# Patient Record
Sex: Male | Born: 1985 | Race: Black or African American | Hispanic: No | Marital: Single | Smoking: Never smoker
Health system: Southern US, Community
[De-identification: ages and names within clinical notes are randomized; demographics above are authoritative.]

## PROBLEM LIST (undated history)

## (undated) DIAGNOSIS — F419 Anxiety disorder, unspecified: Secondary | ICD-10-CM

## (undated) DIAGNOSIS — F329 Major depressive disorder, single episode, unspecified: Secondary | ICD-10-CM

## (undated) DIAGNOSIS — I10 Essential (primary) hypertension: Secondary | ICD-10-CM

## (undated) DIAGNOSIS — F32A Depression, unspecified: Secondary | ICD-10-CM

## (undated) DIAGNOSIS — J309 Allergic rhinitis, unspecified: Secondary | ICD-10-CM

## (undated) HISTORY — PX: OTHER SURGICAL HISTORY: SHX169

## (undated) HISTORY — DX: Anxiety disorder, unspecified: F41.9

## (undated) HISTORY — DX: Allergic rhinitis, unspecified: J30.9

## (undated) HISTORY — DX: Depression, unspecified: F32.A

## (undated) HISTORY — DX: Major depressive disorder, single episode, unspecified: F32.9

---
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
## 2004-01-06 ENCOUNTER — Encounter: Admission: RE | Admit: 2004-01-06 | Discharge: 2004-01-06 | Payer: Self-pay | Admitting: Family Medicine

## 2004-04-01 ENCOUNTER — Encounter: Admission: RE | Admit: 2004-04-01 | Discharge: 2004-04-01 | Payer: Self-pay | Admitting: Family Medicine

## 2004-04-22 ENCOUNTER — Encounter: Admission: RE | Admit: 2004-04-22 | Discharge: 2004-04-22 | Payer: Self-pay | Admitting: Family Medicine

## 2004-05-09 ENCOUNTER — Emergency Department (HOSPITAL_COMMUNITY): Admission: EM | Admit: 2004-05-09 | Discharge: 2004-05-09 | Payer: Self-pay | Admitting: Emergency Medicine

## 2004-10-21 ENCOUNTER — Ambulatory Visit: Payer: Self-pay | Admitting: Family Medicine

## 2004-10-22 ENCOUNTER — Ambulatory Visit: Payer: Self-pay | Admitting: Family Medicine

## 2004-11-09 ENCOUNTER — Ambulatory Visit: Payer: Self-pay | Admitting: *Deleted

## 2005-01-20 ENCOUNTER — Emergency Department (HOSPITAL_COMMUNITY): Admission: EM | Admit: 2005-01-20 | Discharge: 2005-01-20 | Payer: Self-pay | Admitting: Emergency Medicine

## 2006-04-19 ENCOUNTER — Ambulatory Visit: Payer: Self-pay | Admitting: Family Medicine

## 2007-02-09 DIAGNOSIS — J309 Allergic rhinitis, unspecified: Secondary | ICD-10-CM | POA: Insufficient documentation

## 2007-02-09 DIAGNOSIS — E669 Obesity, unspecified: Secondary | ICD-10-CM | POA: Insufficient documentation

## 2007-02-09 DIAGNOSIS — J4520 Mild intermittent asthma, uncomplicated: Secondary | ICD-10-CM | POA: Insufficient documentation

## 2007-09-20 ENCOUNTER — Emergency Department (HOSPITAL_COMMUNITY): Admission: EM | Admit: 2007-09-20 | Discharge: 2007-09-20 | Payer: Self-pay | Admitting: Emergency Medicine

## 2008-05-07 ENCOUNTER — Emergency Department (HOSPITAL_COMMUNITY): Admission: EM | Admit: 2008-05-07 | Discharge: 2008-05-07 | Payer: Self-pay | Admitting: Family Medicine

## 2008-06-04 ENCOUNTER — Ambulatory Visit: Payer: Self-pay | Admitting: Family Medicine

## 2008-07-08 ENCOUNTER — Encounter (INDEPENDENT_AMBULATORY_CARE_PROVIDER_SITE_OTHER): Payer: Self-pay | Admitting: *Deleted

## 2009-03-03 ENCOUNTER — Emergency Department (HOSPITAL_COMMUNITY): Admission: EM | Admit: 2009-03-03 | Discharge: 2009-03-04 | Payer: Self-pay | Admitting: Emergency Medicine

## 2010-03-05 ENCOUNTER — Emergency Department (HOSPITAL_COMMUNITY): Admission: EM | Admit: 2010-03-05 | Discharge: 2010-03-05 | Payer: Self-pay | Admitting: Emergency Medicine

## 2010-08-12 ENCOUNTER — Encounter: Payer: Self-pay | Admitting: Family Medicine

## 2010-09-27 ENCOUNTER — Emergency Department (HOSPITAL_COMMUNITY): Admission: EM | Admit: 2010-09-27 | Discharge: 2010-09-27 | Payer: Self-pay | Admitting: Emergency Medicine

## 2010-10-17 ENCOUNTER — Emergency Department (HOSPITAL_COMMUNITY): Admission: EM | Admit: 2010-10-17 | Discharge: 2010-10-17 | Payer: Self-pay | Admitting: Family Medicine

## 2010-10-19 ENCOUNTER — Emergency Department (HOSPITAL_COMMUNITY): Admission: EM | Admit: 2010-10-19 | Discharge: 2010-10-19 | Payer: Self-pay | Admitting: Family Medicine

## 2010-12-11 ENCOUNTER — Encounter: Payer: Self-pay | Admitting: Family Medicine

## 2010-12-11 ENCOUNTER — Ambulatory Visit: Admission: RE | Admit: 2010-12-11 | Discharge: 2010-12-11 | Payer: Self-pay | Source: Home / Self Care

## 2010-12-11 DIAGNOSIS — I10 Essential (primary) hypertension: Secondary | ICD-10-CM | POA: Insufficient documentation

## 2010-12-11 LAB — CONVERTED CEMR LAB
Calcium: 10.4 mg/dL (ref 8.4–10.5)
Creatinine, Ser: 1.29 mg/dL (ref 0.40–1.50)
Glucose, Bld: 107 mg/dL — ABNORMAL HIGH (ref 70–99)
Sodium: 138 meq/L (ref 135–145)

## 2011-01-12 NOTE — Miscellaneous (Signed)
   Clinical Lists Changes  Problems: Changed problem from ASTHMA, UNSPECIFIED (ICD-493.90) to ASTHMA, INTERMITTENT (ICD-493.90) 

## 2011-01-14 NOTE — Assessment & Plan Note (Signed)
Summary: meet new doc/need refills/bmc   Vital Signs:  Patient profile:   25 year old male Height:      72 inches Weight:      293 pounds BMI:     39.88 Temp:     98.2 degrees F oral Pulse rate:   88 / minute BP sitting:   138 / 92  (left arm) Cuff size:   large  Vitals Entered By: Garen Grams LPN (December 11, 2010 3:04 PM) CC: f/u bp, allergies Is Patient Diabetic? No Pain Assessment Patient in pain? yes     Location: headache   CC:  f/u bp and allergies.  History of Present Illness: 1. HTN:  Pt was diagnosed with hypertension a couple of months ago at [facility]  He was started on Bystolic and had been taking it but ran out a couple of weeks ago.  He doesn't check his blood pressure at home regularly.    ROS: denies chest pain, shortness of breath  2. Allergies:  He has had chronic problem with allergies.  He has taken multiple over the counter medicines like Zyrtec, Allegra, and Claritin.  All of them seem to help but he can't afford to take them on a regular basis.  He also used to be on Flonase and that helped.  His main complaints now include itchy eyes, nasal congestion.  ROS: denies sore throat, runny nose, chronic cough  Habits & Providers  Alcohol-Tobacco-Diet     Tobacco Status: never  Current Medications (verified): 1)  Flonase 50 Mcg/act  Susp (Fluticasone Propionate) .... 2 Sprays Each Nostril Daily 2)  Bystolic 5 Mg Tabs (Nebivolol Hcl) .Marland Kitchen.. 1 Tab By Mouth Daily 3)  Cetirizine Hcl 10 Mg Tabs (Cetirizine Hcl) .Marland Kitchen.. 1 Tab By Mouth Daily For Allergies  Allergies: No Known Drug Allergies  Past History:  Past Medical History: HTN Allergies  Social History: Reviewed history from 06/04/2008 and no changes required. lives with mom in [place]; graduated from [employer] May 2004; -tobacco/Etoh/ellicit drugs; 7 total sexual partners; condoms 80% of time;  currently no exercise 50 pounds after high school graduation  Review of Systems       The patient  complains of weight gain.  The patient denies fever, weight loss, and syncope.    Physical Exam  General:  Vitals reviewed.  Obese, no acute distress Eyes:  vision grossly intact.  fundoscopic exam benign. normal conjunctiva Nose:  no sinus pain or pressure.  no nasal discharge Mouth:  OP pink and moist Neck:  supple and no masses.   Lungs:  Normal respiratory effort, chest expands symmetrically. Lungs are clear to auscultation, no crackles or wheezes. Heart:  Normal rate and regular rhythm. S1 and S2 normal without gallop, murmur, click, rub or other extra sounds. Extremities:  No C/C/E Psych:  not anxious appearing and not depressed appearing.     Impression & Recommendations:  Problem # 1:  ESSENTIAL HYPERTENSION, BENIGN (ICD-401.1) Assessment New Advised patient that I would recommend an ACEI for first line treatment for hypertension.  He would rather continue with the Bystolic so will restart that.  F/U in 6 weeks to recheck.  Check BMET today His updated medication list for this problem includes:    Bystolic 5 Mg Tabs (Nebivolol hcl) .Marland Kitchen... 1 tab by mouth daily  Orders: Basic Met-[facility] ([address]08-65784) [facility]- Est  Level 4 (69629)  Problem # 2:  RHINITIS, ALLERGIC (ICD-477.9) Assessment: Deteriorated  Regular use of Zyrtec.  Will also start flonase  to help with the nasal congestion His updated medication list for this problem includes:    Flonase 50 Mcg/act Susp (Fluticasone propionate) .Marland Kitchen... 2 sprays each nostril daily    Cetirizine Hcl 10 Mg Tabs (Cetirizine hcl) .Marland Kitchen... 1 tab by mouth daily for allergies  Orders: [facility]- Est  Level 4 ([address]214)  Complete Medication List: 1)  Flonase 50 Mcg/act Susp (Fluticasone propionate) .... 2 sprays each nostril daily 2)  Bystolic 5 Mg Tabs (Nebivolol hcl) .Marland Kitchen.. 1 tab by mouth daily 3)  Cetirizine Hcl 10 Mg Tabs (Cetirizine hcl) .Marland Kitchen.. 1 tab by mouth daily for allergies  Patient Instructions: 1)  It was nice to meet you today 2)  I will restart  the Bystolic.  If it is expensive let me know because the other medicine that I was telling you about is on the $4 plan 3)  I have also sent in a prescription for Zyrtec and Flonase 4)  Please schedule a follow up appointment in 6 weeks to recheck your blood pressure Prescriptions: FLONASE 50 MCG/ACT  SUSP (FLUTICASONE PROPIONATE) 2 sprays each nostril daily  #1 x 3   Entered and Authorized by:   Angelena Sole MD   Signed by:   Angelena Sole MD on 12/11/2010   Method used:   Electronically to        [facility] [phone]* (retail)       [address]       [place], [place]  [address]       Ph: [phone]       Fax: [phone]   RxID:   [id number] CETIRIZINE HCL 10 MG TABS (CETIRIZINE HCL) 1 tab by mouth daily for allergies  #30 x 3   Entered and Authorized by:   Angelena Sole MD   Signed by:   Angelena Sole MD on 12/11/2010   Method used:   Electronically to        [facility] [phone]* (retail)       [address]       [place], [place]  [address]       Ph: [phone]       Fax: [phone]   RxID:   [id number] BYSTOLIC 5 MG TABS (NEBIVOLOL HCL) 1 tab by mouth daily  #30 x 3   Entered and Authorized by:   Angelena Sole MD   Signed by:   Angelena Sole MD on 12/11/2010   Method used:   Electronically to        [facility] [phone]* (retail)       [address]       [place], [place]  [address]       Ph: [phone]       Fax: [phone]   RxID:   [id number]    Orders Added: 1)  Basic Met-[facility] [[id number]] 2)  [facility]- Est  Level 4 [02585]

## 2011-03-25 LAB — DIFFERENTIAL
Basophils Absolute: 0 10*3/uL (ref 0.0–0.1)
Eosinophils Relative: 0 % (ref 0–5)
Lymphocytes Relative: 13 % (ref 12–46)
Lymphs Abs: 1.1 10*3/uL (ref 0.7–4.0)
Neutro Abs: 6.9 10*3/uL (ref 1.7–7.7)
Neutrophils Relative %: 84 % — ABNORMAL HIGH (ref 43–77)

## 2011-03-25 LAB — POCT CARDIAC MARKERS
CKMB, poc: 1.1 ng/mL (ref 1.0–8.0)
Myoglobin, poc: 128 ng/mL (ref 12–200)
Troponin i, poc: <0.05 ng/mL (ref 0.00–0.09)

## 2011-03-25 LAB — D-DIMER, QUANTITATIVE: D-Dimer, Quant: 0.23 ug/mL-FEU (ref 0.00–0.48)

## 2011-03-25 LAB — CBC
HCT: 44 % (ref 39.0–52.0)
Platelets: 174 10*3/uL (ref 150–400)
WBC: 8.3 10*3/uL (ref 4.0–10.5)

## 2011-04-02 ENCOUNTER — Encounter: Payer: Self-pay | Admitting: Family Medicine

## 2011-04-02 ENCOUNTER — Ambulatory Visit (INDEPENDENT_AMBULATORY_CARE_PROVIDER_SITE_OTHER): Payer: BC Managed Care – PPO | Admitting: Family Medicine

## 2011-04-02 DIAGNOSIS — L02419 Cutaneous abscess of limb, unspecified: Secondary | ICD-10-CM | POA: Insufficient documentation

## 2011-04-02 DIAGNOSIS — L03119 Cellulitis of unspecified part of limb: Secondary | ICD-10-CM

## 2011-04-02 DIAGNOSIS — A6000 Herpesviral infection of urogenital system, unspecified: Secondary | ICD-10-CM | POA: Insufficient documentation

## 2011-04-02 DIAGNOSIS — J45909 Unspecified asthma, uncomplicated: Secondary | ICD-10-CM

## 2011-04-02 DIAGNOSIS — J4540 Moderate persistent asthma, uncomplicated: Secondary | ICD-10-CM

## 2011-04-02 DIAGNOSIS — I10 Essential (primary) hypertension: Secondary | ICD-10-CM

## 2011-04-02 MED ORDER — CARVEDILOL 12.5 MG PO TABS: 12.5000 mg | ORAL_TABLET | Freq: Two times a day (BID) | ORAL | Status: DC

## 2011-04-02 MED ORDER — SULFAMETHOXAZOLE-TRIMETHOPRIM 800-160 MG PO TABS: 1.0000 | ORAL_TABLET | Freq: Two times a day (BID) | ORAL | Status: AC

## 2011-04-02 MED ORDER — FLUTICASONE-SALMETEROL 100-50 MCG/DOSE IN AEPB: 1.0000 | INHALATION_SPRAY | Freq: Two times a day (BID) | RESPIRATORY_TRACT | Status: DC

## 2011-04-02 MED ORDER — ACYCLOVIR 200 MG PO CAPS: 200.0000 mg | ORAL_CAPSULE | Freq: Two times a day (BID) | ORAL | Status: AC

## 2011-04-02 MED ORDER — METHYLPREDNISOLONE SODIUM SUCC 125 MG IJ SOLR
125.0000 mg | Freq: Once | INTRAMUSCULAR | Status: AC
  Administered 2011-04-02: 125 mg via INTRAMUSCULAR

## 2011-04-02 MED ORDER — FLUTICASONE PROPIONATE HFA 110 MCG/ACT IN AERO: 2.0000 | INHALATION_SPRAY | Freq: Two times a day (BID) | RESPIRATORY_TRACT | Status: DC

## 2011-04-02 NOTE — Assessment & Plan Note (Signed)
BP elevated.  He has not been taking the Bystolic because it is too expensive.  He was tolerating a beta-blocker so will switch to Carvedilol.  F/U in 6 weeks
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
## 2011-04-02 NOTE — Patient Instructions (Addendum)
We covered a lot of things today For your asthma, you got a shot of steroids to help open you up.  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

## 2011-04-02 NOTE — Progress Notes (Signed)
  Subjective:    Patient ID: Scott Patterson, male    DOB: 09-16-1986, 25 y.o.   MRN: [id number]  HPI 1. Asthma:  Pt has had increased shortness of breath for the past 3 days.  He finds it difficult to catch his breath.  He has been using his Albuterol inhaler about 3 times a week.  He has not had to use it at night.   It started after he spent some time in close proximity with a couple of dogs.  He has also been around lots of people that smoke recently.  2. Bug bite:  He was bit behind his left knee sometime last week.  It was red, swollen and tender.  That area has since gotten better, but now he has bumps on his right thigh and has had some healing bumps on other parts of his skin  3. Genital bumps:  He has been sexually active with a male who was diagnosed with genital herpes.  He has been taking some of her medicine but it hasn't helped clear the bumps.  The bumps are vesicles on his shaft.  4. HTN:  He hasn't been taking the Bystolic because it is too expensive.  Would like to switch to something cheaper.  He doesn't check his blood pressure at home regularly.   Review of Systems Denies chest pain, pleuritic chest pain.  Denies fevers, chills.  Denies penile discharge, abdominal pain, dysuria.  Denies numbness / weakness.    Objective:   Physical Exam  Constitutional: He is oriented to person, place, and time. He appears well-nourished. No distress.  HENT:  Nose: Nose normal.  Mouth/Throat: Oropharynx is clear and moist. No oropharyngeal exudate.  Eyes: Conjunctivae are normal.  Neck: Normal range of motion. Neck supple. No JVD present. No thyromegaly present.  Cardiovascular: Normal rate, regular rhythm and normal heart sounds.   Pulmonary/Chest: Effort normal. He has no wheezes.       Decreased breath sounds throughout.  No crackles or consolidation.  Abdominal: Soft. He exhibits no distension.  Genitourinary:       Genital vesicles most prominent on shaft of penis    Musculoskeletal: He exhibits no edema.  Lymphadenopathy:    He has no cervical adenopathy.  Neurological: He is alert and oriented to person, place, and time.  Skin: He is not diaphoretic.       Small areas that are red, warm, and tender.  Worse on the right thigh.  Nothing that could be drained.  Psychiatric:       Not anxious          Assessment & Plan:

## 2011-04-02 NOTE — Assessment & Plan Note (Signed)
Exam and history c/w genital herpes.  There is no large vesicle that can be unroofed and cultured.  Will treat outbreak with Acyclovir.

## 2011-05-01 ENCOUNTER — Inpatient Hospital Stay (INDEPENDENT_AMBULATORY_CARE_PROVIDER_SITE_OTHER)
Admission: RE | Admit: 2011-05-01 | Discharge: 2011-05-01 | Disposition: A | Discharge status: Home / Self Care | Payer: BC Managed Care – PPO | Source: Ambulatory Visit | Attending: Family Medicine | Admitting: Family Medicine

## 2011-05-01 DIAGNOSIS — L738 Other specified follicular disorders: Secondary | ICD-10-CM

## 2011-05-01 DIAGNOSIS — L678 Other hair color and hair shaft abnormalities: Secondary | ICD-10-CM

## 2011-10-04 ENCOUNTER — Ambulatory Visit (INDEPENDENT_AMBULATORY_CARE_PROVIDER_SITE_OTHER): Payer: Managed Care, Other (non HMO) | Admitting: Family Medicine

## 2011-10-04 VITALS — BP 126/85 | HR 84 | Temp 98.6°F | Ht 73.0 in | Wt 300.0 lb

## 2011-10-04 DIAGNOSIS — T6391XA Toxic effect of contact with unspecified venomous animal, accidental (unintentional), initial encounter: Secondary | ICD-10-CM

## 2011-10-04 DIAGNOSIS — IMO0002 Reserved for concepts with insufficient information to code with codable children: Secondary | ICD-10-CM | POA: Insufficient documentation

## 2011-10-04 DIAGNOSIS — T148XXA Other injury of unspecified body region, initial encounter: Secondary | ICD-10-CM

## 2011-10-04 DIAGNOSIS — T63441A Toxic effect of venom of bees, accidental (unintentional), initial encounter: Secondary | ICD-10-CM

## 2011-10-04 DIAGNOSIS — T63481A Toxic effect of venom of other arthropod, accidental (unintentional), initial encounter: Secondary | ICD-10-CM

## 2011-10-04 MED ORDER — DIPHENHYDRAMINE HCL 12.5 MG/5ML PO LIQD
25.0000 mg | Freq: Once | ORAL | Status: AC
  Administered 2011-10-04: 25 mg via ORAL

## 2011-10-04 NOTE — Assessment & Plan Note (Signed)
Patient with recent insect bite or sting.   No signs of respiratory distress at this time. No signs of systemic reaction to bite or sting.  Reassured and Given 25 mg of diphenhydramine, warned of drowsiness. Advised of red flags.

## 2011-10-04 NOTE — Patient Instructions (Signed)
It was nice meeting you today.  You may notice some drowsiness from the benadryl.  You can continue benadryl tonight if you having swelling or itching around the area of the sting.  If you notice that you are becoming short of breath or developing swelling or hives along your entire body, please let us know or go to the ER

## 2011-10-04 NOTE — Progress Notes (Signed)
  Subjective:    Patient ID: Nyra Capes, male    DOB: 10/12/1986, 25 y.o.   MRN: [id number]  HPI  1.  Insect sting: Patient comes in today as a walk-in. Was stung by a bee approximately 20-30 minutes prior walking in the clinic. Is concerned because he had a severe reaction to bee sting when he was a child, with shortness of breath And had to get to the hospital. He states he did not see bee. He says he did remove the stiner. He has noticed localized swelling. He felt that he initially noticed some shortness of breath But that has since went away. He has not noticed any swelling elsewhere or any hives.   Review of Systems     Objective:   Physical Exam  Constitutional: He appears well-developed and well-nourished. No distress.  Cardiovascular: Normal rate, regular rhythm and normal heart sounds.   Pulmonary/Chest: Effort normal and breath sounds normal. No respiratory distress. He has no wheezes.  Skin: Skin is warm and dry. No rash noted.       Small erythematous swollen area on L lateral neck.  Area is non-tender and consistent with insect bite/sting          Assessment & Plan:

## 2011-10-13 ENCOUNTER — Encounter: Payer: Self-pay | Admitting: Family Medicine

## 2011-10-13 ENCOUNTER — Ambulatory Visit (INDEPENDENT_AMBULATORY_CARE_PROVIDER_SITE_OTHER): Payer: Managed Care, Other (non HMO) | Admitting: Family Medicine

## 2011-10-13 DIAGNOSIS — H109 Unspecified conjunctivitis: Secondary | ICD-10-CM | POA: Insufficient documentation

## 2011-10-13 DIAGNOSIS — H5710 Ocular pain, unspecified eye: Secondary | ICD-10-CM

## 2011-10-13 MED ORDER — AZELASTINE HCL 0.05 % OP SOLN: 1.0000 [drp] | Freq: Two times a day (BID) | OPHTHALMIC | Status: DC

## 2011-10-13 NOTE — Progress Notes (Signed)
  Subjective:    Patient ID: Scott Patterson, male    DOB: February 20, 1986, 25 y.o.   MRN: [id number]  HPI EYE COMPLAINT  Location: right  Onset: 2 days ago subacute onset of itching and irritation.  No trauma or foreign body.  Minimal pain.  Increased tearing but no yellow discharge  Better with: slightly with otc visine type eye drop   Symptoms Discharge: no Pain: no Photophobia: no Decreased Vision: no URI symptoms: no Itching/Allergy sxs: yes, has lots of allergies and take zyrtec regularly Glaucoma: no Recent eye surgery: no Contact lens use: no  Red Flags Trauma: no Foreign Body: no Vomiting/HA: no Halos around lights: no Chickenpox or zoster: no  PMH - had severe conjunctivitis a few years ago and saw and eye doctor.  No problems since    Review of Symptoms - see HPI  PMH - Smoking status noted.      Review of Systems     Objective:   Physical Exam  R Eye - no discharge, very slight peripheral scleral injection.   PERRL EOMI, iris round and smooth. No pain with light.  No foreign body seen.  Fundi well visualized and normal  Procedure Tetracaine ophthalmic instilled which decreased his irritation.  Flourescein strip used.  No foreign body seen, no corneal uptake. Did see an area on sclera approximately 3 oclock measuring 3 mm of increased uptake with any visible tissue disruption        Assessment & Plan:

## 2011-10-13 NOTE — Patient Instructions (Signed)
Use the eye drops twice daily  Continue to use your zyrtec daily  You should be better in 2-3 days  If you start to lose vision or the eye becomes very painful or you are not better in 3 days or if you have a lot of discharge then come back

## 2011-10-13 NOTE — Assessment & Plan Note (Signed)
His symptoms and exam are consistent with allergic conjunctivitis (primarily itching not pain, minimal discharge, irritation better with tetracaine, no corneal lesions)  The uptake of dye consistent with mild abrasion from rubbing.  Will treat for allergy and follow his symptoms.

## 2011-10-14 ENCOUNTER — Telehealth: Payer: Self-pay | Admitting: Family Medicine

## 2011-10-14 NOTE — Telephone Encounter (Signed)
Called him.  His eye is better no visual symptoms

## 2012-02-05 ENCOUNTER — Emergency Department (HOSPITAL_COMMUNITY)
Admission: EM | Admit: 2012-02-05 | Discharge: 2012-02-05 | Disposition: A | Discharge status: Home / Self Care | Payer: No Typology Code available for payment source | Attending: Emergency Medicine | Admitting: Emergency Medicine

## 2012-02-05 ENCOUNTER — Emergency Department (HOSPITAL_COMMUNITY): Payer: No Typology Code available for payment source

## 2012-02-05 ENCOUNTER — Encounter (HOSPITAL_COMMUNITY): Payer: Self-pay

## 2012-02-05 DIAGNOSIS — S161XXA Strain of muscle, fascia and tendon at neck level, initial encounter: Secondary | ICD-10-CM

## 2012-02-05 DIAGNOSIS — J45909 Unspecified asthma, uncomplicated: Secondary | ICD-10-CM | POA: Insufficient documentation

## 2012-02-05 DIAGNOSIS — S139XXA Sprain of joints and ligaments of unspecified parts of neck, initial encounter: Secondary | ICD-10-CM | POA: Insufficient documentation

## 2012-02-05 DIAGNOSIS — I10 Essential (primary) hypertension: Secondary | ICD-10-CM | POA: Insufficient documentation

## 2012-02-05 DIAGNOSIS — Y92410 Unspecified street and highway as the place of occurrence of the external cause: Secondary | ICD-10-CM | POA: Insufficient documentation

## 2012-02-05 HISTORY — DX: Essential (primary) hypertension: I10

## 2012-02-05 MED ORDER — HYDROCODONE-ACETAMINOPHEN 5-325 MG PO TABS: 1.0000 | ORAL_TABLET | ORAL | Status: AC | PRN

## 2012-02-05 MED ORDER — OXYCODONE-ACETAMINOPHEN 5-325 MG PO TABS
1.0000 | ORAL_TABLET | Freq: Once | ORAL | Status: AC
  Administered 2012-02-05: 1 via ORAL
  Filled 2012-02-05: qty 1

## 2012-02-05 MED ORDER — IBUPROFEN 600 MG PO TABS: 600.0000 mg | ORAL_TABLET | Freq: Three times a day (TID) | ORAL | Status: AC | PRN
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
## 2012-09-07 ENCOUNTER — Ambulatory Visit (INDEPENDENT_AMBULATORY_CARE_PROVIDER_SITE_OTHER): Payer: Managed Care, Other (non HMO) | Admitting: Family Medicine

## 2012-09-07 ENCOUNTER — Other Ambulatory Visit (HOSPITAL_COMMUNITY)
Admission: RE | Admit: 2012-09-07 | Discharge: 2012-09-07 | Disposition: A | Discharge status: Home / Self Care | Payer: Managed Care, Other (non HMO) | Source: Ambulatory Visit | Attending: Family Medicine | Admitting: Family Medicine

## 2012-09-07 ENCOUNTER — Encounter: Payer: Self-pay | Admitting: Family Medicine

## 2012-09-07 VITALS — BP 138/84 | HR 88 | Temp 98.0°F | Ht 74.0 in | Wt 299.2 lb

## 2012-09-07 DIAGNOSIS — Z113 Encounter for screening for infections with a predominantly sexual mode of transmission: Secondary | ICD-10-CM | POA: Insufficient documentation

## 2012-09-07 DIAGNOSIS — R7309 Other abnormal glucose: Secondary | ICD-10-CM

## 2012-09-07 DIAGNOSIS — R358 Other polyuria: Secondary | ICD-10-CM

## 2012-09-07 DIAGNOSIS — R3589 Other polyuria: Secondary | ICD-10-CM

## 2012-09-07 DIAGNOSIS — R300 Dysuria: Secondary | ICD-10-CM

## 2012-09-07 DIAGNOSIS — R739 Hyperglycemia, unspecified: Secondary | ICD-10-CM

## 2012-09-07 LAB — POCT URINALYSIS DIPSTICK
Bilirubin, UA: NEGATIVE
Blood, UA: NEGATIVE
Glucose, UA: NEGATIVE
Spec Grav, UA: 1.015

## 2012-09-07 LAB — POCT GLYCOSYLATED HEMOGLOBIN (HGB A1C): Hemoglobin A1C: 5.9

## 2012-09-07 MED ORDER — CIPROFLOXACIN HCL 500 MG PO TABS: 500.0000 mg | ORAL_TABLET | Freq: Two times a day (BID) | ORAL | Status: DC

## 2012-09-07 NOTE — Patient Instructions (Signed)
Take the Cipro twice a day for the next 7 days.    As for supplements, don't take anything with [place] in it.

## 2012-09-08 DIAGNOSIS — R300 Dysuria: Secondary | ICD-10-CM | POA: Insufficient documentation

## 2012-09-08 NOTE — Assessment & Plan Note (Signed)
GC Chlamydia probe sent. Patient had initially a UA but did NOT wipe with alcohol swab and states this was his initial void. NOT a midstream catch. Therefore we were able to send for STD probe. We'll treat for UA currently.  If no improvement he is to call.  Will call with STD probe results.
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
## 2012-10-17 ENCOUNTER — Ambulatory Visit: Payer: Managed Care, Other (non HMO) | Admitting: Family Medicine

## 2013-01-06 ENCOUNTER — Emergency Department (HOSPITAL_BASED_OUTPATIENT_CLINIC_OR_DEPARTMENT_OTHER)
Admission: EM | Admit: 2013-01-06 | Discharge: 2013-01-06 | Disposition: A | Discharge status: Home / Self Care | Payer: Managed Care, Other (non HMO) | Attending: Emergency Medicine | Admitting: Emergency Medicine

## 2013-01-06 ENCOUNTER — Encounter (HOSPITAL_BASED_OUTPATIENT_CLINIC_OR_DEPARTMENT_OTHER): Payer: Self-pay | Admitting: *Deleted

## 2013-01-06 DIAGNOSIS — Z79899 Other long term (current) drug therapy: Secondary | ICD-10-CM | POA: Insufficient documentation

## 2013-01-06 DIAGNOSIS — J029 Acute pharyngitis, unspecified: Secondary | ICD-10-CM

## 2013-01-06 DIAGNOSIS — J45909 Unspecified asthma, uncomplicated: Secondary | ICD-10-CM | POA: Insufficient documentation

## 2013-01-06 DIAGNOSIS — I10 Essential (primary) hypertension: Secondary | ICD-10-CM | POA: Insufficient documentation

## 2013-01-06 LAB — RAPID STREP SCREEN (MED CTR MEBANE ONLY): Streptococcus, Group A Screen (Direct): NEGATIVE

## 2013-01-06 NOTE — ED Provider Notes (Signed)
History     CSN: [id number]  Arrival date & time 01/06/13  1352   First MD Initiated Contact with Patient 01/06/13 1408      Chief Complaint  Patient presents with  . Sore Throat    (Consider location/radiation/quality/duration/timing/severity/associated sxs/prior treatment) HPI  Patient complaining of sore throat for two days.  He states he had uri symptoms earlier in the week which have resolved.  Patient denies fever, chills, nausea, or vomiting.    Past Medical History  Diagnosis Date  . Asthma   . Hypertension     History reviewed. No pertinent past surgical history.  History reviewed. No pertinent family history.  History  Substance Use Topics  . Smoking status: Never Smoker   . Smokeless tobacco: Not on file  . Alcohol Use: No      Review of Systems  All other systems reviewed and are negative.    Allergies  Review of patient's allergies indicates no known allergies.  Home Medications   Current Outpatient Rx  Name  Route  Sig  Dispense  Refill  . CETIRIZINE HCL 10 MG PO TABS   Oral   Take 10 mg by mouth daily.           Marland Kitchen HYDROCHLOROTHIAZIDE 12.5 MG PO CAPS   Oral   Take 12.5 mg by mouth daily.         Marland Kitchen CARVEDILOL 12.5 MG PO TABS   Oral   Take 12.5 mg by mouth 2 (two) times daily.         Marland Kitchen CIPROFLOXACIN HCL 500 MG PO TABS   Oral   Take 1 tablet (500 mg total) by mouth 2 (two) times daily.   14 tablet   0   . FLUTICASONE PROPIONATE  HFA 110 MCG/ACT IN AERO   Inhalation   Inhale 2 puffs into the lungs 2 (two) times daily as needed. For shortness of breath           BP 123/86  Pulse 86  Temp 98 F (36.7 C) (Oral)  Resp 20  Ht 6\' 2"  (1.88 m)  Wt 279 lb 4 oz (126.667 kg)  BMI 35.85 kg/m2  SpO2 99%  Physical Exam  Nursing note and vitals reviewed. Constitutional: He is oriented to person, place, and time. He appears well-developed and well-nourished.  HENT:  Head: Normocephalic and atraumatic.  Right Ear: External ear  normal.  Left Ear: External ear normal.       Pharyngeal erythema, no exudate or swelling.   Eyes: Conjunctivae normal and EOM are normal. Pupils are equal, round, and reactive to light.  Cardiovascular: Normal rate and regular rhythm.   Pulmonary/Chest: Effort normal and breath sounds normal.  Abdominal: Soft. Bowel sounds are normal.  Musculoskeletal: Normal range of motion.  Neurological: He is alert and oriented to person, place, and time. He has normal reflexes.  Skin: Skin is warm and dry.  Psychiatric: He has a normal mood and affect. His behavior is normal. Judgment and thought content normal.    ED Course  Procedures (including critical care time)   Labs Reviewed  RAPID STREP SCREEN   No results found.   No diagnosis found.   Results for orders placed during the hospital encounter of 01/06/13  RAPID STREP SCREEN      Component Value Range   Streptococcus, Group A Screen (Direct) NEGATIVE  NEGATIVE    MDM  Pharyngitis with uri prodrome and negative strep screen.  Plan conservative therapy with salt  gargles, tylenol, and other symptomatic management.        Hilario Quarry, MD 01/06/13 1440

## 2013-01-06 NOTE — ED Notes (Signed)
Sore throat since yesterday.

## 2013-07-02 ENCOUNTER — Emergency Department (HOSPITAL_BASED_OUTPATIENT_CLINIC_OR_DEPARTMENT_OTHER): Payer: Managed Care, Other (non HMO)

## 2013-07-02 ENCOUNTER — Emergency Department (HOSPITAL_COMMUNITY): Payer: Managed Care, Other (non HMO)

## 2013-07-02 ENCOUNTER — Emergency Department (HOSPITAL_BASED_OUTPATIENT_CLINIC_OR_DEPARTMENT_OTHER)
Admission: EM | Admit: 2013-07-02 | Discharge: 2013-07-02 | Disposition: A | Discharge status: Other Acute Inpatient Hospital | Payer: Managed Care, Other (non HMO) | Attending: Emergency Medicine | Admitting: Emergency Medicine

## 2013-07-02 ENCOUNTER — Encounter (HOSPITAL_BASED_OUTPATIENT_CLINIC_OR_DEPARTMENT_OTHER): Payer: Self-pay | Admitting: *Deleted

## 2013-07-02 ENCOUNTER — Encounter (HOSPITAL_COMMUNITY): Payer: Self-pay | Admitting: Emergency Medicine

## 2013-07-02 ENCOUNTER — Emergency Department (HOSPITAL_COMMUNITY)
Admission: EM | Admit: 2013-07-02 | Discharge: 2013-07-02 | Disposition: A | Discharge status: Home / Self Care | Payer: Managed Care, Other (non HMO) | Source: Home / Self Care | Attending: Emergency Medicine | Admitting: Emergency Medicine

## 2013-07-02 DIAGNOSIS — M6281 Muscle weakness (generalized): Secondary | ICD-10-CM | POA: Insufficient documentation

## 2013-07-02 DIAGNOSIS — IMO0002 Reserved for concepts with insufficient information to code with codable children: Secondary | ICD-10-CM | POA: Insufficient documentation

## 2013-07-02 DIAGNOSIS — S139XXA Sprain of joints and ligaments of unspecified parts of neck, initial encounter: Secondary | ICD-10-CM | POA: Insufficient documentation

## 2013-07-02 DIAGNOSIS — I10 Essential (primary) hypertension: Secondary | ICD-10-CM | POA: Insufficient documentation

## 2013-07-02 DIAGNOSIS — Y92410 Unspecified street and highway as the place of occurrence of the external cause: Secondary | ICD-10-CM | POA: Insufficient documentation

## 2013-07-02 DIAGNOSIS — S0990XA Unspecified injury of head, initial encounter: Secondary | ICD-10-CM | POA: Insufficient documentation

## 2013-07-02 DIAGNOSIS — S0993XA Unspecified injury of face, initial encounter: Secondary | ICD-10-CM | POA: Insufficient documentation

## 2013-07-02 DIAGNOSIS — Y9389 Activity, other specified: Secondary | ICD-10-CM | POA: Insufficient documentation

## 2013-07-02 DIAGNOSIS — S199XXA Unspecified injury of neck, initial encounter: Secondary | ICD-10-CM | POA: Insufficient documentation

## 2013-07-02 DIAGNOSIS — J45909 Unspecified asthma, uncomplicated: Secondary | ICD-10-CM | POA: Insufficient documentation

## 2013-07-02 DIAGNOSIS — Z79899 Other long term (current) drug therapy: Secondary | ICD-10-CM | POA: Insufficient documentation

## 2013-07-02 DIAGNOSIS — S161XXA Strain of muscle, fascia and tendon at neck level, initial encounter: Secondary | ICD-10-CM

## 2013-07-02 DIAGNOSIS — R29898 Other symptoms and signs involving the musculoskeletal system: Secondary | ICD-10-CM

## 2013-07-02 MED ORDER — HYDROCODONE-ACETAMINOPHEN 5-325 MG PO TABS
2.0000 | ORAL_TABLET | Freq: Once | ORAL | Status: AC
  Administered 2013-07-02: 2 via ORAL
  Filled 2013-07-02: qty 2

## 2013-07-02 MED ORDER — HYDROCODONE-ACETAMINOPHEN 5-325 MG PO TABS: 1.0000 | ORAL_TABLET | Freq: Four times a day (QID) | ORAL | Status: DC | PRN
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
## 2013-07-02 NOTE — ED Notes (Signed)
[facility] transporting to [facility] ED for MRI and Dr. Georgiann Mccoy is accepting.

## 2013-07-03 ENCOUNTER — Emergency Department (HOSPITAL_COMMUNITY)
Admission: EM | Admit: 2013-07-03 | Discharge: 2013-07-03 | Disposition: A | Discharge status: Home / Self Care | Payer: Managed Care, Other (non HMO) | Attending: Emergency Medicine | Admitting: Emergency Medicine

## 2013-07-03 ENCOUNTER — Encounter (HOSPITAL_COMMUNITY): Payer: Self-pay | Admitting: Emergency Medicine

## 2013-07-03 DIAGNOSIS — S4980XA Other specified injuries of shoulder and upper arm, unspecified arm, initial encounter: Secondary | ICD-10-CM | POA: Insufficient documentation

## 2013-07-03 DIAGNOSIS — S161XXD Strain of muscle, fascia and tendon at neck level, subsequent encounter: Secondary | ICD-10-CM

## 2013-07-03 DIAGNOSIS — S139XXA Sprain of joints and ligaments of unspecified parts of neck, initial encounter: Secondary | ICD-10-CM | POA: Insufficient documentation

## 2013-07-03 DIAGNOSIS — Y92410 Unspecified street and highway as the place of occurrence of the external cause: Secondary | ICD-10-CM | POA: Insufficient documentation

## 2013-07-03 DIAGNOSIS — I10 Essential (primary) hypertension: Secondary | ICD-10-CM | POA: Insufficient documentation

## 2013-07-03 DIAGNOSIS — IMO0002 Reserved for concepts with insufficient information to code with codable children: Secondary | ICD-10-CM | POA: Insufficient documentation

## 2013-07-03 DIAGNOSIS — J45909 Unspecified asthma, uncomplicated: Secondary | ICD-10-CM | POA: Insufficient documentation

## 2013-07-03 DIAGNOSIS — M549 Dorsalgia, unspecified: Secondary | ICD-10-CM

## 2013-07-03 DIAGNOSIS — Y939 Activity, unspecified: Secondary | ICD-10-CM | POA: Insufficient documentation

## 2013-07-03 DIAGNOSIS — Z79899 Other long term (current) drug therapy: Secondary | ICD-10-CM | POA: Insufficient documentation

## 2013-07-03 DIAGNOSIS — S46909A Unspecified injury of unspecified muscle, fascia and tendon at shoulder and upper arm level, unspecified arm, initial encounter: Secondary | ICD-10-CM | POA: Insufficient documentation

## 2013-07-03 MED ORDER — CYCLOBENZAPRINE HCL 10 MG PO TABS: 10.0000 mg | ORAL_TABLET | Freq: Two times a day (BID) | ORAL | Status: DC | PRN

## 2013-07-03 MED ORDER — IBUPROFEN 800 MG PO TABS: 800.0000 mg | ORAL_TABLET | Freq: Three times a day (TID) | ORAL | Status: DC | PRN
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
## 2013-07-05 NOTE — ED Provider Notes (Signed)
Medical screening examination/treatment/procedure(s) were performed by non-physician practitioner and as supervising physician I was immediately available for consultation/collaboration.   Bernette Seeman, MD 07/05/13 0731 

## 2013-07-06 ENCOUNTER — Encounter (HOSPITAL_BASED_OUTPATIENT_CLINIC_OR_DEPARTMENT_OTHER): Payer: Self-pay | Admitting: *Deleted

## 2013-07-06 ENCOUNTER — Emergency Department (HOSPITAL_BASED_OUTPATIENT_CLINIC_OR_DEPARTMENT_OTHER)
Admission: EM | Admit: 2013-07-06 | Discharge: 2013-07-06 | Disposition: A | Discharge status: Home / Self Care | Payer: Managed Care, Other (non HMO) | Attending: Emergency Medicine | Admitting: Emergency Medicine

## 2013-07-06 DIAGNOSIS — Z79899 Other long term (current) drug therapy: Secondary | ICD-10-CM | POA: Insufficient documentation

## 2013-07-06 DIAGNOSIS — F0781 Postconcussional syndrome: Secondary | ICD-10-CM | POA: Insufficient documentation

## 2013-07-06 DIAGNOSIS — G8911 Acute pain due to trauma: Secondary | ICD-10-CM | POA: Insufficient documentation

## 2013-07-06 DIAGNOSIS — I10 Essential (primary) hypertension: Secondary | ICD-10-CM | POA: Insufficient documentation

## 2013-07-06 DIAGNOSIS — J45909 Unspecified asthma, uncomplicated: Secondary | ICD-10-CM | POA: Insufficient documentation

## 2013-07-06 MED ORDER — HYDROCODONE-ACETAMINOPHEN 5-325 MG PO TABS: 1.0000 | ORAL_TABLET | Freq: Four times a day (QID) | ORAL | Status: DC | PRN

## 2013-07-06 MED ORDER — CYCLOBENZAPRINE HCL 10 MG PO TABS: 10.0000 mg | ORAL_TABLET | Freq: Two times a day (BID) | ORAL | Status: DC | PRN

## 2013-07-06 NOTE — ED Provider Notes (Signed)
History/physical exam/procedure(s) were performed by non-physician practitioner and as supervising physician I was immediately available for consultation/collaboration. I have reviewed all notes and am in agreement with care and plan.   Hilario Quarry, MD 07/06/13 [phone]
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
## 2014-01-10 ENCOUNTER — Encounter (HOSPITAL_COMMUNITY): Payer: Self-pay

## 2014-01-10 ENCOUNTER — Encounter (HOSPITAL_COMMUNITY): Payer: Managed Care, Other (non HMO) | Attending: Psychiatry | Admitting: Psychiatry

## 2014-01-10 DIAGNOSIS — F411 Generalized anxiety disorder: Secondary | ICD-10-CM

## 2014-01-10 DIAGNOSIS — F331 Major depressive disorder, recurrent, moderate: Secondary | ICD-10-CM

## 2014-01-10 MED ORDER — PAROXETINE HCL ER 25 MG PO TB24: 25.0000 mg | ORAL_TABLET | Freq: Every day | ORAL | Status: DC

## 2014-01-10 NOTE — Progress Notes (Unsigned)
    Daily Group Progress Note  Program: {CHL AMB BH IOP/CDIOP Program Type:21022744}  Group Time:   Participation Level: {CHL AMB BH Group Participation:21022742}  Behavioral Response: {CHL AMB BH Group Behavior:21022743}  Type of Therapy:  {CHL AMB BH Type of Therapy:21022741}  Summary of Progress: ***     Group Time:   Participation Level:  {CHL AMB BH Group Participation:21022742}  Behavioral Response: {CHL AMB BH Group Behavior:21022743}  Type of Therapy: {CHL AMB BH Type of Therapy:21022741}  Summary of Progress: ***  [facility] 

## 2014-01-10 NOTE — Progress Notes (Signed)
Patient ID: Scott CapesRemus L Preble, male   DOB: 10-Jun-1986, 28 y.o.   MRN: [id number] D:  This is a 28 yr old, single, African American, male who was referred per Dr. Evelene CroonKaur, treatment for worsening depressive and anxiety symptoms.  Denies SI, HI, or A/V hallucinations.  Admits to paranoid thoughts.  Reports that he feels that people are talking about him.  Denies any previous suicide attempts or gestures.  No prior inpatient psychiatric hospitalizations.  Has seen Dr. Evelene CroonKaur and therapist Garry Heater([facility]) for four visits.  Adds he has seen a therapist previously years ago.  Family Hx:  Father has a long hx of drug and ETOH use, Paternal Uncle struggles with ETOH, and mother suffers with depression.  Triggers/Stressors:  1)  Job ([employer]) of three years.  Pt works within [employer].  He states it's difficult working with the public.  "We get a portfolio and is required to keep in contact with our customers.  I have had a lady to tell me that she will shoot herself so that I can hear it, if I called her again."  Patient hasn't worked since the end of October 2014.  2)  Financial Strain  3)  Conflictual Relationship with father.  "My dad has never been involved in my life."  4)  Mother:  Pt states he worries about her because she has multiple medical issues.  5)  Medical Issues of his own:  HTN and Asthma Childhood:  [place]  Denies any abuse.  "I had to become a man at a young age."  Parents never married.  States that mother worked all the time in order to provide for them.  Pt states he had a lot of responsibilities.  "I attended school, but didn't put much effort in it.  I can remember going to school worrying about bills when I was in the seventh and eighth grade."  Pt did graduate from high school and attended college for one year. Siblings:  Younger brother.  Recently moved out of mother's home. Kids:  Two daughters (ages 2 and 756). Reports that his mother is his support  system. Denies any drugs or ETOH issues currently or in the past.  No cigarettes. Denies any legal issues. Pt completed all forms.  Scored 29 on the burns.  Pt will attend for ten days.  A:  Oriented pt.  Provided pt with an orientation folder.  Informed Dr. Evelene CroonKaur and [facility], [facility] of [facility].  Inquired if pt or if he's been around anyone who had been to [place] within the past 21 days.  Informed pt to not attend if experiencing any flu-like symptoms.  Encouraged support groups.  R:  Pt receptive.

## 2014-01-11 ENCOUNTER — Encounter (HOSPITAL_COMMUNITY): Payer: Managed Care, Other (non HMO) | Admitting: Psychiatry

## 2014-01-11 DIAGNOSIS — F329 Major depressive disorder, single episode, unspecified: Secondary | ICD-10-CM

## 2014-01-11 DIAGNOSIS — F32A Depression, unspecified: Secondary | ICD-10-CM

## 2014-01-11 NOTE — Progress Notes (Signed)
    Daily Group Progress Note  Program: IOP  Group Time: 9:00-10:30 am   Participation Level: Active  Behavioral Response: Appropriate  Type of Therapy:  Process Group  Summary of Progress: Today was Pts first full day in the group. He was oriented to the group process and introduced to group members. Pt shared primary stressor of work and processed feelings associated with working for a job that does not match his values on how to treat people. Pt said he does not plan to stay in this position long term due to it causing him stress and depression and hopes to learn tools within the group that will help him better cope and be able to return to his job in the meantime. Pt said he felt grateful to be a member of the group after sharing and said he was anxious at the beginning and no longer is.      Group Time: 10:30 am - 12:00 pm   Participation Level:  Active  Behavioral Response: Appropriate  Type of Therapy: Psycho-education Group  Summary of Progress: Pt was educated on the importance of relaxation to manage stress and anxiety symptoms. Pt practiced using Progressive Muscle Relaxation as a relaxation tool and reported a reduction in stress symptoms following.   Carman ChingENGLEHORN,Sueanne Maniaci E, LCSW

## 2014-01-14 ENCOUNTER — Encounter (HOSPITAL_COMMUNITY): Payer: Self-pay | Admitting: Psychiatry

## 2014-01-14 ENCOUNTER — Encounter (HOSPITAL_COMMUNITY): Payer: Managed Care, Other (non HMO) | Attending: Psychiatry | Admitting: Psychiatry

## 2014-01-14 DIAGNOSIS — F411 Generalized anxiety disorder: Secondary | ICD-10-CM | POA: Insufficient documentation

## 2014-01-14 DIAGNOSIS — F329 Major depressive disorder, single episode, unspecified: Secondary | ICD-10-CM

## 2014-01-14 DIAGNOSIS — F331 Major depressive disorder, recurrent, moderate: Secondary | ICD-10-CM | POA: Insufficient documentation

## 2014-01-14 DIAGNOSIS — F32A Depression, unspecified: Secondary | ICD-10-CM

## 2014-01-14 NOTE — Progress Notes (Unsigned)
   THERAPIST PROGRESS NOTE Daily Group Progress Note  Program: IOP  Group Time: 9:00-12:00   Participation Level: Active   Behavioral Response: Appropriate   Type of Therapy: Individual Therapy   Summary of Progress: Patient met with physician and case manager   Eloise Levels, Ph.D., [facility]

## 2014-01-14 NOTE — Progress Notes (Signed)
   THERAPIST PROGRESS NOTE  Group Time: 9:00-10:30 am   Participation Level: Active   Behavioral Response: Appropriate   Type of Therapy: Process Group   Summary of Progress: Pt. 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 Pt. Also discussed challenge of managing work stress and balancing responsibilities for his children.  Group Time: 10:30 am - 12:00 pm   Participation Level: Active   Behavioral Response: Appropriate   Type of Therapy: Psycho-education Group   Summary of Progress: Pt participated in Grief/Loss group facilitated by Theda BelfastBob Hamilton.      Wynonia MustyBrown, Cayde Held B, COUNS 01/14/2014

## 2014-01-15 ENCOUNTER — Encounter (HOSPITAL_COMMUNITY): Payer: Managed Care, Other (non HMO) | Admitting: Psychiatry

## 2014-01-15 ENCOUNTER — Encounter (HOSPITAL_COMMUNITY): Payer: Self-pay | Admitting: Psychiatry

## 2014-01-15 DIAGNOSIS — F329 Major depressive disorder, single episode, unspecified: Secondary | ICD-10-CM

## 2014-01-15 DIAGNOSIS — F32A Depression, unspecified: Secondary | ICD-10-CM

## 2014-01-15 NOTE — Progress Notes (Signed)
Patient ID: [name], male   DOB: 07/23/1986, 28 y.o.   MRN: [id number]  Daily Group Progress Note  Program: IOP  Group Time: 9:00-10:30 am   Participation Level: Active   Behavioral Response: Appropriate   Type of Therapy: Process Group   Summary of Progress: Pt euthymic mood, smiled and laughed appropriately. Pt. Actively participates int he group process, open to feedback and provides positive feedback for group members. Pt. Indicates desire to develop positive coping skills and learning of healthy boundaries in relationship with his mother and relationship with his supervisor.  Group Time: 10:30 am - 12:00 pm   Participation Level: Active   Behavioral Response: Appropriate   Type of Therapy: Psycho-education Group   Summary of Progress: Pt participated in skills group focused on identifying and developing healthy relationship boundaries.

## 2014-01-16 ENCOUNTER — Encounter (HOSPITAL_COMMUNITY): Payer: Self-pay | Admitting: Psychiatry

## 2014-01-16 ENCOUNTER — Encounter (HOSPITAL_COMMUNITY): Payer: Managed Care, Other (non HMO) | Admitting: Psychiatry

## 2014-01-16 DIAGNOSIS — F329 Major depressive disorder, single episode, unspecified: Secondary | ICD-10-CM

## 2014-01-16 DIAGNOSIS — F32A Depression, unspecified: Secondary | ICD-10-CM

## 2014-01-16 NOTE — Progress Notes (Signed)
    Daily Group Progress Note  Program: IOP  Group Time:  9:00-10:30  Participation Level: Active  Behavioral Response: Appropriate, Sharing and Motivated  Type of Therapy:  Group Therapy  Summary of Progress: Pt. Actively participated in therapy portion of group sharing personal experiences with making positive choices by not allowing small frustrations to make him angry. Pt. Processed emotions related to boundary setting in relationships with his mother and managers at work.     Group Time: 10:45-12:00  Participation Level:  Active  Behavioral Response: Appropriate, Sharing and Motivated  Type of Therapy: Psycho-education Group  Summary of Progress: Patient participated in part 2 of boundaries group. Pt. Discussed skills associated with identifying and enforcing healthy boundaries.  Shaune PollackBrown, Jennifer B, COUNS

## 2014-01-17 ENCOUNTER — Encounter (HOSPITAL_COMMUNITY): Payer: Managed Care, Other (non HMO) | Admitting: Psychiatry

## 2014-01-17 DIAGNOSIS — F32A Depression, unspecified: Secondary | ICD-10-CM

## 2014-01-17 DIAGNOSIS — F329 Major depressive disorder, single episode, unspecified: Secondary | ICD-10-CM

## 2014-01-17 NOTE — Progress Notes (Signed)
Patient ID: Scott Patterson, male   DOB: [birth year], 28 y.o.   MRN: [id number]  Program: IOP  Group Time: 9:00-10:30   Participation Level: Active  Behavioral Response: Alert  Type of Therapy: Group Therapy   Summary of Progress: Pt. Reports euthymic mood. Participated in AutoNationheartmath meditation. Pt. participated in discussion about grief/loss associated with loss of relationship with his significant other.   Group Time: 10:30-12:00   Participation Level: Active   Behavioral Response: Alert,  Appropriate and Sharing   Type of Therapy: Psycho-education Group   Summary of Progress: Pt. Participated in discussion about creating healthy boundaries. Pt. Shared history of having loose boundaries in previous relationship and current fears of vulnerability. Boneta LucksJennifer Gentile, Ph.D., [facility]

## 2014-01-18 ENCOUNTER — Telehealth (HOSPITAL_COMMUNITY): Payer: Self-pay | Admitting: Psychiatry

## 2014-01-18 ENCOUNTER — Encounter (HOSPITAL_COMMUNITY): Payer: Managed Care, Other (non HMO)

## 2014-01-21 ENCOUNTER — Encounter (HOSPITAL_COMMUNITY): Payer: Self-pay | Admitting: Psychiatry

## 2014-01-21 ENCOUNTER — Encounter (HOSPITAL_COMMUNITY): Payer: Managed Care, Other (non HMO) | Admitting: Psychiatry

## 2014-01-21 DIAGNOSIS — F329 Major depressive disorder, single episode, unspecified: Secondary | ICD-10-CM

## 2014-01-21 DIAGNOSIS — F32A Depression, unspecified: Secondary | ICD-10-CM

## 2014-01-21 NOTE — Progress Notes (Signed)
    Daily Group Progress Note  Program: IOP  Group Time: 9:00-10:30  Participation Level: Minimal  Behavioral Response: Appropriate  Type of Therapy:  Group Therapy  Summary of Progress: Pt. Arrived for group 20 minutes late. Pt. Appeared lethargic and reported that he was sleepy/low energy. Pt. Did not participated in group discussion about triggers, self-care behaviors, and managing symptoms of depression.     Group Time: 10:30-12:00  Participation Level:  Active  Behavioral Response: Appropriate  Type of Therapy: Psycho-education Group  Summary of Progress: Pt. Participated in grief/loss group facilitated by Precious GildingBob Hamilton  Flannagan, Wyatt Thorstenson B, COUNS

## 2014-01-22 ENCOUNTER — Encounter (HOSPITAL_COMMUNITY): Payer: Managed Care, Other (non HMO) | Admitting: Psychiatry

## 2014-01-22 ENCOUNTER — Encounter (HOSPITAL_COMMUNITY): Payer: Self-pay | Admitting: Psychiatry

## 2014-01-22 DIAGNOSIS — F32A Depression, unspecified: Secondary | ICD-10-CM

## 2014-01-22 DIAGNOSIS — F329 Major depressive disorder, single episode, unspecified: Secondary | ICD-10-CM

## 2014-01-22 NOTE — Progress Notes (Signed)
    Daily Group Progress Note  Program: IOP  Group Time: 9:00-10:30  Participation Level: Active  Behavioral Response: Appropriate and Sharing  Type of Therapy:  Group Therapy  Summary of Progress:  Pt. Presented as sleepy, lethargic. Pt. Reports that he sleeps approximately 3 1/2 hours a night. Discussed the need for a consistent sleep schedule and the need for a minimum of 5 hours of sleep for physical and mental health. Pt. Discussed loss of partner relationship and process of grieving the loss, healing, and beginning to consider the possibility of a new relationship.     Group Time:  10:30-12:00  Participation Level:  Absent  Behavioral Response:  n/a  Type of Therapy: Psycho-education Group  Summary of Progress:  Pt. Reported that he would not be able to attend the skills portion of group because he received a phone call from his daughter's school that required his attention.  Shaune PollackBrown, Jennifer B, COUNS

## 2014-01-23 ENCOUNTER — Encounter (HOSPITAL_COMMUNITY): Payer: Managed Care, Other (non HMO)

## 2014-01-24 ENCOUNTER — Encounter (HOSPITAL_COMMUNITY): Payer: Managed Care, Other (non HMO) | Admitting: Psychiatry

## 2014-01-24 ENCOUNTER — Encounter (HOSPITAL_COMMUNITY): Payer: Self-pay | Admitting: Psychiatry

## 2014-01-24 DIAGNOSIS — F329 Major depressive disorder, single episode, unspecified: Secondary | ICD-10-CM

## 2014-01-24 DIAGNOSIS — F32A Depression, unspecified: Secondary | ICD-10-CM

## 2014-01-24 NOTE — Progress Notes (Signed)
Patient ID: Scott CapesRemus L Hannon, male   DOB: May 19, 1986, 28 y.o.   MRN: [id number]  Group Time: 9:00-10:30   Participation Level: Active   Behavioral Response: Appropriate and Sharing   Type of Therapy: Group Therapy   Summary of Progress: Pt. Continued to share job related stress. Processed themes associated with stress of meeting unattainable expectations, lack of control over outcomes and feeling undervalued at work.  Group Time: 10:30-12:00   Participation Level: Active  Behavioral Response: Appropriate and Sharing  Type of Therapy: Psycho-education Group   Summary of Progress: Pt. Participated in group facilitated by the mental health association.  Shaune PollackBrown, Zaineb Nowaczyk B, COUNS

## 2014-01-25 ENCOUNTER — Encounter (HOSPITAL_COMMUNITY): Payer: Managed Care, Other (non HMO) | Admitting: Psychiatry

## 2014-01-25 ENCOUNTER — Encounter (HOSPITAL_COMMUNITY): Payer: Self-pay | Admitting: Psychiatry

## 2014-01-25 DIAGNOSIS — F32A Depression, unspecified: Secondary | ICD-10-CM

## 2014-01-25 DIAGNOSIS — F329 Major depressive disorder, single episode, unspecified: Secondary | ICD-10-CM

## 2014-01-25 NOTE — Progress Notes (Signed)
Patient ID: Scott Patterson, male   DOB: [birth year], 28 y.o.   MRN: [id number]  Group Time: 9:00-10:30   Participation Level: Active   Behavioral Response: Appropriate and Sharing   Type of Therapy: Group Therapy   Summary of Progress: Pt. Was alert and attentive during group focused on identifying sources of anger, normalizing anger and lessons/self-learning can occur during periods of anger.  Group Time: 10:30-12:00   Participation Level: Active  Behavioral Response: Appropriate and Sharing   Type of Therapy: Psycho-education Group   Summary of Progress: Pt. Participated in group focused on identifying core beliefs. Shaune PollackBrown, Jennifer B, COUNS

## 2014-01-25 NOTE — Progress Notes (Signed)
Patient ID: Scott Patterson, male   DOB: 1986-09-21, 28 y.o.   MRN: [id number] D:  Met with pt briefly.  Reports feeling better.  States he is applying his coping skills.  "I'm learning how to manage my depression."  States it has been helpful being around other people with similar issues.  Sleeping 4-5 hours per night, appetite is improving, concentration improving and less irritable.  Denies SI/HI or A/V hallucinations.  A:  Provided pt with support.  R:  Pt receptive.

## 2014-01-28 ENCOUNTER — Encounter (HOSPITAL_COMMUNITY): Payer: Self-pay | Admitting: Psychiatry

## 2014-01-28 ENCOUNTER — Encounter (HOSPITAL_COMMUNITY): Payer: Managed Care, Other (non HMO) | Admitting: Psychiatry

## 2014-01-28 DIAGNOSIS — F329 Major depressive disorder, single episode, unspecified: Secondary | ICD-10-CM

## 2014-01-28 DIAGNOSIS — F32A Depression, unspecified: Secondary | ICD-10-CM

## 2014-01-28 NOTE — Progress Notes (Signed)
Patient ID: Scott Patterson, male   DOB: 04-28-1986, 28 y.o.   MRN: [id number]  Group Time: 9:00-10:30   Participation Level: Active   Behavioral Response: Appropriate and Sharing   Type of Therapy: Group Therapy   Summary of Progress: Pt. Came to group 15 minutes late. Pt. Participated in heartmath meditation and guided visualization. Pt. Appeared sleepy and lethargic. Pt. Reported that he felt "pretty good". Pt. Participated in group discussion about learning how to use assertiveness to have emotional needs met.  Group Time: 10:30-12:00   Participation Level: Absent  Behavioral Response: n/a  Type of Therapy: Psycho-education Group   Summary of Progress: Pt. Did not attend psychoeducational portion of group.  Nancie Neas, COUNS

## 2014-01-29 ENCOUNTER — Encounter (HOSPITAL_COMMUNITY): Payer: Managed Care, Other (non HMO)

## 2014-01-29 NOTE — Progress Notes (Signed)
Psychiatric Assessment Adult  Patient Identification:  Scott Patterson Date of Evaluation:  01/10/2014 Chief Complaint: Depression History of Chief Complaint:   28 yr old, single, African American, male who was referred per Dr. Evelene Croon, treatment for worsening depressive and anxiety symptoms. Denies SI, HI, or A/V hallucinations. Admits to paranoid thoughts. Reports that he feels that people are talking about him. Denies any previous suicide attempts or gestures. No prior inpatient psychiatric hospitalizations. Has seen Dr. Evelene Croon and therapist Garry Heater, [facility]) for four visits. Adds he has seen a therapist previously years ago. Family Hx: Father has a long hx of drug and ETOH use, Paternal Uncle struggles with ETOH, and mother suffers with depression. Triggers/Stressors: 1) Job ([employer]) of three years. Pt works within [employer]. He states it's difficult working with the public. "We get a portfolio and is required to keep in contact with our customers. I have had a lady to tell me that she will shoot herself so that I can hear it, if I called her again." Patient hasn't worked since the end of October 2014. 2) Financial Strain 3) Conflictual Relationship with father. "My dad has never been involved in my life." 4) Mother: Pt states he worries about her because she has multiple medical issues. 5) Medical Issues of his own: HTN and Asthma  Childhood: Born and raised in [place], [place]. Denies any abuse. "I had to become a man at a young age." Parents never married. States that mother worked all the time in order to provide for them. Pt states he had a lot of responsibilities. "I attended school, but didn't put much effort in it. I can remember going to school worrying about bills when I was in the seventh and eighth grade." Pt did graduate from high school and attended college for one year.  Siblings: Younger brother. Recently moved out of mother's home.  Kids: Two daughters (ages 2 and 76).   Reports that his mother is his support system.  Denies any drugs or ETOH issues currently or in the past. No cigarettes.  Denies any legal issues.           HPI Review of Systems Physical Exam  Depressive Symptoms: depressed mood, anhedonia, insomnia, psychomotor retardation, fatigue, feelings of worthlessness/guilt, difficulty concentrating, hopelessness, anxiety, weight gain, increased appetite,  (Hypo) Manic Symptoms:  None  Anxiety Symptoms: Excessive Worry:  Yes Panic Symptoms:  No Agoraphobia:  No Obsessive Compulsive: No  Symptoms: None, Specific Phobias:  No Social Anxiety:  No  Psychotic Symptoms: None  PTSD Symptoms: None Traumatic Brain Injury: No   Past Psychiatric History: Diagnosis: depression  Hospitalizations:   Outpatient Care: Dr Evelene Croon for medications and Garry Heater for therapy. Patient was given imipramine 50 mg and Seroquel 200 mg few months ago.   Substance Abuse Care:   Self-Mutilation:   Suicidal Attempts:   Violent Behaviors:    Past Medical History:   Past Medical History  Diagnosis Date  . Asthma   . Hypertension   . Depression   . Anxiety    History of Loss of Consciousness:  No Seizure History:  No Cardiac History:  No Allergies:  No Known Allergies Current Medications:  Current Outpatient Prescriptions  Medication Sig Dispense Refill  . cetirizine (ZYRTEC) 10 MG tablet Take 10 mg by mouth daily.       . diazepam (VALIUM) 10 MG tablet Take 10 mg by mouth once.      . Fluticasone-Salmeterol (ADVAIR) 100-50 MCG/DOSE AEPB  Inhale 1 puff into the lungs every 12 (twelve) hours as needed (For asthma).      . hydrochlorothiazide (MICROZIDE) 12.5 MG capsule Take 12.5 mg by mouth daily.      Marland Kitchen imipramine (TOFRANIL) 50 MG tablet Take 50 mg by mouth at bedtime.      Marland Kitchen PARoxetine (PAXIL CR) 25 MG 24 hr tablet Take 1 tablet (25 mg total) by mouth daily.  30 tablet  0   No current facility-administered medications for this visit.     Previous Psychotropic Medications:  Medication Dose   Imipramine and Seroquel                        Substance Abuse History in the last 12 months: None Substance Age of 1st Use Last Use Amount Specific Type  Nicotine      Alcohol      Cannabis      Opiates      Cocaine      Methamphetamines      LSD      Ecstasy      Benzodiazepines      Caffeine      Inhalants      Others:                          Medical Consequences of Substance Abuse:   Legal Consequences of Substance Abuse:   Family Consequences of Substance Abuse:   Blackouts:  No DT's:  No Withdrawal Symptoms:  No None  Social History: Current Place of Residence: Lives in [place] of Birth:  Family Members:  Marital Status:  Single Children: 2  Sons:   Daughters:  Relationships:  Education:  HS Print production planner Problems/Performance:  Religious Beliefs/Practices:  History of Abuse: none Teacher, music History:  None. Legal History: None Hobbies/Interests:   Family History:   Family History  Problem Relation Age of Onset  . Depression Mother   . Alcohol abuse Father   . Drug abuse Father   . Alcohol abuse Paternal Uncle     Mental Status Examination/Evaluation: Objective:  Appearance: Casual  Eye Contact::  Minimal  Speech:  Clear and Coherent and Normal Rate  Volume:  Decreased  Mood:  Depressed and anxious   Affect:  Constricted and Depressed  Thought Process:  Goal Directed, Linear and Logical  Orientation:  Full (Time, Place, and Person)  Thought Content:  Rumination  Suicidal Thoughts:  No  Homicidal Thoughts:  No  Judgement:  Fair  Insight:  Fair  Psychomotor Activity:  Normal  Akathisia:  No  Handed:  Right  AIMS (if indicated):  0  Assets:  Communication Skills Desire for Improvement Physical Health Resilience Social Support Transportation Vocational/Educational    Laboratory/X-Ray Psychological Evaluation(s)         Assessment:    AXIS I Generalized Anxiety Disorder and Major Depression, Recurrent severe  AXIS II Cluster C Traits  AXIS III Past Medical History  Diagnosis Date  . Asthma   . Hypertension   . Depression   . Anxiety      AXIS IV economic problems, occupational problems, problems related to social environment and problems with primary support group  AXIS V 51-60 moderate symptoms   Treatment Plan/Recommendations:  Plan of Care: Start IOP.   Laboratory:  none  Psychotherapy: Group and individual therapy   Medications: Discussed rationale risks benefits options off Paxil CR 25 mg and patient gave informed  consent. Patient will continue his Valium and imipramine at the present time but will discontinue his Seroquel. Patient will continue his other medications.   Routine PRN Medications:  Yes  Consultations: None   Safety Concerns:  None   Other:  Estimated length of stay 2 weeks     Margit Bandaadepalli, Shivansh Hardaway, MD 2/17/20155:11 PM

## 2014-01-30 ENCOUNTER — Encounter (HOSPITAL_COMMUNITY): Payer: Managed Care, Other (non HMO) | Admitting: Psychiatry

## 2014-01-30 DIAGNOSIS — F329 Major depressive disorder, single episode, unspecified: Secondary | ICD-10-CM

## 2014-01-30 DIAGNOSIS — F32A Depression, unspecified: Secondary | ICD-10-CM

## 2014-01-30 NOTE — Progress Notes (Signed)
Patient ID: Scott Patterson, male   DOB: September 04, 1986, 28 y.o.   MRN: [id number] The patient was seen face-to-face today, states that she's tolerating his medications well and is currently on imipramine 25 mg. Reports that his sleep tends to fluctuate and sleep hygiene was discussed with him. Mood also is mildly dysphoric but is gradually improving continues to be anxious although states that his energy is improving. Denies suicidal ideation has no homicidal ideation and no hallucinations or delusions. Discussed discontinuing imipramine. patient stated understanding. Continue Paxil CR 25 and Valium and his other medications.

## 2014-01-31 ENCOUNTER — Encounter (HOSPITAL_COMMUNITY): Payer: Self-pay | Admitting: Psychiatry

## 2014-01-31 ENCOUNTER — Encounter (HOSPITAL_COMMUNITY): Payer: Managed Care, Other (non HMO) | Admitting: Psychiatry

## 2014-01-31 DIAGNOSIS — F329 Major depressive disorder, single episode, unspecified: Secondary | ICD-10-CM

## 2014-01-31 DIAGNOSIS — F32A Depression, unspecified: Secondary | ICD-10-CM

## 2014-01-31 NOTE — Progress Notes (Signed)
Patient ID: Scott Patterson, male   DOB: October 14, 1986, 28 y.o.   MRN: [id number] Group Time: 9:00-10:30   Participation Level: Active   Behavioral Response: Appropriate and Sharing   Type of Therapy: Group Therapy   Summary of Progress:  Pt. Participated in heartmath meditation and guided visualization. Pt. Continues to feel sleepy and lethargic. Pt. Did not join in discussion about how need for validation and acknowledgement can be a roadblock to recovery.  Group Time: 10:30-12:00   Participation Level: Absent   Behavioral Response: n/a   Type of Therapy: Psycho-education Group   Summary of Progress: Pt. Did not attend psychoeducational portion of group. Pt. Reported that childcare issue that prevented him from attending group. Shaune PollackBrown, Jennifer B, COUNS

## 2014-01-31 NOTE — Progress Notes (Signed)
Patient ID: [name], male   DOB: 1986/10/20, 28 y.o.   MRN: [id number] Group Time: 9:00-10:30   Participation Level: Active   Behavioral Response: Appropriate and Sharing   Type of Therapy: Group Therapy   Summary of Progress: Pt. Participated in heartmath meditation. Pt. Participated in discussion on family and relationship issues.  Group Time: 10:30-12:00   Participation Level: Active  Behavioral Response: Appropriate and Sharing  Type of Therapy: Psycho-education Group   Summary of Progress: Pt. Participated in group focused on the wheel of life, discussion of finding life balance. Pt. Identified one area that he can focus on making his life better. Shaune PollackBrown, Jennifer B, COUNS

## 2014-02-01 ENCOUNTER — Encounter (HOSPITAL_COMMUNITY): Payer: Self-pay | Admitting: Psychiatry

## 2014-02-01 ENCOUNTER — Encounter (HOSPITAL_COMMUNITY): Payer: Managed Care, Other (non HMO) | Admitting: Psychiatry

## 2014-02-01 DIAGNOSIS — F329 Major depressive disorder, single episode, unspecified: Secondary | ICD-10-CM

## 2014-02-01 DIAGNOSIS — F32A Depression, unspecified: Secondary | ICD-10-CM

## 2014-02-01 MED ORDER — PAROXETINE HCL ER 25 MG PO TB24: 25.0000 mg | ORAL_TABLET | Freq: Every day | ORAL | Status: DC

## 2014-02-01 NOTE — Progress Notes (Signed)
Patient ID: Scott CapesRemus L Patterson, male   DOB: January 14, 1986, 28 y.o.   MRN: [id number] Pt reviewed and seen states that his sleeps poor as he tends to stay up late, watching TV. Discussed sleep hygiene and he stated understanding. Patient is tolerating his medications well states his mood is improving gradually. Appetite is good denies suicidal or homicidal ideation and has no hallucinations or delusions. Tolerating his medications well and coping well

## 2014-02-01 NOTE — Progress Notes (Signed)
    Daily Group Progress Note  Program: IOP  Group Time: 9:00-10:30   Participation Level: Active   Behavioral Response: Appropriate and Sharing   Type of Therapy: Group Therapy   Summary of Progress: Pt. Participated in heartmath meditation and guided visualization. Pt. Continues to present as lethargic, sleepy. Pt. Continues to report multiple stressors including childcare responsibility, work stress, school stress, and poor boundaries in family relationships. Pt. Discussed instances where he was able to use assertiveness and taking alternative perspectives in order to better manage stressful situations.  Group Time: 10:30-12:00   Participation Level: Active  Behavioral Response: Appropriate and Sharing  Type of Therapy: Psycho-education Group   Summary of Progress: Pt. Participated in group focused on developing a meditative practice.    Shaune PollackBrown, Jennifer B, COUNS

## 2014-02-04 ENCOUNTER — Encounter (HOSPITAL_COMMUNITY): Payer: Managed Care, Other (non HMO) | Admitting: Psychiatry

## 2014-02-04 ENCOUNTER — Encounter (HOSPITAL_COMMUNITY): Payer: Self-pay | Admitting: Psychiatry

## 2014-02-04 DIAGNOSIS — F32A Depression, unspecified: Secondary | ICD-10-CM

## 2014-02-04 DIAGNOSIS — F329 Major depressive disorder, single episode, unspecified: Secondary | ICD-10-CM

## 2014-02-04 NOTE — Progress Notes (Signed)
Patient ID: [name], male   DOB: 05-09-1986, 28 y.o.   MRN: [id number] D: This is a 28 yr old, single, African American, male who was referred per Dr. Evelene CroonKaur, treatment for worsening depressive and anxiety symptoms. Denies SI, HI, or A/V hallucinations. Denies paranoid thoughts. Denies any previous suicide attempts or gestures. No prior inpatient psychiatric hospitalizations. Has seen Dr. Evelene CroonKaur and therapist Garry Heater([facility], [facility] for four visits. Adds he has seen a therapist previously years ago. Family Hx: Father has a long hx of drug and ETOH use, Paternal Uncle struggles with ETOH, and mother suffers with depression. Triggers/Stressors: 1) Job ([employer]) of three years. Pt works within [employer] department. He states it's difficult working with the public. "We get a portfolio and is required to keep in contact with our customers. I have had a lady to tell me that she will shoot herself so that I can hear it, if I called her again." Patient hasn't worked since the end of October 2014. 2) Financial Strain 3) Conflictual Relationship with father. "My dad has never been involved in my life." 4) Mother: Pt states he worries about her because she has multiple medical issues. 5) Medical Issues of his own: HTN and Asthma  Pt completed MH-IOP today.  Reports feeling a little better.  "I'm still struggling with the depression and anxiety.  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  Wants to continue working on ways to deal with stress and his depression, with his therapist.  A:  D/C today.  F/U with Dr. Evelene CroonKaur on 03-04-14 @ 3:15pm and pt will contact Gar[facility]Judy Robbins, [facility] for an appointment.  Encouraged support groups.  R:  Pt receptive.

## 2014-02-04 NOTE — Patient Instructions (Signed)
Patient completed MH-IOP today.  Will follow up with Dr. Evelene CroonKaur on 03-04-14 @ 3:15 pm.  Patient will contact Garry HeaterJudy Robbins, [facility] for an appointment.  Encouraged support groups.

## 2014-02-04 NOTE — Progress Notes (Signed)
    Daily Group Progress Note  Program: IOP  Group Time:  9:00-10:30  Participation Level: Minimal  Behavioral Response: Appropriate  Type of Therapy:  Group Therapy  Summary of Progress: Pt. Was 25 minutes late for group. Pt. Continues to present as lethargic, sleepy. Pt. Participated in discussion about labels and how they prevent us from developing our true selves and honest relationships with others.     Group Time:  10:30-12:00  Participation Level:  Minimal  Behavioral Response: Appropriate  Type of Therapy: Psycho-education Group  Summary of Progress: Pt. Was alert and sharing during grief/loss group facilitated by Theda BelfastBob Hamilton.  Shaune PollackBrown, Antawn Sison B, COUNS

## 2014-02-05 ENCOUNTER — Encounter (HOSPITAL_COMMUNITY): Payer: Managed Care, Other (non HMO)

## 2014-02-06 ENCOUNTER — Encounter (HOSPITAL_COMMUNITY): Payer: Managed Care, Other (non HMO) | Admitting: Psychiatry

## 2014-02-07 ENCOUNTER — Encounter (HOSPITAL_COMMUNITY): Payer: Managed Care, Other (non HMO)

## 2014-02-08 NOTE — Progress Notes (Signed)
Discharge Note  Patient:  Scott CapesRemus L Riedl is an 28 y.o., male DOB:  November 17, 1986  Date of Admission:  01/10/14  Date of Discharge:  02/04/14  Reason for Admission: [age] year old African American male referred by Dr. Denyse Amassorey for increasing depression anxiety and paranoia. Patient had multiple financial stressors and was stressed out with his job.   Hospital Course: Patient started IOP and he was continued on his Valium and imipramine. His Seroquel was discontinued as he felt it was not helping him. He was started on Paxil CR 25 mg every day for his depression. Gradually his imipramine was decreased to 25 mg and then was discontinued. He tolerated the medication changes well. He attended groups and was able to participate in group therapy he learnt coping skills relaxation therapy. His mood gradually improved with decreased anxiety his sleep was better he stated that he had more energy . Patient will watch TV and stay up late and sleep hygiene was discussed with him and he stated understanding. Patient was coping well and was tolerating his medications well and was doing well in group.  Mental Status at Discharge: Alert, oriented x3, affect was full mood was euthymic speech and language was normal. Musculoskeletal  system was normal. No suicidal or homicidal ideation and he had no hallucinations or delusions. Recent and remote memory was good, judgment and insight was good, concentration and recall was good. He was coping well and tolerating his medications well.  Lab Results: No results found for this or any previous visit (from the past 48 hour(s)).  Current outpatient prescriptions:cetirizine (ZYRTEC) 10 MG tablet, Take 10 mg by mouth daily. 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 Asthma   . Hypertension   . Depression   . Anxiety    Axis IV: economic problems, occupational problems, other psychosocial or environmental problems, problems related to social environment and problems with primary support group Axis V: 61-70 mild symptoms   Level of Care:  OP  Discharge destination:  Home  Is patient on multiple antipsychotic therapies at discharge:  No    Has Patient had three or more failed trials of antipsychotic monotherapy by history:  No  Patient phone:  [phone] (home)  Patient address[place]   [address] [place] [id number],   Follow-up recommendations:  Activity:  As tolerated Diet:  Regular Other:  Followup that Dr. Evelene CroonKaur for medications and Garry HeaterJudy Robbins for therapy    The patient received suicide prevention pamphlet:  Yes   Margit Bandaadepalli, Kadience Macchi 02/08/2014, 12:39 PM

## 2014-02-08 NOTE — Addendum Note (Signed)
Addended by: Margit BandaADEPALLI, Sajjad Honea D on: 02/08/2014 12:45 PM   Modules accepted: Orders

## 2014-02-28 ENCOUNTER — Encounter (HOSPITAL_COMMUNITY): Payer: Self-pay | Admitting: Psychiatry

## 2015-01-07 ENCOUNTER — Encounter (HOSPITAL_COMMUNITY): Payer: Self-pay | Admitting: Psychiatry

## 2015-01-07 ENCOUNTER — Other Ambulatory Visit (HOSPITAL_COMMUNITY): Payer: BLUE CROSS/BLUE SHIELD | Attending: Psychiatry | Admitting: Psychiatry

## 2015-01-07 DIAGNOSIS — F332 Major depressive disorder, recurrent severe without psychotic features: Secondary | ICD-10-CM | POA: Insufficient documentation

## 2015-01-07 DIAGNOSIS — I10 Essential (primary) hypertension: Secondary | ICD-10-CM | POA: Insufficient documentation

## 2015-01-07 DIAGNOSIS — J45909 Unspecified asthma, uncomplicated: Secondary | ICD-10-CM | POA: Diagnosis not present

## 2015-01-07 DIAGNOSIS — F419 Anxiety disorder, unspecified: Secondary | ICD-10-CM | POA: Insufficient documentation

## 2015-01-07 DIAGNOSIS — F329 Major depressive disorder, single episode, unspecified: Secondary | ICD-10-CM

## 2015-01-07 DIAGNOSIS — G4700 Insomnia, unspecified: Secondary | ICD-10-CM | POA: Diagnosis not present

## 2015-01-07 DIAGNOSIS — F32A Depression, unspecified: Secondary | ICD-10-CM

## 2015-01-07 DIAGNOSIS — F331 Major depressive disorder, recurrent, moderate: Secondary | ICD-10-CM

## 2015-01-07 NOTE — Progress Notes (Signed)
Psychiatric Assessment Adult  Patient Identification:  Scott Patterson Date of Evaluation:  01/07/2015 Chief Complaint: depression and job stress History of Chief Complaint:  Mr Scott Patterson says he has been depressed because of several stressors.  His job is the biggest stress.  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  The work is far more stressful, he is ill prepared to do some of it, does not feel supported at all by his management, has tried to get transfers but nothing seems to change.  He feels overwhelmed but cannot quit because he needs the money and the benefits.  He separated from his fiancee in April of 2015 after having a second child, buying a house and a car.  He has since become the single parent of his 30 year old daughter and is supporting his 934 year old living with his ex- fiancee.  He had some job prospects that did not work out at the last minute.  Consequently, he feels demoralized and that things will not get better.  His childhood was not a warm supportive one and he is very alone in taking care of things with no support emotionally.  He is sad, feeling tired, unmotivated, having decreased energy, thoughts of giving up, trouble sleeping, eating too much and has gained 40 pounds he does not need.  Blood pressure has increased as well.  He feels guilty about not being a good enough person and father.  He was also about to finish his on line college degree but with all the stress had to stop.  He sees the degree as a step towards a way out of his stressful job.  HPI Review of Systems Physical Exam  Depressive Symptoms: depressed mood, anhedonia, insomnia, fatigue, feelings of worthlessness/guilt, difficulty concentrating, hopelessness, impaired memory, anxiety, weight gain, increased appetite,  (Hypo) Manic Symptoms:   Elevated Mood:  Negative Irritable Mood:  Yes Grandiosity:   Negative Distractibility:  Negative Labiality of Mood:  Yes Delusions:  Negative Hallucinations:  Negative Impulsivity:  Negative Sexually Inappropriate Behavior:  Negative Financial Extravagance:  Negative Flight of Ideas:  Negative  Anxiety Symptoms: Excessive Worry:  Yes Panic Symptoms:  Negative Agoraphobia:  Negative Obsessive Compulsive: Negative  Symptoms: None, Specific Phobias:  Negative Social Anxiety:  Negative  Psychotic Symptoms:  Hallucinations: Negative None Delusions:  Negative Paranoia:  Negative   Ideas of Reference:  Negative  PTSD Symptoms: Ever had a traumatic exposure:  Negative Had a traumatic exposure in the last month:  Negative Re-experiencing: Negative None Hypervigilance:  Negative Hyperarousal: Negative None Avoidance: Negative None  Traumatic Brain Injury: Negative na  Past Psychiatric History: Diagnosis: major depression, recurrent, severe, non psychotic  Hospitalizations: none  Outpatient Care: sees psychiatrist but his therapist closed her practice  Substance Abuse Care: none  Self-Mutilation: none  Suicidal Attempts: none  Violent Behaviors: none   Past Medical History:   Past Medical History  Diagnosis Date  . Asthma   . Hypertension   . Depression   . Anxiety    History of Loss of Consciousness:  Negative Seizure History:  Negative Cardiac History:  Negative Allergies:  No Known Allergies Current Medications:  Current Outpatient Prescriptions  Medication Sig Dispense Refill  . cetirizine (ZYRTEC) 10 MG tablet Take 10 mg by mouth daily.     . diazepam (VALIUM) 10 MG tablet Take 10 mg by mouth once.    . Fluticasone-Salmeterol (  ADVAIR) 100-50 MCG/DOSE AEPB Inhale 1 puff into the lungs every 12 (twelve) hours as needed (For asthma).    . hydrochlorothiazide (MICROZIDE) 12.5 MG capsule Take 12.5 mg by mouth daily.    Marland Kitchen PARoxetine (PAXIL CR) 25 MG 24 hr tablet Take 1 tablet (25 mg total) by mouth daily. 30 tablet 0   No  current facility-administered medications for this visit.    Previous Psychotropic Medications:  Medication Dose   listed above  na                     Substance Abuse History in the last 12 months:none Substance Age of 1st Use Last Use Amount Specific Type                                                                    Others:                          Medical Consequences of Substance Abuse: none  Legal Consequences of Substance Abuse: none  Family Consequences of Substance Abuse: none  Blackouts:  Negative DT's:  Negative Withdrawal Symptoms:  Negative None  Social History: Current Place of Residence: [place] Place of Birth: [place] Family Members: self and 65 year old daughter, 2 yo daughter lives with his ex-fiancee Marital Status:  Single Children: 2  Sons: 0  Daughters: 2 Relationships: no close relationships Education:  One semester short of college degree Educational Problems/Performance: good Religious Beliefs/Practices: Christian History of Abuse: none Teacher, music History:  None. Legal History: none Hobbies/Interests: none reported  Family History:   Family History  Problem Relation Age of Onset  . Depression Mother   . Alcohol abuse Father   . Drug abuse Father   . Alcohol abuse Paternal Uncle     Mental Status Examination/Evaluation: Objective:  Appearance: Well Groomed  Eye Contact::  Good  Speech:  Clear and Coherent  Volume:  Normal  Mood:  depressed  Affect:  Congruent  Thought Process:  Coherent and Logical  Orientation:  Full (Time, Place, and Person)  Thought Content:  Negative  Suicidal Thoughts:  No  Homicidal Thoughts:  No  Judgement:  Good  Insight:  Good  Psychomotor Activity:  Normal  Akathisia:  Negative  Handed:  Right  AIMS (if indicated):  0  Assets:  Communication Skills Desire for Improvement Financial  Resources/Insurance Housing Talents/Skills Transportation Vocational/Educational    Laboratory/X-Ray Psychological Evaluation(s)   none  none   Assessment:  Major depression, recurrent, severe, non psychotic                  Treatment Plan/Recommendations:  Plan of Care: group therapy daily  Laboratory:  none  Psychotherapy: group therapy  Medications: continue current medications  Routine PRN Medications:  Yes  Consultations: none  Safety Concerns:  none  Other:      Benjaman Pott, MD 1/26/201612:55 PM

## 2015-01-07 NOTE — Progress Notes (Signed)
  D: This is a 29 yr old, single, African American, male who was referred per [facility], treatment for worsening depressive and anxiety symptoms. Denies SI, HI, or A/V hallucinations. Denies any previous suicide attempts or gestures. No prior inpatient psychiatric hospitalizations. Has previously seen Dr. Evelene CroonKaur and therapist Garry Heater([facility], [facility]). Adds he has seen a therapist previously years ago. Family Hx: Father has a long hx of drug and ETOH use, Paternal Uncle struggles with ETOH, and mother suffers with depression. Triggers/Stressors: 1) Job ([employer]) of four years. Pt use to work within [employer] before being his particular department closed. He states he was demoted to another department last year.   "My performance has gone down and I've been written up.  Patient hasn't worked since November 2015. 2) Financial Strain 3) Loss of relationship with daughter's mother.  According to pt, strained relationship ended April 2015.  Pt states he is now a single parent to their 287 yr old daughter.  The 2 yr old is living with the mother.  4) Mother: Pt states he worries about her because she has multiple medical issues. 5) Medical Issues of his own: HTN and Asthma  6)  Online school [employer]  Pt states he has a year and a half to go.  Due to the stress he put it on hold. Childhood[place]. Denies any abuse. "I had to become a man at a young age." Parents never married. States that mother worked all the time in order to provide for them. Pt states he had a lot of responsibilities. "I attended school, but didn't put much effort in it. I can remember going to school worrying about bills when I was in the seventh and eighth grade." Pt did graduate from high school and attended college for one year. Siblings: Younger brother.  Kids: Two daughters (ages 2 and 7). Reports that his mother is his support system. Denies any  drugs or ETOH issues currently or in the past. No cigarettes. Denies any legal issues. Pt completed all forms. Scored 29 on the burns. Pt will attend for ten days. A: Oriented pt. Provided pt with an orientation folder. Informed Dr. Evelene CroonKaur of admit. Informed pt to not attend if experiencing any flu-like symptoms. Encouraged support groups. Will refer pt to a therapist and Vocational Rehab.  R: Pt receptive.

## 2015-01-08 ENCOUNTER — Other Ambulatory Visit (HOSPITAL_COMMUNITY): Payer: BLUE CROSS/BLUE SHIELD

## 2015-01-09 ENCOUNTER — Other Ambulatory Visit (HOSPITAL_COMMUNITY): Payer: BLUE CROSS/BLUE SHIELD | Admitting: Psychiatry

## 2015-01-09 DIAGNOSIS — F331 Major depressive disorder, recurrent, moderate: Secondary | ICD-10-CM

## 2015-01-09 DIAGNOSIS — F332 Major depressive disorder, recurrent severe without psychotic features: Secondary | ICD-10-CM | POA: Diagnosis not present

## 2015-01-09 NOTE — Progress Notes (Signed)
    Daily Group Progress Note  Program: IOP  Group Time: 9:00-10:30  Participation Level: Active  Behavioral Response: Appropriate  Type of Therapy:  Group Therapy  Summary of Progress: Pt. Met with psychiatrist and case manager.      Group Time: 10:30-12:00  Participation Level:  Active  Behavioral Response: Appropriate  Type of Therapy: Psycho-education Group  Summary of Progress: Pt. Met with psychiatrist and case manager.   Amstutz, Jennifer B, LPC 

## 2015-01-09 NOTE — Progress Notes (Signed)
    Daily Group Progress Note  Program: IOP  Group Time: 9:00-10:30  Participation Level: Active  Behavioral Response: Appropriate  Type of Therapy:  Group Therapy  Summary of Progress: Pt. Reported significant stressors related to work environment, and struggles with parenting, loss of relationship with his fiance and her family.      Group Time: 10:30-12:00  Participation Level:  Active  Behavioral Response: Appropriate  Type of Therapy: Psycho-education Group  Summary of Progress: Pt. was active in group today. Summary of Progress: Pt. shared his experiences of his work issues and not allowing himself to forget about the negative comments his coworkers are saying about him. Pt. suggested he needs family therapy. Pt. participated in a guided meditation on affirmation.    Shaune PollackBrown, Marty Uy B, LPC

## 2015-01-10 ENCOUNTER — Other Ambulatory Visit (HOSPITAL_COMMUNITY): Payer: BLUE CROSS/BLUE SHIELD | Admitting: Psychiatry

## 2015-01-10 DIAGNOSIS — F331 Major depressive disorder, recurrent, moderate: Secondary | ICD-10-CM

## 2015-01-10 DIAGNOSIS — F332 Major depressive disorder, recurrent severe without psychotic features: Secondary | ICD-10-CM | POA: Diagnosis not present

## 2015-01-10 NOTE — Progress Notes (Signed)
    Daily Group Progress Note  Program: IOP  Group Time: 9:00-10:30  Participation Level: Active  Behavioral Response: Appropriate  Type of Therapy:  Group Therapy  Summary of Progress: Pt. Appeared sleepy and lethargic. Pt. Continues to discuss major triggers are stressful work environment and parenting as a single parent.      Group Time: 10:30-12:00  Participation Level:  Active  Behavioral Response: Appropriate  Type of Therapy: Psycho-education Group  Summary of Progress: Pt. Participated in grief and loss facilitated by Theda BelfastBob Hamilton.   Shaune PollackBrown, Nadja Lina B, LPC

## 2015-01-13 ENCOUNTER — Ambulatory Visit (INDEPENDENT_AMBULATORY_CARE_PROVIDER_SITE_OTHER): Payer: BLUE CROSS/BLUE SHIELD | Admitting: Family Medicine

## 2015-01-13 ENCOUNTER — Encounter: Payer: Self-pay | Admitting: Family Medicine

## 2015-01-13 ENCOUNTER — Other Ambulatory Visit (HOSPITAL_COMMUNITY): Payer: BLUE CROSS/BLUE SHIELD | Attending: Psychiatry | Admitting: Psychiatry

## 2015-01-13 VITALS — BP 159/100 | HR 90 | Temp 98.2°F | Ht 73.0 in | Wt 288.8 lb

## 2015-01-13 DIAGNOSIS — I10 Essential (primary) hypertension: Secondary | ICD-10-CM | POA: Diagnosis not present

## 2015-01-13 DIAGNOSIS — F332 Major depressive disorder, recurrent severe without psychotic features: Secondary | ICD-10-CM | POA: Diagnosis present

## 2015-01-13 DIAGNOSIS — J45909 Unspecified asthma, uncomplicated: Secondary | ICD-10-CM | POA: Insufficient documentation

## 2015-01-13 DIAGNOSIS — F331 Major depressive disorder, recurrent, moderate: Secondary | ICD-10-CM

## 2015-01-13 MED ORDER — HYDROCHLOROTHIAZIDE 12.5 MG PO CAPS: 12.5000 mg | ORAL_CAPSULE | Freq: Every day | ORAL | Status: DC

## 2015-01-13 MED ORDER — FLUTICASONE-SALMETEROL 100-50 MCG/DOSE IN AEPB: 1.0000 | INHALATION_SPRAY | Freq: Two times a day (BID) | RESPIRATORY_TRACT | Status: DC | PRN

## 2015-01-13 NOTE — Progress Notes (Signed)
   Subjective:    Patient ID: Scott Patterson, male    DOB: 18-Dec-1985, 29 y.o.   MRN: [id number]  HPI: Pt presents to clinic to discuss his HTN (on HCTZ) and possibly a referral to the cardiologist. He has been on HCTZ for a few years and reports compliance; he does need a refill. He has been on other medicines in the past, Bystolic he thinks. He will occasionally feel tightness in his chest, "a few times per week." He has no chest pain or shortness of breath, currently. He will get SOB sometimes with exerting himself. He will get headaches when he feels his blood pressure is high and if he bends over. He states he is starting to "get serious" as he feels like his symptoms are happening more often.  He has a significant family history of HTN (mother, father, brother), and thinks his father has heart disease and has strokes. Of note, he has significant depression and anxiety, for which he is seeing counseling. He feels like that is going well. He feels like his blood pressure issues are worse when he gets anxious.  Review of Systems: As above.     Objective:   Physical Exam BP 159/100 mmHg  Pulse 90  Temp(Src) 98.2 F (36.8 C) (Oral)  Ht 6\' 1"  (1.854 m)  Wt 288 lb 12.8 oz (130.999 kg)  BMI 38.11 kg/m2 Manual recheck: 142 / 94 HEENT: [place]/AT, EOMI, PERRLA, MMM Neuro: alert, oriented, strength 5/5 in all four extremities, sensation grossly intact Cardio: RRR, no murmur appreciated Pulm: CTAB, no wheezes Ext: warm, well-perfused, no LE edema     Assessment & Plan:  29yo male with HTN, not entirely controlled with HCTZ 12.5 mg daily, alone - pt with significant family history and prefers referral to cardiology than management through PCP - offered but pt declined increasing HCTZ to 25 mg; Rx refilled for 12.5 mg dosing - offered but pt declined trial of Norvasc - referred to cardiology today - reviewed red flags that would prompt immediate return to care (worse pain, change in sensation,  SOB, etc; sx that would suggest MI or stroke) - pt to f/u as needed, after that  Of note, pt requested refill of Advair at the end of this visit. Instructed him to f/u PRN for this, specifically, otherwise.  Bobbye Mortonhristopher M Helana Macbride, MD PGY-3, [facility] 01/13/2015, 7:21 PM

## 2015-01-13 NOTE — Patient Instructions (Addendum)
Thank you for coming in, today!  I think it's reasonable for you to be seen by the heart doctors. I want you to keep taking your HCTZ, and I refilled that today. I will put in a referral today, and someone in the next several days should call you with an appointment time. If you haven't heart from anyone by the end of next week, call us back to check on the referral. You can also call them at [phone] Give the referral several days to get set up.  If you have frank chest pain, difficulty breathing, or any new worrisome symptoms, call us or go to the ED. Otherwise, come back to see us, as needed.  Please feel free to call with any questions or concerns at any time, at [phone]. --Dr. Casper HarrisonStreet

## 2015-01-13 NOTE — Progress Notes (Signed)
    Daily Group Progress Note  Program: IOP  Group Time: 9:00-10:00  Participation Level: Active  Behavioral Response: Appropriate  Type of Therapy:  Psycho-education Group  Summary of Progress: Pt. Participated in medication management group [facility].     Group Time: 10:00-12:00  Participation Level:  Active  Behavioral Response: Appropriate  Type of Therapy: Group Therapy  Summary of Progress: Pt. Presented as lethargic, depressed. Pt. Connected with themes related to work dissatisfaction. Pt. Reported that he was busy over the weekend caregiving for his children. Pt. Planning to see his psychiatrist tomorrow and will not attend group.   Shaune PollackBrown, Jennifer B, LPC

## 2015-01-14 ENCOUNTER — Other Ambulatory Visit (HOSPITAL_COMMUNITY): Payer: BLUE CROSS/BLUE SHIELD | Admitting: Psychiatry

## 2015-01-14 ENCOUNTER — Telehealth (HOSPITAL_COMMUNITY): Payer: Self-pay | Admitting: Psychiatry

## 2015-01-15 ENCOUNTER — Other Ambulatory Visit (HOSPITAL_COMMUNITY): Payer: BLUE CROSS/BLUE SHIELD | Admitting: Psychiatry

## 2015-01-15 DIAGNOSIS — F332 Major depressive disorder, recurrent severe without psychotic features: Secondary | ICD-10-CM | POA: Diagnosis not present

## 2015-01-15 DIAGNOSIS — F331 Major depressive disorder, recurrent, moderate: Secondary | ICD-10-CM

## 2015-01-15 NOTE — Progress Notes (Signed)
    Daily Group Progress Note  Program: IOP  Group Time: 9:00-10:30  Participation Level: Active  Behavioral Response: Appropriate  Type of Therapy:  Group Therapy  Summary of Progress: Pt. Reported that he was doing "ok". Pt. Reports that his seroquel was lowered because it raised his blood pressure and he was gaining weight. Pt. Reported difficulty focusing at work which he attributes to his anxiety and feeling depression at home and feeling overwhelmed by family stress.     Group Time: 10:30-12:00  Participation Level:  Active  Behavioral Response: Appropriate  Type of Therapy: Psycho-education Group  Summary of Progress: Pt. Watched Clovia CuffKristin Neff video and participated in discussion about developing self-compassion.   Shaune PollackBrown, Sabrin Dunlevy B, LPC

## 2015-01-16 ENCOUNTER — Other Ambulatory Visit (HOSPITAL_COMMUNITY): Payer: BLUE CROSS/BLUE SHIELD | Admitting: Psychiatry

## 2015-01-16 DIAGNOSIS — F332 Major depressive disorder, recurrent severe without psychotic features: Secondary | ICD-10-CM | POA: Diagnosis not present

## 2015-01-16 DIAGNOSIS — F331 Major depressive disorder, recurrent, moderate: Secondary | ICD-10-CM

## 2015-01-16 NOTE — Progress Notes (Signed)
    Daily Group Progress Note  Program: IOP  Group Time: 9:00-10:30  Participation Level: Active  Behavioral Response: Appropriate  Type of Therapy:  Group Therapy  Summary of Progress: Pt. Discussed raising children, end of relationship with fiance, job stress, and lack of social support as most significant stressors.      Group Time: 10:30-12:00  Participation Level:  Active  Behavioral Response: Appropriate  Type of Therapy: Psycho-education Group  Summary of Progress: Pt. Participated in journaling exercise "I I believed I was enough..."  Shaune PollackBrown, Jennifer B, LPC

## 2015-01-17 ENCOUNTER — Other Ambulatory Visit (HOSPITAL_COMMUNITY): Payer: BLUE CROSS/BLUE SHIELD | Admitting: Psychiatry

## 2015-01-17 DIAGNOSIS — F331 Major depressive disorder, recurrent, moderate: Secondary | ICD-10-CM

## 2015-01-17 DIAGNOSIS — F332 Major depressive disorder, recurrent severe without psychotic features: Secondary | ICD-10-CM | POA: Diagnosis not present

## 2015-01-17 NOTE — Progress Notes (Signed)
    Daily Group Progress Note  Program: IOP  Group Time: 9:00-10:30  Participation Level: Active  Behavioral Response: Appropriate  Type of Therapy:  Group Therapy  Summary of Progress: Pt. Was alert and attentive during group. Pt. Reported that he was feeling "ok", continues to process job stress and acknowledges need to return to school so that he can make job transition. Pt. Discussed interest in completing degree in criminal justice and pursuing job as Engineer, drillingprobation officer.      Group Time: 10:30-12:00  Participation Level:  Active  Behavioral Response: Appropriate  Type of Therapy: Psycho-education Group  Summary of Progress: Pt. Was alert and attentive in grief and loss group.   Shaune PollackBrown, Jennifer B, LPC

## 2015-01-17 NOTE — Telephone Encounter (Signed)
D:  [place] [employer] [place] requesting office/group notes since 01-09-15 to present.  A:  Faxed all group notes to her.

## 2015-01-20 ENCOUNTER — Other Ambulatory Visit (HOSPITAL_COMMUNITY): Payer: BLUE CROSS/BLUE SHIELD | Admitting: Psychiatry

## 2015-01-20 DIAGNOSIS — F331 Major depressive disorder, recurrent, moderate: Secondary | ICD-10-CM

## 2015-01-20 DIAGNOSIS — F332 Major depressive disorder, recurrent severe without psychotic features: Secondary | ICD-10-CM | POA: Diagnosis not present

## 2015-01-21 ENCOUNTER — Encounter: Payer: Self-pay | Admitting: Cardiology

## 2015-01-21 ENCOUNTER — Ambulatory Visit (INDEPENDENT_AMBULATORY_CARE_PROVIDER_SITE_OTHER): Payer: BLUE CROSS/BLUE SHIELD | Admitting: Cardiology

## 2015-01-21 ENCOUNTER — Other Ambulatory Visit (HOSPITAL_COMMUNITY): Payer: BLUE CROSS/BLUE SHIELD

## 2015-01-21 ENCOUNTER — Ambulatory Visit: Payer: Self-pay | Admitting: Family Medicine

## 2015-01-21 VITALS — BP 138/62 | HR 88 | Ht 74.0 in | Wt 288.2 lb

## 2015-01-21 DIAGNOSIS — G4730 Sleep apnea, unspecified: Secondary | ICD-10-CM

## 2015-01-21 DIAGNOSIS — I10 Essential (primary) hypertension: Secondary | ICD-10-CM

## 2015-01-21 NOTE — Patient Instructions (Signed)
Your physician recommends that you schedule a follow-up appointment in: one year with Dr. Antoine PocheHochrein  We have ordered a stress test  We are ordering a sleep study for you to get done

## 2015-01-21 NOTE — Progress Notes (Signed)
Cardiology Office Note   Date:  01/21/2015   ID:  Scott, Patterson 1986-03-08, MRN [id number]  PCP:  Anselm Lis, MD  Cardiologist:   Rollene Rotunda, MD   Chief Complaint  Patient presents with  . Chest Pain  . Dizziness      History of Present Illness: Scott Patterson is a 29 y.o. male who presents for evaluation of chest discomfort, headaches, shortness of breath. He has no past cardiac history but he does have family history of cardiovascular risk factors. He says he works in a stressful job. He has anxiety makes this very difficult. He says he gets chest discomfort frequently. It'll happen at rest. It might be 9 out of 10 in intensity. Climb stairs and might bring it on but not always. It might last for about half hour at a time. He might get dizzy or nauseated with this. This has been going on for 2 or 3 months and slowly progressive. He doesn't take anything to try to get rid of it. He's not had symptoms like this before. He does have shortness of breath walking up the stairs. He's not however describing any PND or orthopnea. He's not had any palpitations, presyncope or syncope. Of note he is being treated for depression and anxiety. It sounds like his anxiety somewhat severe. Unfortunately he's had about a 25 pound weight gain with his medications.  Past Medical History  Diagnosis Date  . Asthma   . Hypertension   . Depression   . Anxiety     Past Surgical History  Procedure Laterality Date  . None       Current Outpatient Prescriptions  Medication Sig Dispense Refill  . cetirizine (ZYRTEC) 10 MG tablet Take 10 mg by mouth daily.     . diazepam (VALIUM) 10 MG tablet Take 10 mg by mouth once.    . Fluticasone-Salmeterol (ADVAIR) 100-50 MCG/DOSE AEPB Inhale 1 puff into the lungs every 12 (twelve) hours as needed (For asthma). 60 each 1  . hydrochlorothiazide (MICROZIDE) 12.5 MG capsule Take 1 capsule (12.5 mg total) by mouth daily. 30 capsule 1  . PARoxetine (PAXIL  CR) 25 MG 24 hr tablet Take 1 tablet (25 mg total) by mouth daily. 30 tablet 0   No current facility-administered medications for this visit.    Allergies:   Review of patient's allergies indicates no known allergies.    Social History:  The patient  reports that he has never smoked. He has never used smokeless tobacco. He reports that he does not drink alcohol or use illicit drugs.   Family History:  The patient's family history includes Alcohol abuse in his father and paternal uncle; Depression in his mother; Drug abuse in his father; Hypertension in his brother, father, and mother; Stroke in his father.    ROS:  Please see the history of present illness.   Otherwise, review of systems are positive for none.   All other systems are reviewed and negative.    PHYSICAL EXAM: VS:  BP 138/62 mmHg  Pulse 88  Ht  (1.88 m)  Wt 288 lb 3.2 oz (130.727 kg)  BMI 36.99 kg/m2 , BMI Body mass index is 36.99 kg/(m^2). GEN: Well nourished, well developed, in no acute distress HEENT: normal Neck: no JVD, carotid bruits, or masses Cardiac: RRR; no murmurs, rubs, or gallops,no edema  Respiratory:  clear to auscultation bilaterally, normal work of breathing GI: soft, nontender, nondistended, + BS MS: no deformity or atrophy  Skin: warm and dry, no rash Neuro:  Strength and sensation are intact Psych: euthymic mood, full affect   EKG:  EKG is ordered today. The ekg ordered today demonstrates sinus rhythm, rate 88, axis within normal limits, intervals within normal limits, no acute ST-T wave changes. 01/21/2015     Wt Readings from Last 3 Encounters:  01/21/15 288 lb 3.2 oz (130.727 kg)  01/13/15 288 lb 12.8 oz (130.999 kg)  07/06/13 240 lb (108.863 kg)      Other studies Reviewed: Additional studies/ records that were reviewed today include: None   ASSESSMENT AND PLAN:  CHEST PAIN:    This is somewhat atypical. However, he has risk factors.  I will bring the patient back for a POET  (Plain Old Exercise Test). This will allow me to screen for obstructive coronary disease, risk stratify and very importantly provide a prescription for exercise.  ANXIETY:  We talked for quite a while about this. We discussed relaxation techniques. We will continue with his therapy for this. I think this contributes to some of his symptoms.  FATIGUE/SNORING:  The patient has fatigue snoring and daytime somnolence. He most likely has sleep apnea. I will set him up for sleep study.  HTN:  His blood pressure is currently well controlled. He will continue the meds as listed.   Current medicines are reviewed at length with the patient today.  The patient does not have concerns regarding medicines.  The following changes have been made:  no change  Labs/ tests ordered today include: POET (Plain Old Exercise Treadmill),  Sleep study  No orders of the defined types were placed in this encounter.     Disposition:   FU with me as needed   Signed, Rollene RotundaJames Tamirra Sienkiewicz, MD  01/21/2015 2:26 PM    [facility]

## 2015-01-22 ENCOUNTER — Other Ambulatory Visit (HOSPITAL_COMMUNITY): Payer: BLUE CROSS/BLUE SHIELD | Admitting: Psychiatry

## 2015-01-22 ENCOUNTER — Other Ambulatory Visit: Payer: Self-pay | Admitting: *Deleted

## 2015-01-22 ENCOUNTER — Telehealth (HOSPITAL_COMMUNITY): Payer: Self-pay | Admitting: *Deleted

## 2015-01-22 DIAGNOSIS — F332 Major depressive disorder, recurrent severe without psychotic features: Secondary | ICD-10-CM | POA: Diagnosis not present

## 2015-01-22 DIAGNOSIS — F331 Major depressive disorder, recurrent, moderate: Secondary | ICD-10-CM

## 2015-01-22 DIAGNOSIS — G4730 Sleep apnea, unspecified: Secondary | ICD-10-CM

## 2015-01-22 NOTE — Progress Notes (Signed)
Scott Patterson is a 29 y.o., single, African American, male who was [place] per [facility] [employer], treatment for worsening depressive and anxiety symptoms. Denied SI, HI, or A/V hallucinations. Denied any previous suicide attempts or gestures. No prior inpatient psychiatric hospitalizations. Has previously seen Dr. Evelene CroonKaur and therapist Garry Heater([facility] Adds he has seen a therapist previously years ago. Family Hx: Father has a long hx of drug and ETOH use, Paternal Uncle struggles with ETOH, and mother suffers with depression. Triggers/Stressors: 1) Job ([employer]) of four years. Pt use to work within [employer] before being his particular department closed. He states he was demoted to another department last year.  "My performance has gone down and I've been written up. Patient hasn't worked since November 2015. 2) Financial Strain 3) Loss of relationship with daughter's mother. According to pt, strained relationship ended April 2015. Pt states he is now a single parent to their 677 yr old daughter. The 2 yr old is living with the mother. 4) Mother: Pt states he worries about her because she has multiple medical issues. 5) Medical Issues of his own: HTN and Asthma 6) Online school [employer]) Pt states he has a year and a half to go. Due to the stress he put it on hold. Pt will complete MH-IOP tomorrow.  Reports improved depression and anxiety.  Denies SI/HI and A/V hallucinations.  States that the groups were helpful and he was able to put things in perspective.  Pt reports his PCP wants him to have a sleep test and a stress test.  A:  D/C tomorrow.  F/U with Jovea Herbin, LCSW on 01-28-15 @ 10 a.m and Dr. Kenna GilbertArgarwal on 03-13-15 @ 11 a.m.  Also, pt to f/u with [facility].  RTW on 02-05-15; without any restrictions.  R:  Pt receptive.

## 2015-01-22 NOTE — Patient Instructions (Signed)
Patient will complete MH-IOP tomorrow.  Will follow up with Jovea Herbin, LCSW on 01-28-15 @ 10 a.m and Dr. Kenna GilbertArgarwal on 03-13-15 @ 11 a.m.  Encouraged support groups.  Patient will follow up with [facility].  RTW on 02-05-15; without any restrictions.

## 2015-01-22 NOTE — Progress Notes (Signed)
    Daily Group Progress Note  Program: IOP  Group Time: 9:00-10:30  Participation Level: Active  Behavioral Response: Appropriate  Type of Therapy:  Group Therapy  Summary of Progress: Pt. was active in group.Pt. learned about various mental health medications with the pharmacist [facility].      Group Time: 10:30-12:00  Participation Level:  Active  Behavioral Response: Appropriate  Type of Therapy: Psycho-education Group  Summary of Progress: Pt. was active in group. Pt. learned about symptoms of panic attacks and what causes them to occur, treatment options, and discussed how family members or others unintentionally say negative comments about depression and anxiety. Pt. watched a video on what a panic attack can look like and shared an experience of having panic attacks at work.   Shaune PollackBrown, Scott Patterson B, LPC

## 2015-01-22 NOTE — Progress Notes (Signed)
    Daily Group Progress Note  Program: IOP  Group Time: 9:00-10:30  Participation Level: Active  Behavioral Response: Appropriate  Type of Therapy:  Group Therapy  Summary of Progress: Pt. Reported that he was "feeling ok, a little tired". Pt. Continues to present as sleepy and lethargic, did not report any significant stressors. Pt. Indicated that he felt ready to discharge tomorrow.      Group Time: 10:30-12:00  Participation Level:  Active  Behavioral Response: Appropriate  Type of Therapy: Psycho-education Group  Summary of Progress: Pt. Participated in progressive muscle relaxation exercise.   Shaune PollackBrown, Braniya Farrugia B, LPC

## 2015-01-23 ENCOUNTER — Other Ambulatory Visit (HOSPITAL_COMMUNITY): Payer: BLUE CROSS/BLUE SHIELD

## 2015-01-24 ENCOUNTER — Other Ambulatory Visit (HOSPITAL_COMMUNITY): Payer: BLUE CROSS/BLUE SHIELD

## 2015-01-27 ENCOUNTER — Other Ambulatory Visit (HOSPITAL_COMMUNITY): Payer: BLUE CROSS/BLUE SHIELD

## 2015-01-28 ENCOUNTER — Other Ambulatory Visit (HOSPITAL_COMMUNITY): Payer: BLUE CROSS/BLUE SHIELD

## 2015-01-28 ENCOUNTER — Telehealth: Payer: Self-pay | Admitting: Cardiology

## 2015-01-28 NOTE — Progress Notes (Signed)
  [facility] Discharge Summary  Nyra CapesRemus L Spratlin [id number]  Admission date: 01/07/2015 Discharge date: 01/28/2015  Reason for admission: worsening depression and anxiety  Chemical Use History: none  Family of Origin Issues: none of current significance  Progress in Program Toward Treatment Goals: [name] says the program was helpful.  He seemed less depressed at the time of discharge but was still struggling with wanting to change jobs but not finding anything that suited him.  Seemed as stuck in that sense as when he came in.  Progress (rationale): structure daily and a place to talk about his situation.  His job seems to be the main issue and until that changes he seems stuck.    Benjaman PottAYLOR,Delitha Elms D, MD 01/28/2015

## 2015-01-29 ENCOUNTER — Other Ambulatory Visit (HOSPITAL_COMMUNITY): Payer: BLUE CROSS/BLUE SHIELD

## 2015-01-29 NOTE — Telephone Encounter (Signed)
Spoke with patient - he will call back and schedule stress test.

## 2015-01-30 ENCOUNTER — Other Ambulatory Visit (HOSPITAL_COMMUNITY): Payer: BLUE CROSS/BLUE SHIELD

## 2015-01-31 ENCOUNTER — Other Ambulatory Visit (HOSPITAL_COMMUNITY): Payer: BLUE CROSS/BLUE SHIELD

## 2015-02-03 ENCOUNTER — Other Ambulatory Visit (HOSPITAL_COMMUNITY): Payer: BLUE CROSS/BLUE SHIELD

## 2015-02-04 ENCOUNTER — Telehealth (HOSPITAL_COMMUNITY): Payer: Self-pay | Admitting: *Deleted

## 2015-02-04 ENCOUNTER — Other Ambulatory Visit (HOSPITAL_COMMUNITY): Payer: BLUE CROSS/BLUE SHIELD

## 2015-02-04 ENCOUNTER — Ambulatory Visit (HOSPITAL_BASED_OUTPATIENT_CLINIC_OR_DEPARTMENT_OTHER): Payer: BLUE CROSS/BLUE SHIELD | Attending: Cardiology

## 2015-02-04 ENCOUNTER — Institutional Professional Consult (permissible substitution): Payer: Self-pay | Admitting: Cardiology

## 2015-02-05 ENCOUNTER — Other Ambulatory Visit (HOSPITAL_COMMUNITY): Payer: BLUE CROSS/BLUE SHIELD

## 2015-02-06 ENCOUNTER — Other Ambulatory Visit (HOSPITAL_COMMUNITY): Payer: BLUE CROSS/BLUE SHIELD

## 2015-02-07 ENCOUNTER — Other Ambulatory Visit (HOSPITAL_COMMUNITY): Payer: BLUE CROSS/BLUE SHIELD

## 2015-02-10 ENCOUNTER — Other Ambulatory Visit (HOSPITAL_COMMUNITY): Payer: BLUE CROSS/BLUE SHIELD

## 2015-02-11 ENCOUNTER — Other Ambulatory Visit (HOSPITAL_COMMUNITY): Payer: Managed Care, Other (non HMO)

## 2015-02-12 ENCOUNTER — Other Ambulatory Visit (HOSPITAL_COMMUNITY): Payer: Managed Care, Other (non HMO)

## 2015-02-13 ENCOUNTER — Other Ambulatory Visit (HOSPITAL_COMMUNITY): Payer: Managed Care, Other (non HMO)

## 2015-02-14 ENCOUNTER — Other Ambulatory Visit (HOSPITAL_COMMUNITY): Payer: Managed Care, Other (non HMO)

## 2015-02-17 ENCOUNTER — Other Ambulatory Visit (HOSPITAL_COMMUNITY): Payer: Managed Care, Other (non HMO)

## 2015-03-03 ENCOUNTER — Emergency Department (HOSPITAL_BASED_OUTPATIENT_CLINIC_OR_DEPARTMENT_OTHER)
Admission: EM | Admit: 2015-03-03 | Discharge: 2015-03-03 | Disposition: A | Discharge status: Home / Self Care | Payer: BLUE CROSS/BLUE SHIELD | Attending: Emergency Medicine | Admitting: Emergency Medicine

## 2015-03-03 ENCOUNTER — Encounter (HOSPITAL_BASED_OUTPATIENT_CLINIC_OR_DEPARTMENT_OTHER): Payer: Self-pay

## 2015-03-03 DIAGNOSIS — R197 Diarrhea, unspecified: Secondary | ICD-10-CM | POA: Insufficient documentation

## 2015-03-03 DIAGNOSIS — R52 Pain, unspecified: Secondary | ICD-10-CM | POA: Insufficient documentation

## 2015-03-03 DIAGNOSIS — R110 Nausea: Secondary | ICD-10-CM

## 2015-03-03 DIAGNOSIS — Z79899 Other long term (current) drug therapy: Secondary | ICD-10-CM | POA: Insufficient documentation

## 2015-03-03 DIAGNOSIS — R0981 Nasal congestion: Secondary | ICD-10-CM | POA: Diagnosis not present

## 2015-03-03 DIAGNOSIS — Z630 Problems in relationship with spouse or partner: Secondary | ICD-10-CM | POA: Insufficient documentation

## 2015-03-03 DIAGNOSIS — R05 Cough: Secondary | ICD-10-CM | POA: Insufficient documentation

## 2015-03-03 DIAGNOSIS — R109 Unspecified abdominal pain: Secondary | ICD-10-CM | POA: Diagnosis present

## 2015-03-03 DIAGNOSIS — R112 Nausea with vomiting, unspecified: Secondary | ICD-10-CM | POA: Diagnosis not present

## 2015-03-03 MED ORDER — ONDANSETRON 4 MG PO TBDP
4.0000 mg | ORAL_TABLET | Freq: Once | ORAL | Status: AC
  Administered 2015-03-03: 4 mg via ORAL
  Filled 2015-03-03: qty 1

## 2015-03-03 MED ORDER — IBUPROFEN 800 MG PO TABS
800.0000 mg | ORAL_TABLET | Freq: Once | ORAL | Status: AC
  Administered 2015-03-03: 800 mg via ORAL
  Filled 2015-03-03: qty 1

## 2015-03-03 MED ORDER — ONDANSETRON 4 MG PO TBDP: ORAL_TABLET | ORAL | Status: DC

## 2015-03-03 NOTE — ED Provider Notes (Signed)
CSN: [id number]     Arrival date & time 03/03/15  1309 History   First MD Initiated Contact with Patient 03/03/15 1316     Chief Complaint  Patient presents with  . Abdominal Pain     (Consider location/radiation/quality/duration/timing/severity/associated sxs/prior Treatment) HPI Comments: 29 year old male with history of obesity, asthma, high blood pressure presents with nausea vomiting diarrhea and upper abdominal cramping since yesterday.. Patient feels like he has a bad cold. Mild cough congestion. Daughter has vomiting and nausea recently. No recent travel, no fevers.  Patient is a 29 y.o. male presenting with abdominal pain. The history is provided by the patient.  Abdominal Pain Associated symptoms: cough, diarrhea, nausea and vomiting   Associated symptoms: no chest pain, no chills, no dysuria, no fever and no shortness of breath     Past Medical History  Diagnosis Date  . Asthma   . Hypertension   . Depression   . Anxiety    Past Surgical History  Procedure Laterality Date  . None     Family History  Problem Relation Age of Onset  . Depression Mother   . Alcohol abuse Father   . Drug abuse Father   . Alcohol abuse Paternal Uncle   . Stroke Father   . Hypertension Father   . Hypertension Mother   . Hypertension Brother    History  Substance Use Topics  . Smoking status: Never Smoker   . Smokeless tobacco: Never Used  . Alcohol Use: No    Review of Systems  Constitutional: Positive for appetite change. Negative for fever and chills.  HENT: Positive for congestion.   Eyes: Negative for visual disturbance.  Respiratory: Positive for cough. Negative for shortness of breath.   Cardiovascular: Negative for chest pain.  Gastrointestinal: Positive for nausea, vomiting, abdominal pain and diarrhea.  Genitourinary: Negative for dysuria and flank pain.  Musculoskeletal: Negative for back pain, neck pain and neck stiffness.  Skin: Negative for rash.   Neurological: Negative for light-headedness and headaches.      Allergies  Review of patient's allergies indicates no known allergies.  Home Medications   Prior to Admission medications   Medication Sig Start Date End Date Taking? Authorizing Provider  cetirizine (ZYRTEC) 10 MG tablet Take 10 mg by mouth daily.     Historical Provider, MD  diazepam (VALIUM) 10 MG tablet Take 10 mg by mouth once.    Historical Provider, MD  ondansetron (ZOFRAN ODT) 4 MG disintegrating tablet 4mg  ODT q4 hours prn nausea/vomit 03/03/15   Blane OharaJoshua Anyeli Hockenbury, MD   BP 141/89 mmHg  Pulse 102  Temp(Src) 99.3 F (37.4 C) (Oral)  Resp 18  Ht 6\' 2"  (1.88 m)  Wt 270 lb (122.471 kg)  BMI 34.65 kg/m2  SpO2 100% Physical Exam  Constitutional: He is oriented to person, place, and time. He appears well-developed and well-nourished.  HENT:  Head: Normocephalic and atraumatic.  Mild dry mucous membranes  Eyes: Conjunctivae are normal. Right eye exhibits no discharge. Left eye exhibits no discharge.  Neck: Normal range of motion. Neck supple. No tracheal deviation (no meningismus) present.  Cardiovascular: Regular rhythm.   Pulmonary/Chest: Effort normal and breath sounds normal.  Abdominal: Soft. He exhibits no distension. There is no tenderness. There is no guarding.  Musculoskeletal: He exhibits no edema.  Neurological: He is alert and oriented to person, place, and time. No cranial nerve deficit.  Skin: Skin is warm. No rash noted.  Psychiatric: He has a normal mood and affect.  Nursing  note and vitals reviewed.   ED Course  Procedures (including critical care time) Labs Review Labs Reviewed - No data to display  Imaging Review No results found.   EKG Interpretation None      MDM   Final diagnoses:  Nausea  Body aches   Well-appearing patient presents with multiple symptoms likely viral in origin. No meningismus.  Discussed supportive care and reasons to return. Oral fluids in ER  Results  and differential diagnosis were discussed with the patient/parent/guardian. Close follow up outpatient was discussed, comfortable with the plan.   Medications  ondansetron (ZOFRAN-ODT) disintegrating tablet 4 mg (4 mg Oral Given 03/03/15 1339)  ibuprofen (ADVIL,MOTRIN) tablet 800 mg (800 mg Oral Given 03/03/15 1338)    Filed Vitals:   03/03/15 1317  BP: 141/89  Pulse: 102  Temp: 99.3 F (37.4 C)  TempSrc: Oral  Resp: 18  Height:  (1.88 m)  Weight: 270 lb (122.471 kg)  SpO2: 100%    Final diagnoses:  Nausea  Body aches        Blane Ohara, MD 03/03/15 1426

## 2015-03-03 NOTE — ED Notes (Signed)
C/o abd pain-denies n/v/d-states "feel like i got a bad cold"

## 2015-03-03 NOTE — Discharge Instructions (Signed)
If you were given medicines take as directed.  If you are on coumadin or contraceptives realize their levels and effectiveness is altered by many different medicines.  If you have any reaction (rash, tongues swelling, other) to the medicines stop taking and see a physician.   Please follow up as directed and return to the ER or see a physician for new or worsening symptoms.  Thank you. Filed Vitals:   03/03/15 1317  BP: 141/89  Pulse: 102  Temp: 99.3 F (37.4 C)  TempSrc: Oral  Resp: 18  Height: 6\' 2"  (1.88 m)  Weight: 270 lb (122.471 kg)  SpO2: 100%

## 2015-03-13 ENCOUNTER — Ambulatory Visit (HOSPITAL_COMMUNITY): Payer: Self-pay | Admitting: Psychiatry

## 2015-06-30 ENCOUNTER — Emergency Department (HOSPITAL_COMMUNITY): Payer: 59

## 2015-06-30 ENCOUNTER — Encounter (HOSPITAL_COMMUNITY): Payer: Self-pay | Admitting: *Deleted

## 2015-06-30 ENCOUNTER — Emergency Department (HOSPITAL_COMMUNITY)
Admission: EM | Admit: 2015-06-30 | Discharge: 2015-06-30 | Disposition: A | Discharge status: Home / Self Care | Payer: 59 | Attending: Emergency Medicine | Admitting: Emergency Medicine

## 2015-06-30 DIAGNOSIS — Y9389 Activity, other specified: Secondary | ICD-10-CM | POA: Diagnosis not present

## 2015-06-30 DIAGNOSIS — Z79899 Other long term (current) drug therapy: Secondary | ICD-10-CM | POA: Diagnosis not present

## 2015-06-30 DIAGNOSIS — S161XXA Strain of muscle, fascia and tendon at neck level, initial encounter: Secondary | ICD-10-CM | POA: Diagnosis not present

## 2015-06-30 DIAGNOSIS — S199XXA Unspecified injury of neck, initial encounter: Secondary | ICD-10-CM | POA: Diagnosis present

## 2015-06-30 DIAGNOSIS — M25562 Pain in left knee: Secondary | ICD-10-CM

## 2015-06-30 DIAGNOSIS — S8992XA Unspecified injury of left lower leg, initial encounter: Secondary | ICD-10-CM | POA: Insufficient documentation

## 2015-06-30 DIAGNOSIS — J45909 Unspecified asthma, uncomplicated: Secondary | ICD-10-CM | POA: Insufficient documentation

## 2015-06-30 DIAGNOSIS — Y92410 Unspecified street and highway as the place of occurrence of the external cause: Secondary | ICD-10-CM | POA: Insufficient documentation

## 2015-06-30 DIAGNOSIS — I10 Essential (primary) hypertension: Secondary | ICD-10-CM | POA: Diagnosis not present

## 2015-06-30 DIAGNOSIS — Y998 Other external cause status: Secondary | ICD-10-CM | POA: Insufficient documentation

## 2015-06-30 DIAGNOSIS — F419 Anxiety disorder, unspecified: Secondary | ICD-10-CM | POA: Insufficient documentation

## 2015-06-30 MED ORDER — CYCLOBENZAPRINE HCL 10 MG PO TABS: 10.0000 mg | ORAL_TABLET | Freq: Two times a day (BID) | ORAL | Status: DC | PRN

## 2015-06-30 MED ORDER — IBUPROFEN 800 MG PO TABS: 800.0000 mg | ORAL_TABLET | Freq: Three times a day (TID) | ORAL | Status: DC

## 2015-06-30 NOTE — Discharge Instructions (Signed)
Cervical Sprain A cervical sprain is when the tissues (ligaments) that hold the neck bones in place stretch or tear. HOME CARE   Put ice on the injured area.  Put ice in a plastic bag.  Place a towel between your skin and the bag.  Leave the ice on for 15-20 minutes, 3-4 times a day.  You may have been given a collar to wear. This collar keeps your neck from moving while you heal.  Do not take the collar off unless told by your doctor.  If you have long hair, keep it outside of the collar.  Ask your doctor before changing the position of your collar. You may need to change its position over time to make it more comfortable.  If you are allowed to take off the collar for cleaning or bathing, follow your doctor's instructions on how to do it safely.  Keep your collar clean by wiping it with mild soap and water. Dry it completely. If the collar has removable pads, remove them every 1-2 days to hand wash them with soap and water. Allow them to air dry. They should be dry before you wear them in the collar.  Do not drive while wearing the collar.  Only take medicine as told by your doctor.  Keep all doctor visits as told.  Keep all physical therapy visits as told.  Adjust your work station so that you have good posture while you work.  Avoid positions and activities that make your problems worse.  Warm up and stretch before being active. GET HELP IF:  Your pain is not controlled with medicine.  You cannot take less pain medicine over time as planned.  Your activity level does not improve as expected. GET HELP RIGHT AWAY IF:   You are bleeding.  Your stomach is upset.  You have an allergic reaction to your medicine.  You develop new problems that you cannot explain.  You lose feeling (become numb) or you cannot move any part of your body (paralysis).  You have tingling or weakness in any part of your body.  Your symptoms get worse. Symptoms include:  Pain,  soreness, stiffness, puffiness (swelling), or a burning feeling in your neck.  Pain when your neck is touched.  Shoulder or upper back pain.  Limited ability to move your neck.  Headache.  Dizziness.  Your hands or arms feel week, lose feeling, or tingle.  Muscle spasms.  Difficulty swallowing or chewing. MAKE SURE YOU:   Understand these instructions.  Will watch your condition.  Will get help right away if you are not doing well or get worse. Document Released: 05/17/2008 Document Revised: 08/01/2013 Document Reviewed: 06/06/2013 Exit[employer] Patient Information 2015 [employer]. This information is not intended to replace advice given to you by your health care provider. Make sure you discuss any questions you have with your health care provider.  
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
## 2015-06-30 NOTE — ED Notes (Signed)
Declined W/C at D/C and was escorted to lobby by RN. 
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
## 2015-11-03 ENCOUNTER — Encounter: Payer: Self-pay | Admitting: Student

## 2015-12-02 ENCOUNTER — Emergency Department (HOSPITAL_BASED_OUTPATIENT_CLINIC_OR_DEPARTMENT_OTHER)
Admission: EM | Admit: 2015-12-02 | Discharge: 2015-12-02 | Disposition: A | Discharge status: Home / Self Care | Payer: 59 | Attending: Emergency Medicine | Admitting: Emergency Medicine

## 2015-12-02 ENCOUNTER — Encounter (HOSPITAL_BASED_OUTPATIENT_CLINIC_OR_DEPARTMENT_OTHER): Payer: Self-pay | Admitting: *Deleted

## 2015-12-02 DIAGNOSIS — F419 Anxiety disorder, unspecified: Secondary | ICD-10-CM | POA: Diagnosis not present

## 2015-12-02 DIAGNOSIS — F329 Major depressive disorder, single episode, unspecified: Secondary | ICD-10-CM | POA: Insufficient documentation

## 2015-12-02 DIAGNOSIS — J45909 Unspecified asthma, uncomplicated: Secondary | ICD-10-CM | POA: Diagnosis not present

## 2015-12-02 DIAGNOSIS — Z79899 Other long term (current) drug therapy: Secondary | ICD-10-CM | POA: Diagnosis not present

## 2015-12-02 DIAGNOSIS — L02413 Cutaneous abscess of right upper limb: Secondary | ICD-10-CM | POA: Diagnosis present

## 2015-12-02 DIAGNOSIS — L0291 Cutaneous abscess, unspecified: Secondary | ICD-10-CM

## 2015-12-02 DIAGNOSIS — Z791 Long term (current) use of non-steroidal anti-inflammatories (NSAID): Secondary | ICD-10-CM | POA: Diagnosis not present

## 2015-12-02 DIAGNOSIS — I10 Essential (primary) hypertension: Secondary | ICD-10-CM | POA: Insufficient documentation

## 2015-12-02 MED ORDER — LIDOCAINE HCL 2 % IJ SOLN
10.0000 mL | Freq: Once | INTRAMUSCULAR | Status: AC
  Administered 2015-12-02: 200 mg
  Filled 2015-12-02: qty 20

## 2015-12-02 MED ORDER — LIDOCAINE HCL (PF) 2 % IJ SOLN
INTRAMUSCULAR | Status: AC
  Filled 2015-12-02: qty 4

## 2015-12-02 NOTE — ED Notes (Signed)
R posterior wrist abscess noted. swelling and redness noted. TTP. (denies: fever, nv, cramping, dizziness, numbness or tingling or other sx noted), "has tried to open it up with finger nails", first noticed 2-3d ago.

## 2015-12-02 NOTE — ED Notes (Signed)
ED PA at BS 

## 2015-12-02 NOTE — ED Notes (Signed)
EDPA into room, at BS.  

## 2015-12-02 NOTE — ED Notes (Signed)
Abscess to his right wrist. Starting to drain yellow pus.

## 2015-12-02 NOTE — ED Notes (Signed)
[facility]

## 2015-12-02 NOTE — Discharge Instructions (Signed)
Incision and Drainage Incision and drainage is a procedure in which a sac-like structure (cystic structure) is opened and drained. The area to be drained usually contains material such as pus, fluid, or blood.  LET YOUR CAREGIVER KNOW ABOUT:   Allergies to medicine.  Medicines taken, including vitamins, herbs, eyedrops, over-the-counter medicines, and creams.  Use of steroids (by mouth or creams).  Previous problems with anesthetics or numbing medicines.  History of bleeding problems or blood clots.  Previous surgery.  Other health problems, including diabetes and kidney problems.  Possibility of pregnancy, if this applies. RISKS AND COMPLICATIONS  Pain.  Bleeding.  Scarring.  Infection. BEFORE THE PROCEDURE  You may need to have an ultrasound or other imaging tests to see how large or deep your cystic structure is. Blood tests may also be used to determine if you have an infection or how severe the infection is. You may need to have a tetanus shot. PROCEDURE  The affected area is cleaned with a cleaning fluid. The cyst area will then be numbed with a medicine (local anesthetic). A small incision will be made in the cystic structure. A syringe or catheter may be used to drain the contents of the cystic structure, or the contents may be squeezed out. The area will then be flushed with a cleansing solution. After cleansing the area, it is often gently packed with a gauze or another wound dressing. Once it is packed, it will be covered with gauze and tape or some other type of wound dressing. AFTER THE PROCEDURE   Often, you will be allowed to go home right after the procedure.  You may be given antibiotic medicine to prevent or heal an infection.  If the area was packed with gauze or some other wound dressing, you will likely need to come back in 1 to 2 days to get it removed.  The area should heal in about 14 days.   This information is not intended to replace advice given  to you by your health care provider. Make sure you discuss any questions you have with your health care provider.   Document Released: 05/25/2001 Document Revised: 05/30/2012 Document Reviewed: 01/24/2012 Elsevier Interactive Patient Education [employer]2016 [employer]   Please have wound evaluated in 2-3 days. If any signs of infection present please return medially for further evaluation and management

## 2015-12-02 NOTE — ED Provider Notes (Signed)
CSN: [id number]     Arrival date & time 12/02/15  1931 History   First MD Initiated Contact with Patient 12/02/15 2002     Chief Complaint  Patient presents with  . Abscess   HPI   Patient presents with an abscess to his left wrist 2 days. He reports very started as a firm nodule that progressed to its current state with small amount of purulent discharge today. He denies any surrounding redness, swelling, warmth touch, or history of the same. Patient denies any IV drug use, no history of significant skin infections. He denies any nausea or vomiting, fever, chills, or any systemic symptoms. In the over-the-counter medications prior to arrival. Patient reports full active range of motion of the wrist hands and fingers, sensation intact.    Past Medical History  Diagnosis Date  . Asthma   . Hypertension   . Depression   . Anxiety    Past Surgical History  Procedure Laterality Date  . None     Family History  Problem Relation Age of Onset  . Depression Mother   . Alcohol abuse Father   . Drug abuse Father   . Alcohol abuse Paternal Uncle   . Stroke Father   . Hypertension Father   . Hypertension Mother   . Hypertension Brother    Social History  Substance Use Topics  . Smoking status: Never Smoker   . Smokeless tobacco: Never Used  . Alcohol Use: No    Review of Systems  All other systems reviewed and are negative.     Allergies  Review of patient's allergies indicates no known allergies.  Home Medications   Prior to Admission medications   Medication Sig Start Date End Date Taking? Authorizing Provider  cetirizine (ZYRTEC) 10 MG tablet Take 10 mg by mouth daily.     Historical Provider, MD  cyclobenzaprine (FLEXERIL) 10 MG tablet Take 1 tablet (10 mg total) by mouth 2 (two) times daily as needed for muscle spasms. 06/30/15   Teressa Lower, NP  diazepam (VALIUM) 10 MG tablet Take 10 mg by mouth once.    Historical Provider, MD  ibuprofen (ADVIL,MOTRIN) 800 MG  tablet Take 1 tablet (800 mg total) by mouth 3 (three) times daily. 06/30/15   Teressa Lower, NP  ondansetron (ZOFRAN ODT) 4 MG disintegrating tablet  ODT q4 hours prn nausea/vomit 03/03/15   Blane Ohara, MD   BP 147/94 mmHg  Pulse 72  Temp(Src) 98.3 F (36.8 C) (Oral)  Resp 18  Ht  (1.88 m)  Wt 122.471 kg  BMI 34.65 kg/m2  SpO2 100% Physical Exam  Constitutional: He is oriented to person, place, and time. He appears well-developed and well-nourished.  HENT:  Head: Normocephalic and atraumatic.  Eyes: Conjunctivae are normal. Pupils are equal, round, and reactive to light. Right eye exhibits no discharge. Left eye exhibits no discharge. No scleral icterus.  Neck: Normal range of motion. No JVD present. No tracheal deviation present.  Pulmonary/Chest: Effort normal. No stridor.  Neurological: He is alert and oriented to person, place, and time. Coordination normal.  Skin:  1 cm abscess to the right wrist along the medial dorsal aspect. No surrounding cellulitis. No loss of distal sensation strength or motor function  Psychiatric: He has a normal mood and affect. His behavior is normal. Judgment and thought content normal.  Nursing note and vitals reviewed.   ED Course  Procedures (including critical care time)  INCISION AND DRAINAGE Performed by: Thermon Leyland Consent: Verbal  consent obtained. Risks and benefits: risks, benefits and alternatives were discussed Type: abscess  Body area: Right wrist  Anesthesia: local infiltration  Incision was made with a scalpel.  Local anesthetic: lidocaine percent   Anesthetic total: 4 ml  Complexity: complex Blunt dissection to break up loculations  Drainage: purulent  Drainage amount: 1 mL   No packing material   Patient tolerance: Patient tolerated the procedure well with no immediate complications.   Labs Review Labs Reviewed - No data to display  Imaging Review No results found. I have personally  reviewed and evaluated these images and lab results as part of my medical decision-making.   EKG Interpretation None      MDM   Final diagnoses:  Abscess    Labs:  Imaging:  Consults:  Therapeutics: Lidocaine  Discharge Meds:   Assessment/Plan: Patient resents with simple abscess to his right wrist, no deep space involvement, no distal involvement, no signs of surrounding cellulitis. Abscess drained without difficulty, patient instructed to follow-up in 3 days for reevaluation, wound care instructions given. Patient verbalized understanding and agreement today's plan and had no further questions or concerns at time of discharge         Eyvonne MechanicJeffrey Mahkayla Preece, PA-C 12/02/15 2142  Pricilla LovelessScott Goldston, MD 12/05/15 [phone]

## 2016-01-26 ENCOUNTER — Ambulatory Visit (INDEPENDENT_AMBULATORY_CARE_PROVIDER_SITE_OTHER): Payer: BLUE CROSS/BLUE SHIELD | Admitting: Family Medicine

## 2016-01-26 ENCOUNTER — Ambulatory Visit: Payer: Self-pay | Admitting: Student

## 2016-01-26 ENCOUNTER — Encounter: Payer: Self-pay | Admitting: Family Medicine

## 2016-01-26 VITALS — BP 148/88 | HR 78 | Temp 98.2°F | Wt 286.0 lb

## 2016-01-26 DIAGNOSIS — R358 Other polyuria: Secondary | ICD-10-CM

## 2016-01-26 DIAGNOSIS — E669 Obesity, unspecified: Secondary | ICD-10-CM | POA: Diagnosis not present

## 2016-01-26 DIAGNOSIS — R3589 Other polyuria: Secondary | ICD-10-CM

## 2016-01-26 DIAGNOSIS — I10 Essential (primary) hypertension: Secondary | ICD-10-CM | POA: Diagnosis not present

## 2016-01-26 DIAGNOSIS — J309 Allergic rhinitis, unspecified: Secondary | ICD-10-CM

## 2016-01-26 LAB — POCT GLYCOSYLATED HEMOGLOBIN (HGB A1C): Hemoglobin A1C: 5.6

## 2016-01-26 MED ORDER — HYDROCHLOROTHIAZIDE 12.5 MG PO TABS: 12.5000 mg | ORAL_TABLET | Freq: Every day | ORAL | Status: DC

## 2016-01-26 MED ORDER — CETIRIZINE HCL 10 MG PO TABS: 10.0000 mg | ORAL_TABLET | Freq: Every day | ORAL | Status: DC

## 2016-01-26 NOTE — Progress Notes (Signed)
    Subjective:  Scott Patterson is a 30 y.o. male who presents to the [facility] today with a chief complaint of blood pressure follow up.   HPI:  Hypertension BP Readings from Last 3 Encounters:  01/26/16 148/88  12/02/15 147/94  06/30/15 150/86   Home BP monitoring-No Compliant with medications-No ROS-Denies any CP, HA, SOB, blurry vision, LE edema, transient weakness, orthopnea, PND.  Has been out of medication for several months.   Frequent Urination For the past year patient has noticed increased frequency. He urinates every few hours and feels like liquid passes through him. Has also noticed increased thirst. Wakes up a few times at night to urinate. No history of diabetes. No fevers or chills. No dysuria.   Allergic Rhinitis Stable on zyrtec.   ROS: Per HPI  PMH:  The following were reviewed and entered/updated in epic: Past Medical History  Diagnosis Date  . Asthma   . Hypertension   . Depression   . Anxiety    Patient Active Problem List   Diagnosis Date Noted  . Polyuria 01/26/2016  . Depression 01/11/2014  . Dysuria 09/08/2012  . Genital herpes 04/02/2011  . ESSENTIAL HYPERTENSION, BENIGN 12/11/2010  . OBESITY, NOS 02/09/2007  . Allergic rhinitis 02/09/2007  . Asthma, moderate persistent 02/09/2007   Past Surgical History  Procedure Laterality Date  . None     Objective:  Physical Exam: BP 148/88 mmHg  Pulse 78  Temp(Src) 98.2 F (36.8 C) (Oral)  Wt 286 lb (129.729 kg)  Gen: NAD, resting comfortably CV: RRR with no murmurs appreciated Pulm: NWOB, CTAB with no crackles, wheezes, or rhonchi GI: Normal bowel sounds present. Soft, Nontender, Nondistended. MSK: no edema, cyanosis, or clubbing noted Skin: warm, dry Neuro: grossly normal, moves all extremities Psych: Normal affect and thought content  Assessment/Plan:  ESSENTIAL HYPERTENSION, BENIGN Elevated today. Will restart HCTZ 12.5mg . Check BMP today. Follow up in 2-3 weeks with PCP for BP check  and repeat labs.   Allergic rhinitis Stable. Zyrtec refilled today.   Polyuria Concern for DM given history of obesity and HTN. Will check BMP and A1c today. Patient unable to give urine sample today - consider checking at follow up if A1c normal.    Katina Degree. Jimmey Ralph, MD [facility] Resident PGY-2 01/26/2016 2:52 PM

## 2016-01-26 NOTE — Assessment & Plan Note (Signed)
Elevated today. Will restart HCTZ 12.5mg . Check BMP today. Follow up in 2-3 weeks with PCP for BP check and repeat labs.

## 2016-01-26 NOTE — Patient Instructions (Signed)
We will refill your blood pressure medication today. We will also check your blood work.  We will send you a letter in the mail or call you with the results.  Please come back in a few weeks to meet with your regular doctor, or sooner if you need anything else.  Take care,  Dr Jimmey Ralph

## 2016-01-26 NOTE — Assessment & Plan Note (Signed)
Stable. Zyrtec refilled today.

## 2016-01-26 NOTE — Assessment & Plan Note (Signed)
Concern for DM given history of obesity and HTN. Will check BMP and A1c today. Patient unable to give urine sample today - consider checking at follow up if A1c normal.

## 2016-01-27 ENCOUNTER — Encounter: Payer: Self-pay | Admitting: Family Medicine

## 2016-01-27 LAB — CBC
HCT: 42.4 % (ref 39.0–52.0)
HEMOGLOBIN: 13.7 g/dL (ref 13.0–17.0)
MCH: 25.9 pg — AB (ref 26.0–34.0)
MCHC: 32.3 g/dL (ref 30.0–36.0)
MCV: 80.2 fL (ref 78.0–100.0)
MPV: 10.9 fL (ref 8.6–12.4)
Platelets: 177 10*3/uL (ref 150–400)
RBC: 5.29 MIL/uL (ref 4.22–5.81)
RDW: 15 % (ref 11.5–15.5)
WBC: 6.1 10*3/uL (ref 4.0–10.5)

## 2016-01-27 LAB — BASIC METABOLIC PANEL WITH GFR
BUN: 11 mg/dL (ref 7–25)
CO2: 28 mmol/L (ref 20–31)
Calcium: 9.6 mg/dL (ref 8.6–10.3)
Chloride: 105 mmol/L (ref 98–110)
Creat: 1.19 mg/dL (ref 0.60–1.35)
GFR, EST NON AFRICAN AMERICAN: 81 mL/min (ref >=60)
GFR, Est African American: >89 mL/min (ref >=60)
GLUCOSE: 99 mg/dL (ref 65–99)
POTASSIUM: 4.1 mmol/L (ref 3.5–5.3)
Sodium: 139 mmol/L (ref 135–146)

## 2016-01-27 LAB — LIPID PANEL
CHOL/HDL RATIO: 2.7 ratio (ref <=5.0)
Cholesterol: 115 mg/dL — ABNORMAL LOW (ref 125–200)
HDL: 43 mg/dL (ref >=40)
LDL CALC: 43 mg/dL (ref <130)
Triglycerides: 146 mg/dL (ref <150)
VLDL: 29 mg/dL (ref <30)

## 2016-06-26 ENCOUNTER — Encounter (HOSPITAL_BASED_OUTPATIENT_CLINIC_OR_DEPARTMENT_OTHER): Payer: Self-pay | Admitting: Emergency Medicine

## 2016-06-26 ENCOUNTER — Emergency Department (HOSPITAL_BASED_OUTPATIENT_CLINIC_OR_DEPARTMENT_OTHER)
Admission: EM | Admit: 2016-06-26 | Discharge: 2016-06-26 | Disposition: A | Discharge status: Home / Self Care | Payer: BLUE CROSS/BLUE SHIELD | Attending: Emergency Medicine | Admitting: Emergency Medicine

## 2016-06-26 DIAGNOSIS — J029 Acute pharyngitis, unspecified: Secondary | ICD-10-CM | POA: Insufficient documentation

## 2016-06-26 DIAGNOSIS — Z79899 Other long term (current) drug therapy: Secondary | ICD-10-CM | POA: Insufficient documentation

## 2016-06-26 DIAGNOSIS — J45909 Unspecified asthma, uncomplicated: Secondary | ICD-10-CM | POA: Diagnosis not present

## 2016-06-26 DIAGNOSIS — J040 Acute laryngitis: Secondary | ICD-10-CM | POA: Diagnosis not present

## 2016-06-26 DIAGNOSIS — I10 Essential (primary) hypertension: Secondary | ICD-10-CM | POA: Diagnosis not present

## 2016-06-26 DIAGNOSIS — F329 Major depressive disorder, single episode, unspecified: Secondary | ICD-10-CM | POA: Insufficient documentation

## 2016-06-26 DIAGNOSIS — J060 Acute laryngopharyngitis: Secondary | ICD-10-CM

## 2016-06-26 LAB — RAPID STREP SCREEN (MED CTR MEBANE ONLY): Streptococcus, Group A Screen (Direct): NEGATIVE
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
## 2016-06-26 NOTE — Discharge Instructions (Signed)
Continue taking your allergy medications. Add on flonase. Buy over the counter chloraseptic spray for your throat. Follow up with your PCP if symptoms do not improve.    Pharyngitis Pharyngitis is redness, pain, and swelling (inflammation) of your pharynx.  CAUSES  Pharyngitis is usually caused by infection. Most of the time, these infections are from viruses (viral) and are part of a cold. However, sometimes pharyngitis is caused by bacteria (bacterial). Pharyngitis can also be caused by allergies. Viral pharyngitis may be spread from person to person by coughing, sneezing, and personal items or utensils (cups, forks, spoons, toothbrushes). Bacterial pharyngitis may be spread from person to person by more intimate contact, such as kissing.  SIGNS AND SYMPTOMS  Symptoms of pharyngitis include:   Sore throat.   Tiredness (fatigue).   Low-grade fever.   Headache.  Joint pain and muscle aches.  Skin rashes.  Swollen lymph nodes.  Plaque-like film on throat or tonsils (often seen with bacterial pharyngitis). DIAGNOSIS  Your health care provider will ask you questions about your illness and your symptoms. Your medical history, along with a physical exam, is often all that is needed to diagnose pharyngitis. Sometimes, a rapid strep test is done. Other lab tests may also be done, depending on the suspected cause.  TREATMENT  Viral pharyngitis will usually get better in 3-4 days without the use of medicine. Bacterial pharyngitis is treated with medicines that kill germs (antibiotics).  HOME CARE INSTRUCTIONS   Drink enough water and fluids to keep your urine clear or pale yellow.   Only take over-the-counter or prescription medicines as directed by your health care provider:   If you are prescribed antibiotics, make sure you finish them even if you start to feel better.   Do not take aspirin.   Get lots of rest.   Gargle with 8 oz of salt water ( tsp of salt per 1 qt of  water) as often as every 1-2 hours to soothe your throat.   Throat lozenges (if you are not at risk for choking) or sprays may be used to soothe your throat. SEEK MEDICAL CARE IF:   You have large, tender lumps in your neck.  You have a rash.  You cough up green, yellow-Tout, or bloody spit. SEEK IMMEDIATE MEDICAL CARE IF:   Your neck becomes stiff.  You drool or are unable to swallow liquids.  You vomit or are unable to keep medicines or liquids down.  You have severe pain that does not go away with the use of recommended medicines.  You have trouble breathing (not caused by a stuffy nose). MAKE SURE YOU:   Understand these instructions.  Will watch your condition.  Will get help right away if you are not doing well or get worse.   This information is not intended to replace advice given to you by your health care provider. Make sure you discuss any questions you have with your health care provider.   Document Released: 11/29/2005 Document Revised: 09/19/2013 Document Reviewed: 08/06/2013 Elsevier Interactive Patient Education [employer]2016 [employer] Inc.

## 2016-06-26 NOTE — ED Notes (Signed)
Sore throat since Thursday. Pt states he is unable to talk due to swollen tonsils. No meds taken at home, denies cough, congestion, headache.

## 2016-06-29 LAB — CULTURE, GROUP A STREP (THRC)

## 2016-10-27 ENCOUNTER — Ambulatory Visit (HOSPITAL_COMMUNITY)
Admission: EM | Admit: 2016-10-27 | Discharge: 2016-10-27 | Disposition: A | Discharge status: Home / Self Care | Payer: BLUE CROSS/BLUE SHIELD | Attending: Family Medicine | Admitting: Family Medicine

## 2016-10-27 ENCOUNTER — Encounter (HOSPITAL_COMMUNITY): Payer: Self-pay | Admitting: Emergency Medicine

## 2016-10-27 DIAGNOSIS — J4520 Mild intermittent asthma, uncomplicated: Secondary | ICD-10-CM | POA: Diagnosis not present

## 2016-10-27 MED ORDER — ALBUTEROL SULFATE (2.5 MG/3ML) 0.083% IN NEBU
5.0000 mg | INHALATION_SOLUTION | Freq: Once | RESPIRATORY_TRACT | Status: AC
  Administered 2016-10-27: 5 mg via RESPIRATORY_TRACT

## 2016-10-27 MED ORDER — IPRATROPIUM BROMIDE 0.02 % IN SOLN
0.5000 mg | Freq: Once | RESPIRATORY_TRACT | Status: AC
  Administered 2016-10-27: 0.5 mg via RESPIRATORY_TRACT

## 2016-10-27 MED ORDER — ALBUTEROL SULFATE HFA 108 (90 BASE) MCG/ACT IN AERS: 2.0000 | INHALATION_SPRAY | Freq: Four times a day (QID) | RESPIRATORY_TRACT | 1 refills | Status: DC | PRN

## 2016-10-27 MED ORDER — METHYLPREDNISOLONE ACETATE 80 MG/ML IJ SUSP
80.0000 mg | Freq: Once | INTRAMUSCULAR | Status: AC
  Administered 2016-10-27: 80 mg via INTRAMUSCULAR

## 2016-10-27 MED ORDER — ALBUTEROL SULFATE (2.5 MG/3ML) 0.083% IN NEBU
INHALATION_SOLUTION | RESPIRATORY_TRACT | Status: AC
  Filled 2016-10-27: qty 6

## 2016-10-27 MED ORDER — IPRATROPIUM BROMIDE 0.02 % IN SOLN
RESPIRATORY_TRACT | Status: AC
  Filled 2016-10-27: qty 2.5

## 2016-10-27 MED ORDER — METHYLPREDNISOLONE ACETATE 80 MG/ML IJ SUSP
INTRAMUSCULAR | Status: AC
  Filled 2016-10-27: qty 1

## 2016-10-27 NOTE — ED Triage Notes (Signed)
Patient reports a cough when talking,  Reports breathing is a little more difficult than usual.   Reports being sick 1 1/2 weeks ago .  Reports having runny nose, chest cold.

## 2016-10-27 NOTE — ED Provider Notes (Signed)
[facility]    CSN: [id number] Arrival date & time: 10/27/16  1939     History   Chief Complaint Chief Complaint  Patient presents with  . Asthma    HPI Scott Patterson is a 30 y.o. male.   The history is provided by the patient.  Asthma  This is a chronic problem. The current episode started more than 2 days ago. The problem has not changed since onset.Pertinent negatives include no shortness of breath. The symptoms are aggravated by smoking and coughing.    Past Medical History:  Diagnosis Date  . Anxiety   . Asthma   . Depression   . Hypertension     Patient Active Problem List   Diagnosis Date Noted  . Polyuria 01/26/2016  . Depression 01/11/2014  . Dysuria 09/08/2012  . Genital herpes 04/02/2011  . ESSENTIAL HYPERTENSION, BENIGN 12/11/2010  . OBESITY, NOS 02/09/2007  . Allergic rhinitis 02/09/2007  . Asthma, moderate persistent 02/09/2007    Past Surgical History:  Procedure Laterality Date  . None         Home Medications    Prior to Admission medications   Medication Sig Start Date End Date Taking? Authorizing Provider  cetirizine (ZYRTEC) 10 MG tablet Take 1 tablet (10 mg total) by mouth daily. 01/26/16  Yes Ardith Darkaleb M Parker, MD  hydrochlorothiazide (HYDRODIURIL) 12.5 MG tablet Take 1 tablet (12.5 mg total) by mouth daily. 01/26/16  Yes Ardith Darkaleb M Parker, MD  cyclobenzaprine (FLEXERIL) 10 MG tablet Take 1 tablet (10 mg total) by mouth 2 (two) times daily as needed for muscle spasms. 06/30/15   Teressa LowerVrinda Pickering, NP  diazepam (VALIUM) 10 MG tablet Take 10 mg by mouth once.    Historical Provider, MD  ibuprofen (ADVIL,MOTRIN) 800 MG tablet Take 1 tablet (800 mg total) by mouth 3 (three) times daily. 06/30/15   Teressa LowerVrinda Pickering, NP  ondansetron (ZOFRAN ODT) 4 MG disintegrating tablet 4mg  ODT q4 hours prn nausea/vomit 03/03/15   Blane OharaJoshua Zavitz, MD    Family History Family History  Problem Relation Age of Onset  . Depression Mother   . Hypertension  Mother   . Alcohol abuse Father   . Drug abuse Father   . Stroke Father   . Hypertension Father   . Alcohol abuse Paternal Uncle   . Hypertension Brother     Social History Social History  Substance Use Topics  . Smoking status: Never Smoker  . Smokeless tobacco: Never Used  . Alcohol use No     Allergies   Patient has no known allergies.   Review of Systems Review of Systems  Constitutional: Negative.   HENT: Negative.   Respiratory: Positive for cough and chest tightness. Negative for shortness of breath and wheezing.   Cardiovascular: Negative.   All other systems reviewed and are negative.    Physical Exam Triage Vital Signs ED Triage Vitals [10/27/16 2007]  Enc Vitals Group     BP 126/79     Pulse Rate 83     Resp 20     Temp 98.2 F (36.8 C)     Temp Source Oral     SpO2 97 %     Weight      Height      Head Circumference      Peak Flow      Pain Score      Pain Loc      Pain Edu?      Excl. in GC?  No data found.   Updated Vital Signs BP 126/79 (BP Location: Left Arm)   Pulse 83   Temp 98.2 F (36.8 C) (Oral)   Resp 20   SpO2 97%   Visual Acuity Right Eye Distance:   Left Eye Distance:   Bilateral Distance:    Right Eye Near:   Left Eye Near:    Bilateral Near:     Physical Exam  Constitutional: He is oriented to person, place, and time. He appears well-developed and well-nourished.  HENT:  Right Ear: External ear normal.  Left Ear: External ear normal.  Mouth/Throat: Oropharynx is clear and moist.  Eyes: EOM are normal. Pupils are equal, round, and reactive to light.  Neck: Normal range of motion. Neck supple.  Pulmonary/Chest: Effort normal and breath sounds normal. He has no wheezes.  Lymphadenopathy:    He has no cervical adenopathy.  Neurological: He is alert and oriented to person, place, and time.  Skin: Skin is warm and dry.  Nursing note and vitals reviewed.    UC Treatments / Results  Labs (all labs ordered  are listed, but only abnormal results are displayed) Labs Reviewed - No data to display  EKG  EKG Interpretation None       Radiology No results found.  Procedures Procedures (including critical care time)  Medications Ordered in UC Medications - No data to display   Initial Impression / Assessment and Plan / UC Course  I have reviewed the triage vital signs and the nursing notes.  Pertinent labs & imaging results that were available during my care of the patient were reviewed by me and considered in my medical decision making (see chart for details).  Clinical Course     Sx improved after neb.  Final Clinical Impressions(s) / UC Diagnoses   Final diagnoses:  None    New Prescriptions New Prescriptions   No medications on file     Linna HoffJames D Kindl, MD 10/27/16 2059

## 2016-12-12 ENCOUNTER — Emergency Department (HOSPITAL_BASED_OUTPATIENT_CLINIC_OR_DEPARTMENT_OTHER)
Admission: EM | Admit: 2016-12-12 | Discharge: 2016-12-12 | Disposition: A | Discharge status: Home / Self Care | Payer: BLUE CROSS/BLUE SHIELD | Attending: Emergency Medicine | Admitting: Emergency Medicine

## 2016-12-12 ENCOUNTER — Encounter (HOSPITAL_BASED_OUTPATIENT_CLINIC_OR_DEPARTMENT_OTHER): Payer: Self-pay | Admitting: Emergency Medicine

## 2016-12-12 DIAGNOSIS — Z79899 Other long term (current) drug therapy: Secondary | ICD-10-CM | POA: Insufficient documentation

## 2016-12-12 DIAGNOSIS — H5713 Ocular pain, bilateral: Secondary | ICD-10-CM | POA: Diagnosis present

## 2016-12-12 DIAGNOSIS — J309 Allergic rhinitis, unspecified: Secondary | ICD-10-CM | POA: Diagnosis not present

## 2016-12-12 DIAGNOSIS — I10 Essential (primary) hypertension: Secondary | ICD-10-CM | POA: Insufficient documentation

## 2016-12-12 DIAGNOSIS — H1013 Acute atopic conjunctivitis, bilateral: Secondary | ICD-10-CM | POA: Diagnosis not present

## 2016-12-12 MED ORDER — MOMETASONE FUROATE 50 MCG/ACT NA SUSP: 2.0000 | Freq: Every day | NASAL | 12 refills | Status: DC

## 2016-12-12 MED ORDER — CETIRIZINE HCL 10 MG PO TABS: 10.0000 mg | ORAL_TABLET | Freq: Every day | ORAL | 5 refills | Status: DC

## 2016-12-12 NOTE — ED Triage Notes (Signed)
Patient reports that he is having pain to both his eyes  - no redness noted at this time

## 2016-12-12 NOTE — ED Notes (Signed)
Pt given d/c instructions as per chart. Rx x 2. Verbalizes understanding. No questions. 

## 2016-12-12 NOTE — ED Provider Notes (Signed)
[facility]   CSN: [id number] Arrival date & time: 12/12/16  1416  By signing my name below, I, Linna Darner, attest that this documentation has been prepared under the direction and in the presence of physician practitioner, Loren Racer, MD. Electronically Signed: Linna Darner, Scribe. 12/12/2016. 3:16 PM.  History   Chief Complaint Chief Complaint  Patient presents with  . Eye Pain    The history is provided by the patient. No language interpreter was used.     HPI Comments: Scott Patterson is a 30 y.o. male who presents to the Emergency Department complaining of sudden onset, constant, bilateral eye pain for the last several weeks. He reports associated eye itching, eye redness, eye discharge, and nasal congestion. He states his right eye symptoms are more significant than his left. He is unable to describe the quality of his eye discharge but notes it is most prominent in the morning after waking up. He is concerned for pink eye and notes he was around several children who had pink eye a few weeks ago. He ran out of zyrtec a couple of weeks ago. He denies recent unusual outdoor activity or animal exposure. He does not weld or do any related activities. He denies fever, chills, or any other associated symptoms.  Past Medical History:  Diagnosis Date  . Anxiety   . Asthma   . Depression   . Hypertension     Patient Active Problem List   Diagnosis Date Noted  . Polyuria 01/26/2016  . Depression 01/11/2014  . Dysuria 09/08/2012  . Genital herpes 04/02/2011  . ESSENTIAL HYPERTENSION, BENIGN 12/11/2010  . OBESITY, NOS 02/09/2007  . Allergic rhinitis 02/09/2007  . Asthma, moderate persistent 02/09/2007    Past Surgical History:  Procedure Laterality Date  . None         Home Medications    Prior to Admission medications   Medication Sig Start Date End Date Taking? Authorizing Provider  albuterol (PROVENTIL HFA;VENTOLIN HFA) 108 (90  Base) MCG/ACT inhaler Inhale 2 puffs into the lungs every 6 (six) hours as needed for wheezing or shortness of breath. 10/27/16   Linna Hoff, MD  cetirizine (ZYRTEC) 10 MG tablet Take 1 tablet (10 mg total) by mouth daily. 12/12/16   Loren Racer, MD  cyclobenzaprine (FLEXERIL) 10 MG tablet Take 1 tablet (10 mg total) by mouth 2 (two) times daily as needed for muscle spasms. 06/30/15   Teressa Lower, NP  diazepam (VALIUM) 10 MG tablet Take 10 mg by mouth once.    Historical Provider, MD  hydrochlorothiazide (HYDRODIURIL) 12.5 MG tablet Take 1 tablet (12.5 mg total) by mouth daily. 01/26/16   Ardith Dark, MD  ibuprofen (ADVIL,MOTRIN) 800 MG tablet Take 1 tablet (800 mg total) by mouth 3 (three) times daily. 06/30/15   Teressa Lower, NP  mometasone (NASONEX) 50 MCG/ACT nasal spray Place 2 sprays into the nose daily. 12/12/16   Loren Racer, MD  ondansetron (ZOFRAN ODT) 4 MG disintegrating tablet 4mg  ODT q4 hours prn nausea/vomit 03/03/15   Blane Ohara, MD    Family History Family History  Problem Relation Age of Onset  . Depression Mother   . Hypertension Mother   . Alcohol abuse Father   . Drug abuse Father   . Stroke Father   . Hypertension Father   . Alcohol abuse Paternal Uncle   . Hypertension Brother     Social History Social History  Substance Use Topics  . Smoking status: Never  Smoker  . Smokeless tobacco: Never Used  . Alcohol use No     Allergies   Patient has no known allergies.   Review of Systems Review of Systems  Constitutional: Negative for chills, fatigue and fever.  HENT: Positive for congestion and rhinorrhea. Negative for facial swelling, sinus pressure and sore throat.   Eyes: Positive for pain, discharge and itching. Negative for photophobia, redness and visual disturbance.  Respiratory: Negative for cough, chest tightness, shortness of breath and wheezing.   Cardiovascular: Negative for chest pain, palpitations and leg swelling.    Gastrointestinal: Negative for abdominal pain, constipation, diarrhea, nausea and vomiting.  Musculoskeletal: Negative for back pain and neck pain.  Skin: Negative for rash and wound.  Neurological: Negative for dizziness, weakness, light-headedness, numbness and headaches.  All other systems reviewed and are negative.    Physical Exam Updated Vital Signs BP 148/97 (BP Location: Right Arm)   Pulse 80   Temp 98.5 F (36.9 C) (Oral)   Resp 18   Ht 6\' 2"  (1.88 m)   Wt 265 lb (120.2 kg)   SpO2 100%   BMI 34.02 kg/m   Physical Exam  Constitutional: He is oriented to person, place, and time. He appears well-developed and well-nourished.  HENT:  Head: Normocephalic and atraumatic.  Mouth/Throat: Oropharynx is clear and moist. No oropharyngeal exudate.  Bilateral nasal mucosal edema.  Eyes: EOM are normal. Pupils are equal, round, and reactive to light. Right eye exhibits discharge. Left eye exhibits discharge.  Clear discharge. Mild conjunctival injection bilaterally. No foreign bodies visualized. No photophobia.  Neck: Normal range of motion. Neck supple.  Cardiovascular: Normal rate and regular rhythm.  Exam reveals no gallop and no friction rub.   No murmur heard. Pulmonary/Chest: Effort normal and breath sounds normal.  Abdominal: Soft. Bowel sounds are normal. There is no tenderness. There is no rebound and no guarding.  Musculoskeletal: Normal range of motion. He exhibits no edema or tenderness.  Lymphadenopathy:    He has no cervical adenopathy.  Neurological: He is alert and oriented to person, place, and time.  Skin: Skin is warm and dry. No rash noted. No erythema.  Psychiatric: He has a normal mood and affect. His behavior is normal.  Nursing note and vitals reviewed.    ED Treatments / Results  Labs (all labs ordered are listed, but only abnormal results are displayed) Labs Reviewed - No data to display  EKG  EKG Interpretation None       Radiology No  results found.  Procedures Procedures (including critical care time)  DIAGNOSTIC STUDIES: Oxygen Saturation is 100% on RA, normal by my interpretation.    COORDINATION OF CARE: 3:21 PM Discussed treatment plan with pt at bedside and pt agreed to plan.  Medications Ordered in ED Medications - No data to display   Initial Impression / Assessment and Plan / ED Course  I have reviewed the triage vital signs and the nursing notes.  Pertinent labs & imaging results that were available during my care of the patient were reviewed by me and considered in my medical decision making (see chart for details).  Clinical Course     Likely allergic conjunctivitis. Less likely viral conjunctivitis. Started back on Zyrtec. Recommend natural tears.  Final Clinical Impressions(s) / ED Diagnoses   Final diagnoses:  Allergic conjunctivitis and rhinitis, bilateral    New Prescriptions New Prescriptions   MOMETASONE (NASONEX) 50 MCG/ACT NASAL SPRAY    Place 2 sprays into the nose daily.  I personally performed the services described in this documentation, which was scribed in my presence. The recorded information has been reviewed and is accurate.      Loren Raceravid Jonetta Dagley, MD 12/12/16 [phone]

## 2017-01-12 ENCOUNTER — Other Ambulatory Visit: Payer: Self-pay | Admitting: Family Medicine

## 2017-01-12 DIAGNOSIS — I10 Essential (primary) hypertension: Secondary | ICD-10-CM

## 2017-01-20 ENCOUNTER — Ambulatory Visit (INDEPENDENT_AMBULATORY_CARE_PROVIDER_SITE_OTHER): Payer: BLUE CROSS/BLUE SHIELD | Admitting: Student

## 2017-01-20 ENCOUNTER — Encounter: Payer: Self-pay | Admitting: Student

## 2017-01-20 VITALS — BP 110/82 | HR 100 | Temp 98.4°F | Ht 74.0 in | Wt 268.0 lb

## 2017-01-20 DIAGNOSIS — Z114 Encounter for screening for human immunodeficiency virus [HIV]: Secondary | ICD-10-CM

## 2017-01-20 DIAGNOSIS — R350 Frequency of micturition: Secondary | ICD-10-CM | POA: Insufficient documentation

## 2017-01-20 DIAGNOSIS — Z0000 Encounter for general adult medical examination without abnormal findings: Secondary | ICD-10-CM | POA: Diagnosis not present

## 2017-01-20 DIAGNOSIS — R358 Other polyuria: Secondary | ICD-10-CM

## 2017-01-20 DIAGNOSIS — I10 Essential (primary) hypertension: Secondary | ICD-10-CM | POA: Diagnosis not present

## 2017-01-20 DIAGNOSIS — J3089 Other allergic rhinitis: Secondary | ICD-10-CM | POA: Diagnosis not present

## 2017-01-20 DIAGNOSIS — R3589 Other polyuria: Secondary | ICD-10-CM

## 2017-01-20 LAB — POCT GLYCOSYLATED HEMOGLOBIN (HGB A1C): HEMOGLOBIN A1C: 5.5

## 2017-01-20 LAB — POCT URINALYSIS DIPSTICK
BILIRUBIN UA: NEGATIVE
Glucose, UA: NEGATIVE
KETONES UA: NEGATIVE
LEUKOCYTES UA: NEGATIVE
Nitrite, UA: NEGATIVE
PH UA: 6.5
PROTEIN UA: NEGATIVE
Spec Grav, UA: 1.025
Urobilinogen, UA: 0.2

## 2017-01-20 LAB — BASIC METABOLIC PANEL WITH GFR
BUN: 14 mg/dL (ref 7–25)
CALCIUM: 9.6 mg/dL (ref 8.6–10.3)
CHLORIDE: 102 mmol/L (ref 98–110)
CO2: 28 mmol/L (ref 20–31)
CREATININE: 1.43 mg/dL — AB (ref 0.60–1.35)
GFR, EST NON AFRICAN AMERICAN: 65 mL/min (ref >=60)
GFR, Est African American: 75 mL/min (ref >=60)
Glucose, Bld: 112 mg/dL — ABNORMAL HIGH (ref 65–99)
Potassium: 4.1 mmol/L (ref 3.5–5.3)
SODIUM: 140 mmol/L (ref 135–146)

## 2017-01-20 LAB — LIPID PANEL
Cholesterol: 129 mg/dL (ref <200)
HDL: 60 mg/dL (ref >40)
LDL CALC: 45 mg/dL (ref <100)
Total CHOL/HDL Ratio: 2.2 Ratio (ref <5.0)
Triglycerides: 120 mg/dL (ref <150)
VLDL: 24 mg/dL (ref <30)

## 2017-01-20 MED ORDER — ALBUTEROL SULFATE HFA 108 (90 BASE) MCG/ACT IN AERS: 2.0000 | INHALATION_SPRAY | Freq: Four times a day (QID) | RESPIRATORY_TRACT | 4 refills | Status: DC | PRN

## 2017-01-20 MED ORDER — MOMETASONE FUROATE 50 MCG/ACT NA SUSP: 2.0000 | Freq: Every day | NASAL | 12 refills | Status: DC

## 2017-01-20 MED ORDER — HYDROCHLOROTHIAZIDE 12.5 MG PO TABS: 12.5000 mg | ORAL_TABLET | Freq: Every day | ORAL | 5 refills | Status: DC

## 2017-01-20 MED ORDER — CETIRIZINE HCL 10 MG PO TABS: 10.0000 mg | ORAL_TABLET | Freq: Every day | ORAL | 5 refills | Status: DC

## 2017-01-20 NOTE — Assessment & Plan Note (Deleted)
NO dysuria, abdominal pain, penile discharge or fevers makes urinary tract infection or STD less likely. Likely due to HCTZ use. However, will obtain work up labs per patient request - UA - urine GC/CT

## 2017-01-20 NOTE — Progress Notes (Signed)
   Subjective:    Patient ID: Scott Patterson, male    DOB: 08-07-1986, 31 y.o.   MRN: [id number]   CC: physical exam, increased urinary frequency  HPI: [name] presents for physical exam and reports increased urinary frequency   Increased urinary frequency - has been ongoing for at least one year - denies dysuria, abdominal pain, fevers, penile discharge or pain - he is sexually active with one long time male partner, denies male partners  HTN - takes HCTZ for blood pressure - denies chest pain, SOB  Obesity - has been trying to lose weight and has lost 18 lbs over the last year - he does not exercise but has tried to limit junk food and soda  Smoking status reviewed - non smoker - social drinker - denies drug use  Review of Systems  Per HPI, else denies recent illness, fever, changes in vision, chest pain, shortness of breath, abdominal pain, N/V/D, weakness   Objective:  BP 110/82   Pulse 100   Temp 98.4 F (36.9 C) (Oral)   Ht 6\' 2"  (1.88 m)   Wt 268 lb (121.6 kg)   SpO2 100%   BMI 34.41 kg/m  Vitals and nursing note reviewed  General: NAD Cardiac: RRR Respiratory: CTAB, normal effort Abdomen: obese, soft, nontender, nondistended, Bowel sounds present Extremities: no edema or cyanosis.  Skin: warm and dry, no rashes noted Neuro: alert and oriented, no focal deficits   Assessment & Plan:    Polyuria No dysuria, abdominal pain, penile discharge or fevers makes urinary tract infection or STD less likely. Likely due to HCTZ use. Last hgb A1c in 01/2016 was 5.6.  However, will obtain work up labs per patient request - UA - urine GC/CT - repeat A1c to rule out DM causing  OBESITY, NOS Discussed healthy diet and exercise - lipid panel today - a1c  ESSENTIAL HYPERTENSION, BENIGN Blood pressure stable - HCTZ refilled - BMP today  Healthcare maintenance HIV screening today. Patient refused flu shot    Yonas Bunda A. Kennon RoundsHaney MD,  MS Family Medicine Resident PGY-3 Pager [phone]

## 2017-01-20 NOTE — Assessment & Plan Note (Signed)
Discussed healthy diet and exercise - lipid panel today - a1c

## 2017-01-20 NOTE — Assessment & Plan Note (Signed)
No dysuria, abdominal pain, penile discharge or fevers makes urinary tract infection or STD less likely. Likely due to HCTZ use. Last hgb A1c in 01/2016 was 5.6.  However, will obtain work up labs per patient request - UA - urine GC/CT - repeat A1c to rule out DM causing

## 2017-01-20 NOTE — Assessment & Plan Note (Signed)
HIV screening today. Patient refused flu shot

## 2017-01-20 NOTE — Patient Instructions (Signed)
Follow up as needed with PCP You will be called about any abnormal results If you have any questions or concerns, call the office at [phone]

## 2017-01-20 NOTE — Assessment & Plan Note (Signed)
Blood pressure stable - HCTZ refilled - BMP today

## 2017-01-21 ENCOUNTER — Encounter: Payer: Self-pay | Admitting: Student

## 2017-01-21 ENCOUNTER — Telehealth: Payer: Self-pay | Admitting: Student

## 2017-01-21 LAB — URINE CYTOLOGY ANCILLARY ONLY
Chlamydia: NEGATIVE
Neisseria Gonorrhea: NEGATIVE

## 2017-01-21 LAB — HIV ANTIBODY (ROUTINE TESTING W REFLEX): HIV: NONREACTIVE

## 2017-01-21 NOTE — Telephone Encounter (Signed)
Please inform patient that his GC/Ct was negative. The remainder of his labs were also normal and a letter was sent

## 2017-07-06 ENCOUNTER — Encounter (HOSPITAL_COMMUNITY): Payer: Self-pay

## 2017-07-06 ENCOUNTER — Emergency Department (HOSPITAL_COMMUNITY)
Admission: EM | Admit: 2017-07-06 | Discharge: 2017-07-06 | Disposition: A | Discharge status: Home / Self Care | Payer: BLUE CROSS/BLUE SHIELD | Attending: Emergency Medicine | Admitting: Emergency Medicine

## 2017-07-06 DIAGNOSIS — S098XXA Other specified injuries of head, initial encounter: Secondary | ICD-10-CM | POA: Insufficient documentation

## 2017-07-06 DIAGNOSIS — J45909 Unspecified asthma, uncomplicated: Secondary | ICD-10-CM | POA: Insufficient documentation

## 2017-07-06 DIAGNOSIS — Y929 Unspecified place or not applicable: Secondary | ICD-10-CM | POA: Insufficient documentation

## 2017-07-06 DIAGNOSIS — Y93B9 Activity, other involving muscle strengthening exercises: Secondary | ICD-10-CM | POA: Diagnosis not present

## 2017-07-06 DIAGNOSIS — Y939 Activity, unspecified: Secondary | ICD-10-CM | POA: Insufficient documentation

## 2017-07-06 DIAGNOSIS — Y999 Unspecified external cause status: Secondary | ICD-10-CM | POA: Insufficient documentation

## 2017-07-06 DIAGNOSIS — I10 Essential (primary) hypertension: Secondary | ICD-10-CM | POA: Diagnosis not present

## 2017-07-06 DIAGNOSIS — S0990XA Unspecified injury of head, initial encounter: Secondary | ICD-10-CM

## 2017-07-06 DIAGNOSIS — W228XXA Striking against or struck by other objects, initial encounter: Secondary | ICD-10-CM | POA: Diagnosis not present

## 2017-07-06 MED ORDER — ACETAMINOPHEN 500 MG PO TABS
1000.0000 mg | ORAL_TABLET | Freq: Once | ORAL | Status: AC
  Administered 2017-07-06: 1000 mg via ORAL
  Filled 2017-07-06: qty 2

## 2017-07-06 NOTE — ED Provider Notes (Signed)
[facility]-EMERGENCY DEPT Provider Note   CSN: [id number] Arrival date & time: 07/06/17  1121     History   Chief Complaint Chief Complaint  Patient presents with  . Head Injury    HPI Scott Patterson is a 31 y.o. male.  HPI   Patient is a 31 year old male with no pertinent past medical history presents to the ED with complaint of head injury, onset prior to arrival. Patient states he was working out at the gym when a 20 pound weight fell off of the machine onto the back of his head. Denies LOC. Reports initial pain and mild intermittent dizziness but notes his dizziness has since resolved. Denies taking any medications prior to arrival. Denies visual changes, neck pain/stiffness, nausea, vomiting, numbness, tingling, weakness. Denies use of anticoagulants.  Past Medical History:  Diagnosis Date  . Anxiety   . Asthma   . Depression   . Hypertension     Patient Active Problem List   Diagnosis Date Noted  . Increased urinary frequency 01/20/2017  . Healthcare maintenance 01/20/2017  . Polyuria 01/26/2016  . Depression 01/11/2014  . Genital herpes 04/02/2011  . ESSENTIAL HYPERTENSION, BENIGN 12/11/2010  . OBESITY, NOS 02/09/2007  . Allergic rhinitis 02/09/2007  . Asthma, moderate persistent 02/09/2007    Past Surgical History:  Procedure Laterality Date  . None         Home Medications    Prior to Admission medications   Medication Sig Start Date End Date Taking? Authorizing Provider  albuterol (PROVENTIL HFA;VENTOLIN HFA) 108 (90 Base) MCG/ACT inhaler Inhale 2 puffs into the lungs every 6 (six) hours as needed for wheezing or shortness of breath. 01/20/17   Bonney AidHaney, Alyssa A, MD  cetirizine (ZYRTEC) 10 MG tablet Take 1 tablet (10 mg total) by mouth daily. 01/20/17   Bonney AidHaney, Alyssa A, MD  cyclobenzaprine (FLEXERIL) 10 MG tablet Take 1 tablet (10 mg total) by mouth 2 (two) times daily as needed for muscle spasms. 06/30/15   Teressa LowerPickering, Vrinda, NP  diazepam (VALIUM) 10 MG tablet  Take 10 mg by mouth once.    [provider]  hydrochlorothiazide (HYDRODIURIL) 12.5 MG tablet Take 1 tablet (12.5 mg total) by mouth daily. 01/20/17   Bonney AidHaney, Alyssa A, MD  ibuprofen (ADVIL,MOTRIN) 800 MG tablet Take 1 tablet (800 mg total) by mouth 3 (three) times daily. 06/30/15   Teressa LowerPickering, Vrinda, NP  mometasone (NASONEX) 50 MCG/ACT nasal spray Place 2 sprays into the nose daily. 01/20/17   Bonney AidHaney, Alyssa A, MD  ondansetron (ZOFRAN ODT) 4 MG disintegrating tablet 4mg  ODT q4 hours prn nausea/vomit 03/03/15   Blane OharaZavitz, Joshua, MD    Family History Family History  Problem Relation Age of Onset  . Depression Mother   . Hypertension Mother   . Alcohol abuse Father   . Drug abuse Father   . Stroke Father   . Hypertension Father   . Alcohol abuse Paternal Uncle   . Hypertension Brother     Social History Social History  Substance Use Topics  . Smoking status: Never Smoker  . Smokeless tobacco: Never Used  . Alcohol use No     Allergies   Patient has no known allergies.   Review of Systems Review of Systems  Neurological: Positive for headaches.  All other systems reviewed and are negative.    Physical Exam Updated Vital Signs BP 130/89   Pulse 75   Temp 98.1 F (36.7 C) (Oral)   Resp 18   Ht 6\' 2"  (1.88  m)   Wt 107 kg (236 lb)   SpO2 99%   BMI 30.30 kg/m   Physical Exam  Constitutional: He is oriented to person, place, and time. He appears well-developed and well-nourished. No distress.  HENT:  Head: Normocephalic and atraumatic. Head is without raccoon's eyes, without Battle's sign, without abrasion, without contusion and without laceration. Hair is normal.  Right Ear: Tympanic membrane normal. No hemotympanum.  Left Ear: Tympanic membrane normal. No hemotympanum.  Nose: Nose normal. No sinus tenderness, nasal deformity, septal deviation or nasal septal hematoma. No epistaxis.  Mouth/Throat: Uvula is midline, oropharynx is clear and moist and mucous membranes  are normal. No oropharyngeal exudate, posterior oropharyngeal edema, posterior oropharyngeal erythema or tonsillar abscesses.  Eyes: Pupils are equal, round, and reactive to light. Conjunctivae and EOM are normal. Right eye exhibits no discharge. Left eye exhibits no discharge. No scleral icterus.  Neck: Normal range of motion. Neck supple.  Cardiovascular: Normal rate, regular rhythm, normal heart sounds and intact distal pulses.   Pulmonary/Chest: Effort normal and breath sounds normal. No respiratory distress. He has no wheezes. He has no rales. He exhibits no tenderness.  Abdominal: Soft. He exhibits no distension. There is no tenderness.  Musculoskeletal: Normal range of motion. He exhibits no edema, tenderness or deformity.  No cervical, thoracic, or lumbar spine midline TTP. Full ROM of bilateral upper and lower extremities with 5/5 strength.   2+ radial and PT pulses. Sensation grossly intact.  Pt able to stand and ambulate without assistance, no ataxia noted.  Neurological: He is alert and oriented to person, place, and time. He has normal strength. No cranial nerve deficit or sensory deficit. Coordination and gait normal.  Skin: Skin is warm and dry. He is not diaphoretic.  Nursing note and vitals reviewed.    ED Treatments / Results  Labs (all labs ordered are listed, but only abnormal results are displayed) Labs Reviewed - No data to display  EKG  EKG Interpretation None       Radiology No results found.  Procedures Procedures (including critical care time)  Medications Ordered in ED Medications  acetaminophen (TYLENOL) tablet 1,000 mg (not administered)     Initial Impression / Assessment and Plan / ED Course  I have reviewed the triage vital signs and the nursing notes.  Pertinent labs & imaging results that were available during my care of the patient were reviewed by me and considered in my medical decision making (see chart for details).     Patient  presents with reported head injury that occurred prior to arrival. He reports a 20 pound weight dropped on the top of his head while he was working out at [employer] gym. Denies LOC. Reports mild intermittent dizziness on initial onset which has since resolved. VSS. Exam unremarkable. No evidence of head injury or trauma. No neuro deficits. Patient able to stand and ambulate without assistance. Patient's symptoms appear consistent with mild concussion associated with recent head injury. Patient without signs of suspected open or depressed skull fracture, signs of basilar skull fx, amnesia, vomiting, seizures, neuro deficits or and seen imaging at this time. Plan to discharge patient home with symptomatic treatment and head injury precautions. Discussed return precautions.  Final Clinical Impressions(s) / ED Diagnoses   Final diagnoses:  Injury of head, initial encounter    New Prescriptions New Prescriptions   No medications on file     Barrett Henleadeau, Nicole Elizabeth, Cordelia Poche-C 07/06/17 1352    Mancel BaleWentz, Elliott, MD 07/07/17 1104

## 2017-07-06 NOTE — ED Triage Notes (Signed)
Pt presents to the ed with complaints of dropping about a 20 pound weight on the back of his head while working out. Reports mild dizziness, denies any other symptoms. Pt is alert, oriented and ambulatory with a steady gait. Denies taking any blood thinners.

## 2017-07-06 NOTE — ED Notes (Signed)
Pt is in stable condition upon d/c and ambulates from ED. 

## 2017-07-06 NOTE — Discharge Instructions (Signed)
I recommend taking Tylenol and ibuprofen as prescribed over-the-counter, alternating between doses every 3 hours as needed for pain relief. You may also apply ice to affected area for 15-20 minutes 3-4 times daily for additional relief. Please follow up with a primary care provider from the Resource Guide provided below in 3-4 days if your symptoms have not improved. Please return to the Emergency Department if symptoms worsen or new onset of fever, dizziness, visual changes, neck pain/stiffness, numbness, tingling, weakness, vomiting, altered mental status, confusion.

## 2017-07-10 ENCOUNTER — Emergency Department (HOSPITAL_COMMUNITY)
Admission: EM | Admit: 2017-07-10 | Discharge: 2017-07-10 | Disposition: A | Discharge status: Home / Self Care | Payer: BLUE CROSS/BLUE SHIELD | Attending: Emergency Medicine | Admitting: Emergency Medicine

## 2017-07-10 ENCOUNTER — Encounter (HOSPITAL_COMMUNITY): Payer: Self-pay | Admitting: Emergency Medicine

## 2017-07-10 ENCOUNTER — Emergency Department (HOSPITAL_COMMUNITY): Payer: BLUE CROSS/BLUE SHIELD

## 2017-07-10 DIAGNOSIS — F329 Major depressive disorder, single episode, unspecified: Secondary | ICD-10-CM | POA: Diagnosis not present

## 2017-07-10 DIAGNOSIS — W208XXA Other cause of strike by thrown, projected or falling object, initial encounter: Secondary | ICD-10-CM | POA: Insufficient documentation

## 2017-07-10 DIAGNOSIS — Z79899 Other long term (current) drug therapy: Secondary | ICD-10-CM | POA: Diagnosis not present

## 2017-07-10 DIAGNOSIS — F419 Anxiety disorder, unspecified: Secondary | ICD-10-CM | POA: Insufficient documentation

## 2017-07-10 DIAGNOSIS — J45909 Unspecified asthma, uncomplicated: Secondary | ICD-10-CM | POA: Diagnosis not present

## 2017-07-10 DIAGNOSIS — S060X0A Concussion without loss of consciousness, initial encounter: Secondary | ICD-10-CM | POA: Diagnosis not present

## 2017-07-10 DIAGNOSIS — I10 Essential (primary) hypertension: Secondary | ICD-10-CM | POA: Insufficient documentation

## 2017-07-10 DIAGNOSIS — Y998 Other external cause status: Secondary | ICD-10-CM | POA: Insufficient documentation

## 2017-07-10 DIAGNOSIS — Y9389 Activity, other specified: Secondary | ICD-10-CM | POA: Diagnosis not present

## 2017-07-10 DIAGNOSIS — S0990XA Unspecified injury of head, initial encounter: Secondary | ICD-10-CM | POA: Diagnosis present

## 2017-07-10 DIAGNOSIS — Y929 Unspecified place or not applicable: Secondary | ICD-10-CM | POA: Insufficient documentation

## 2017-07-10 MED ORDER — HYDROCODONE-ACETAMINOPHEN 5-325 MG PO TABS: 1.0000 | ORAL_TABLET | ORAL | 0 refills | Status: DC | PRN

## 2017-07-10 MED ORDER — HYDROCODONE-ACETAMINOPHEN 5-325 MG PO TABS
2.0000 | ORAL_TABLET | Freq: Once | ORAL | Status: AC
  Administered 2017-07-10: 2 via ORAL
  Filled 2017-07-10: qty 2

## 2017-07-10 NOTE — ED Triage Notes (Signed)
Pt states he was seen in ED 3-4 days ago after being hit in head at the gym with broken equipment when lifting weights.  C/o worsening pain to top of head.  States it feels tight.  Also reports nausea last night and dizziness.

## 2017-07-10 NOTE — ED Notes (Signed)
Patient is A&Ox4 at this time.  Patient in no signs of distress.  Please see providers note for complete history and physical exam.  

## 2017-07-10 NOTE — ED Provider Notes (Signed)
[facility]-EMER[facility]Y DEPT Provider Note   CSN: [id number] Arrival date & time: 07/10/17  1511   By signing my name below, I, Clarisse Gouge, attest that this documentation has been prepared under the direction and in the presence of Rolan Bucco, MD. Electronically signed, Clarisse Gouge, ED Scribe. 07/10/17. 4:44 PM.   History   Chief Complaint Chief Complaint  Patient presents with  . head injury   The history is provided by the patient and medical records. No language interpreter was used.    Scott Patterson is a 31 y.o. male presenting to the Emergency Department concerning persistent superior head pain onset 5 days ago after a weight lifting bar fell onto the top of his head. Pt evaluated in [facility] ED 4 days ago and he notes worsened pain since this evaluation. Associated dizziness, nausea. He also notes some arm weakness that has subsided. He describes severe constant, throbbing, tightness that radiates all over the head from the crown. Pt has taken Tylenol without relief. Anticoagulant use denied. No LOC. No vomiting, numbness, neck pain, gait change or any other complaints noted at this time.   Past Medical History:  Diagnosis Date  . Anxiety   . Asthma   . Depression   . Hypertension     Patient Active Problem List   Diagnosis Date Noted  . Increased urinary frequency 01/20/2017  . Healthcare maintenance 01/20/2017  . Polyuria 01/26/2016  . Depression 01/11/2014  . Genital herpes 04/02/2011  . ESSENTIAL HYPERTENSION, BENIGN 12/11/2010  . OBESITY, NOS 02/09/2007  . Allergic rhinitis 02/09/2007  . Asthma, moderate persistent 02/09/2007    Past Surgical History:  Procedure Laterality Date  . None         Home Medications    Prior to Admission medications   Medication Sig Start Date End Date Taking? Authorizing Provider  albuterol (PROVENTIL HFA;VENTOLIN HFA) 108 (90 Base) MCG/ACT inhaler Inhale 2 puffs into the lungs every 6 (six) hours as needed for wheezing or  shortness of breath. 01/20/17   Bonney Aid, MD  cetirizine (ZYRTEC) 10 MG tablet Take 1 tablet (10 mg total) by mouth daily. 01/20/17   Bonney Aid, MD  cyclobenzaprine (FLEXERIL) 10 MG tablet Take 1 tablet (10 mg total) by mouth 2 (two) times daily as needed for muscle spasms. 06/30/15   Teressa Lower, NP  diazepam (VALIUM) 10 MG tablet Take 10 mg by mouth once.    [provider]  hydrochlorothiazide (HYDRODIURIL) 12.5 MG tablet Take 1 tablet (12.5 mg total) by mouth daily. 01/20/17   Bonney Aid, MD  HYDROcodone-acetaminophen (NORCO/VICODIN) 5-325 MG tablet Take 1-2 tablets by mouth every 4 (four) hours as needed. 07/10/17   Rolan Bucco, MD  ibuprofen (ADVIL,MOTRIN) 800 MG tablet Take 1 tablet (800 mg total) by mouth 3 (three) times daily. 06/30/15   Teressa Lower, NP  mometasone (NASONEX) 50 MCG/ACT nasal spray Place 2 sprays into the nose daily. 01/20/17   Bonney Aid, MD  ondansetron (ZOFRAN ODT) 4 MG disintegrating tablet 4mg  ODT q4 hours prn nausea/vomit 03/03/15   Blane Ohara, MD    Family History Family History  Problem Relation Age of Onset  . Depression Mother   . Hypertension Mother   . Alcohol abuse Father   . Drug abuse Father   . Stroke Father   . Hypertension Father   . Alcohol abuse Paternal Uncle   . Hypertension Brother     Social History Social History  Substance Use Topics  .  Smoking status: Never Smoker  . Smokeless tobacco: Never Used  . Alcohol use No     Allergies   Patient has no known allergies.   Review of Systems Review of Systems  Constitutional: Negative for chills, diaphoresis and fever.  Eyes: Negative for visual disturbance.  Gastrointestinal: Positive for nausea. Negative for vomiting.  Musculoskeletal: Negative for arthralgias, gait problem, neck pain and neck stiffness.  Skin: Negative for color change and wound.  Neurological: Positive for dizziness and headaches. Negative for syncope, weakness,  light-headedness and numbness.  Hematological: Does not bruise/bleed easily.  All other systems reviewed and are negative.    Physical Exam Updated Vital Signs BP 114/89 (BP Location: Right Arm)   Pulse 74   Temp 98 F (36.7 C) (Oral)   Resp 16   Ht 6\' 2"  (1.88 m)   Wt 240 lb (108.9 kg)   SpO2 99%   BMI 30.81 kg/m   Physical Exam  Constitutional: He is oriented to person, place, and time. He appears well-developed and well-nourished.  HENT:  Head: Normocephalic and atraumatic.  Right Ear: Tympanic membrane normal. No hemotympanum.  Left Ear: Tympanic membrane normal. No hemotympanum.  Tenderness to the parietal scalp area. No swelling or deformity. No hemotympanum.  Neck: Normal range of motion. Neck supple. No tracheal tenderness present.  No pain along the cervical spine.  Cardiovascular: Normal rate.   Pulmonary/Chest: Effort normal.  Neurological: He is alert and oriented to person, place, and time.  Motor 5/5 all extremities Sensation grossly intact to LT all extremities Finger to Nose intact, no pronator drift CN II-XII grossly intact Gait normal   Skin: Skin is warm and dry.  Psychiatric: He has a normal mood and affect.     ED Treatments / Results  DIAGNOSTIC STUDIES: Oxygen Saturation is 99% on RA, NL by my interpretation.    COORDINATION OF CARE: 4:26 PM-Discussed next steps with pt. Pt verbalized understanding and is agreeable with the plan. Will order C/T scan.   Labs (all labs ordered are listed, but only abnormal results are displayed) Labs Reviewed - No data to display  EKG  EKG Interpretation None       Radiology Ct Head Wo Contrast  Result Date: 07/10/2017 CLINICAL DATA:  History of trauma to the back of the head 4 days ago with subsequent headache. EXAM: CT HEAD WITHOUT CONTRAST TECHNIQUE: Contiguous axial images were obtained from the base of the skull through the vertex without intravenous contrast. COMPARISON:  07/02/2013 FINDINGS:  Brain: No evidence of acute infarction, hydrocephalus, extra-axial collection or mass lesion/mass effect. Diffusely increased attenuation of the subarachnoid spaces of the cerebellum. Vascular: No hyperdense vessel or unexpected calcification. Skull: Normal. Negative for fracture or focal lesion. Sinuses/Orbits: No acute finding. Other: None. IMPRESSION: Diffusely increased attenuation of the subarachnoid spaces of the cerebellum. This may be artifactual as this part of the brain is prone to streak artifacts, however subarachnoid hemorrhage cannot be excluded in the settings of trauma. If the patient continues to be symptomatic MRI of the brain may be considered. Electronically Signed   By: Ted Mcalpineobrinka  Dimitrova M.D.   On: 07/10/2017 18:16    Procedures Procedures (including critical care time)  Medications Ordered in ED Medications  HYDROcodone-acetaminophen (NORCO/VICODIN) 5-325 MG per tablet 2 tablet (not administered)     Initial Impression / Assessment and Plan / ED Course  I have reviewed the triage vital signs and the nursing notes.  Pertinent labs & imaging results that were available during  my care of the patient were reviewed by me and considered in my medical decision making (see chart for details).     CT scan shows a questionable subarachnoid hemorrhage cyst cerebellum although this could be artifact. I discussed this with Dr. Franky Machoabbell with neurosurgery. He felt that given that it was 5 days out, it was less likely to be true subarachnoid hemorrhage. And even if it was a small amount subarachnoid hemorrhage, it was unlikely to worsen at this point. Patient was given a prescription for Vicodin to manage pain. He was given head injury precautions and concussion instructions. He was advised not to do any physical exertion for 2 weeks and not to do anything that causes grunting for 2 weeks as well. He was advised to follow-up with Dr. Franky Machoabbell. Return precautions were given.  Final Clinical  Impressions(s) / ED Diagnoses   Final diagnoses:  Concussion without loss of consciousness, initial encounter    New Prescriptions New Prescriptions   HYDROCODONE-ACETAMINOPHEN (NORCO/VICODIN) 5-325 MG TABLET    Take 1-2 tablets by mouth every 4 (four) hours as needed.  I personally performed the services described in this documentation, which was scribed in my presence.  The recorded information has been reviewed and considered.    Rolan BuccoBelfi, Isay Perleberg, MD 07/10/17 [id number]

## 2017-07-10 NOTE — ED Notes (Signed)
ED provider at bedside.

## 2017-07-18 ENCOUNTER — Emergency Department (HOSPITAL_BASED_OUTPATIENT_CLINIC_OR_DEPARTMENT_OTHER)
Admission: EM | Admit: 2017-07-18 | Discharge: 2017-07-18 | Disposition: A | Discharge status: Home / Self Care | Payer: BLUE CROSS/BLUE SHIELD | Attending: Emergency Medicine | Admitting: Emergency Medicine

## 2017-07-18 ENCOUNTER — Encounter (HOSPITAL_BASED_OUTPATIENT_CLINIC_OR_DEPARTMENT_OTHER): Payer: Self-pay | Admitting: Emergency Medicine

## 2017-07-18 DIAGNOSIS — H1045 Other chronic allergic conjunctivitis: Secondary | ICD-10-CM | POA: Diagnosis not present

## 2017-07-18 DIAGNOSIS — J45909 Unspecified asthma, uncomplicated: Secondary | ICD-10-CM | POA: Diagnosis not present

## 2017-07-18 DIAGNOSIS — I10 Essential (primary) hypertension: Secondary | ICD-10-CM | POA: Diagnosis not present

## 2017-07-18 DIAGNOSIS — H11433 Conjunctival hyperemia, bilateral: Secondary | ICD-10-CM | POA: Diagnosis present

## 2017-07-18 DIAGNOSIS — H1013 Acute atopic conjunctivitis, bilateral: Secondary | ICD-10-CM

## 2017-07-18 NOTE — Discharge Instructions (Signed)
You were seen in the ED today with eye redness, itching, and watering. Use artifical tears and benadryl as needed.

## 2017-07-18 NOTE — ED Triage Notes (Addendum)
Requesting to be examined for pink eye due to sleeping in same bed as daughter who has been exposed to other children with pink eye.  Reports eye drainage.  None visualized at present.

## 2017-07-18 NOTE — ED Provider Notes (Signed)
Emergency Department Provider Note   I have reviewed the triage vital signs and the nursing notes.   HISTORY  Chief Complaint Eye Problem   HPI Scott Patterson is a 31 y.o. male presents to the emergency department for evaluation of eye itching, redness, mild drainage. Symptoms have been ongoing for the last 24 hours. His daughter came home with itchy, red eyes and he feels like he is developing since. He tried using artificial tears last night with some relief. No sore throat, cough, sneezing. No fevers or chills. Her vision changes. No radiation of symptoms.  Past Medical History:  Diagnosis Date  . Anxiety   . Asthma   . Depression   . Hypertension     Patient Active Problem List   Diagnosis Date Noted  . Increased urinary frequency 01/20/2017  . Healthcare maintenance 01/20/2017  . Polyuria 01/26/2016  . Depression 01/11/2014  . Genital herpes 04/02/2011  . ESSENTIAL HYPERTENSION, BENIGN 12/11/2010  . OBESITY, NOS 02/09/2007  . Allergic rhinitis 02/09/2007  . Asthma, moderate persistent 02/09/2007    Past Surgical History:  Procedure Laterality Date  . None      Current Outpatient Rx  . Order #: [id number] Class: Normal  . Order #: [id number] Class: Normal  . Order #: [id number] Class: Print  . Order #: [id number] Class: Historical Med  . Order #: [id number] Class: Normal  . Order #: [id number] Class: Print  . Order #: [id number] Class: Print  . Order #: [id number] Class: Normal  . Order #: [id number] Class: Print    Allergies Patient has no known allergies.  Family History  Problem Relation Age of Onset  . Depression Mother   . Hypertension Mother   . Alcohol abuse Father   . Drug abuse Father   . Stroke Father   . Hypertension Father   . Alcohol abuse Paternal Uncle   . Hypertension Brother     Social History Social History  Substance Use Topics  . Smoking status: Never Smoker  . Smokeless tobacco: Never Used  . Alcohol use No    Review of  Systems  Constitutional: No fever/chills Eyes: No visual changes. Positive eye itching and discharge.  ENT: No sore throat. Cardiovascular: Denies chest pain. Respiratory: Denies shortness of breath.  10-point ROS otherwise negative.  ____________________________________________   PHYSICAL EXAM:  VITAL SIGNS: ED Triage Vitals  Enc Vitals Group     BP 07/18/17 0736 133/89     Pulse --      Resp 07/18/17 0736 14     Temp 07/18/17 0736 98.2 F (36.8 C)     Temp Source 07/18/17 0736 Oral     SpO2 07/18/17 0736 100 %     Weight 07/18/17 0736 240 lb (108.9 kg)     Height 07/18/17 0736 6\' 2"  (1.88 m)     Pain Score 07/18/17 0747 6   Constitutional: Alert and oriented. Well appearing and in no acute distress. Eyes: Conjunctivae are normal. No drainage noted. EOMI. PERRL Head: Atraumatic. Nose: No congestion/rhinnorhea. Mouth/Throat: Mucous membranes are moist.   Neck: No stridor.   Cardiovascular: Normal rate, regular rhythm. Good peripheral circulation. Grossly normal heart sounds.   Respiratory: Normal respiratory effort.  No retractions. Lungs CTAB. Musculoskeletal: No lower extremity tenderness nor edema. No gross deformities of extremities. Neurologic:  Normal speech and language. No gross focal neurologic deficits are appreciated.  Skin:  Skin is warm, dry and intact. No rash noted.  ____________________________________________   PROCEDURES  Procedure(s) performed:   Procedures  None ____________________________________________  INITIAL IMPRESSION / ASSESSMENT AND PLAN / ED COURSE  Pertinent labs & imaging results that were available during my care of the patient were reviewed by me and considered in my medical decision making (see chart for details).  Patient presents to the emergency department for evaluation of eye redness with mild drainage and itching. Several other family members are presenting with similar presentation. No fevers or chills. No URI  symptoms. Lower suspicion for bacterial conjunctivitis. Advised moisturizing eyedrops and Benadryl as needed for itching.  At this time, I do not feel there is any life-threatening condition present. I have reviewed and discussed all results (EKG, imaging, lab, urine as appropriate), exam findings with patient. I have reviewed nursing notes and appropriate previous records.  I feel the patient is safe to be discharged home without further emergent workup. Discussed usual and customary return precautions. Patient and family (if present) verbalize understanding and are comfortable with this plan.  Patient will follow-up with their primary care provider. If they do not have a primary care provider, information for follow-up has been provided to them. All questions have been answered.  ____________________________________________  FINAL CLINICAL IMPRESSION(S) / ED DIAGNOSES  Final diagnoses:  Allergic conjunctivitis of both eyes     MEDICATIONS GIVEN DURING THIS VISIT:  Medications - No data to display   NEW OUTPATIENT MEDICATIONS STARTED DURING THIS VISIT:  None   Note:  This document was prepared using Dragon voice recognition software and may include unintentional dictation errors.  Alona Bene, MD Emergency Medicine    Long, Arlyss Repress, MD 07/18/17 [id number]

## 2017-08-03 ENCOUNTER — Encounter (HOSPITAL_COMMUNITY): Payer: Self-pay

## 2017-08-03 ENCOUNTER — Emergency Department (HOSPITAL_COMMUNITY): Payer: BLUE CROSS/BLUE SHIELD

## 2017-08-03 ENCOUNTER — Emergency Department (HOSPITAL_COMMUNITY)
Admission: EM | Admit: 2017-08-03 | Discharge: 2017-08-03 | Disposition: A | Discharge status: Home / Self Care | Payer: BLUE CROSS/BLUE SHIELD | Attending: Emergency Medicine | Admitting: Emergency Medicine

## 2017-08-03 DIAGNOSIS — J45909 Unspecified asthma, uncomplicated: Secondary | ICD-10-CM | POA: Diagnosis not present

## 2017-08-03 DIAGNOSIS — G8929 Other chronic pain: Secondary | ICD-10-CM

## 2017-08-03 DIAGNOSIS — I10 Essential (primary) hypertension: Secondary | ICD-10-CM | POA: Diagnosis not present

## 2017-08-03 DIAGNOSIS — R531 Weakness: Secondary | ICD-10-CM | POA: Diagnosis not present

## 2017-08-03 DIAGNOSIS — R079 Chest pain, unspecified: Secondary | ICD-10-CM

## 2017-08-03 DIAGNOSIS — R42 Dizziness and giddiness: Secondary | ICD-10-CM | POA: Diagnosis not present

## 2017-08-03 DIAGNOSIS — R51 Headache: Secondary | ICD-10-CM | POA: Insufficient documentation

## 2017-08-03 LAB — CBC
HCT: 43.6 % (ref 39.0–52.0)
Hemoglobin: 14.4 g/dL (ref 13.0–17.0)
MCH: 25.9 pg — AB (ref 26.0–34.0)
MCHC: 33 g/dL (ref 30.0–36.0)
MCV: 78.4 fL (ref 78.0–100.0)
PLATELETS: 150 10*3/uL (ref 150–400)
RBC: 5.56 MIL/uL (ref 4.22–5.81)
RDW: 14.8 % (ref 11.5–15.5)
WBC: 5.1 10*3/uL (ref 4.0–10.5)

## 2017-08-03 LAB — BASIC METABOLIC PANEL
Anion gap: 6 (ref 5–15)
BUN: 16 mg/dL (ref 6–20)
CALCIUM: 9.1 mg/dL (ref 8.9–10.3)
CHLORIDE: 106 mmol/L (ref 101–111)
CO2: 28 mmol/L (ref 22–32)
CREATININE: 1.22 mg/dL (ref 0.61–1.24)
GFR calc Af Amer: >60 mL/min (ref >60)
GFR calc non Af Amer: >60 mL/min (ref >60)
Glucose, Bld: 98 mg/dL (ref 65–99)
Potassium: 4.1 mmol/L (ref 3.5–5.1)
SODIUM: 140 mmol/L (ref 135–145)

## 2017-08-03 LAB — I-STAT TROPONIN, ED: TROPONIN I, POC: 0 ng/mL (ref 0.00–0.08)

## 2017-08-03 MED ORDER — KETOROLAC TROMETHAMINE 15 MG/ML IJ SOLN
15.0000 mg | Freq: Once | INTRAMUSCULAR | Status: AC
  Administered 2017-08-03: 15 mg via INTRAVENOUS
  Filled 2017-08-03: qty 1

## 2017-08-03 NOTE — Discharge Instructions (Signed)
You were treated today for chest discomfort and headache following your concussion. We would like you to follow up with Dr. Antoine Primas, D.O. 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 Ear examination and testing today was reassuring that there is not a cardiac, pulmonary, neurological cause for your symptoms. Please return for evaluation if you have worsening chest pain, shortness of breath, passing out, nausea and vomiting, changes in her mental status, confusion, weakness, worsening headache.  Your blood pressure was slightly elevated at this visit. Please follow-up with your primary care provider for this.  For pain, please take 500 mg of Naproxen every 12 hours as needed and Tylenol 650 mg every 6 hours as needed.   If still requiring Naproxen around the clock for acute pain after 10 days, please see your primary healthcare provider.  This is not a long-term medication unless under the care and direction of your primary provider. Taking this medication long-term and not under the supervision of a healthcare provider could increase the risk of stomach ulcers, kidney problems, and cardiovascular problems such as high blood pressure.   Thank you for allowing [place] to participate in your care today.  Concussion  What is a concussion? -- A concussion is a mild brain injury that can cause confusion, memory loss, and headache. Sometimes people pass out (lose consciousness) when they have a concussion, but not always.  A concussion can happen after a person has an injury to the head from being hit or falling. What are the symptoms of a concussion? -- Symptoms that can happen minutes to hours after a concussion include: ?Memory loss - People sometimes forget what caused their injury, as well as what happened right before and after the injury. ?Confusion ?Headache ?Dizziness or trouble with balance ?Nausea or vomiting ?Feeling sleepy ?Acting cranky, strangely, or out  of sorts Symptoms that can happen hours to days after a concussion include: ?Trouble walking or talking ?Memory problems or problems paying attention ?Trouble sleeping ?Mood or behavior changes ?Vision changes ?Being bothered by noise or light  Will I need tests? -- It depends on your injury and symptoms. To check if you have a concussion, your doctor will ask about your symptoms and do an exam. He or she will also ask you questions to check that you are thinking clearly. If your doctor suspects a serious injury, he or she might order an imaging test of the brain, such as a CT or MRI scan. These tests create pictures of the skull and inside of the brain. How is a concussion treated? -- A concussion does not usually need treatment. Most concussions get better on their own, but it can take time. Some people's symptoms go away within minutes to hours. Other people have symptoms for weeks to months. When symptoms last a long time, doctors call it "postconcussion syndrome."  After your concussion, your doctor might recommend that someone watch you for 12 to 24 hours. This person should watch for symptoms. To help your brain heal after a concussion, you can: ?Rest your body - Make sure to get plenty of sleep. Avoid heavy exercise or too much physical activity if it makes you feel worse. ?Rest your brain - Avoid doing activities that need concentration or a lot of attention if they make you feel worse. ?Not drink alcohol while you are still having symptoms of concussion ?Take a pain-relieving medicine, if you have a headache - You can choose one with acetaminophen (sample brand name: Tylenol) or  ibuprofen (sample brand names: Advil, Motrin).  When can I play sports or do my usual activities again? -- Ask your doctor when you can play sports or do your usual activities again. It will depend on your injury and symptoms, as well as the type of sport you play. Do not go back to playing on the same day as  your injury. It's important to let your brain heal completely after a concussion. Getting another concussion before your brain has healed may lead to serious brain problems.   When should I call the doctor or nurse? -- Call the doctor or nurse if any of the following happen after a concussion: ?You vomit more than 3 times ?You have a severe headache, or a headache that gets worse ?You have a seizure ?You have trouble walking or talking ?Your vision changes ?You feel weak or numb in part of your body ?You lose control over your bladder or bowel If your doctor suggested that someone watch you after your concussion, this person should call the doctor or nurse if he or she: ?Can't wake you up ?Sees any of the symptoms listed above How can I prevent another concussion? -- To help prevent another concussion, you can: ?Wear a helmet when you ride a bike or motorcycle, or play certain sports ?Wear a seat belt when you drive or ride in a car If you have one concussion, it's very important to try to prevent future concussions. Having many concussions might cause long-term brain damage and affect your thinking.

## 2017-08-03 NOTE — ED Triage Notes (Signed)
Patient reports a mild concussion one month ago. Today patient c/o chest pain bilaterally, weakness. Patient c/o intermittent headache x 1 month.

## 2017-08-03 NOTE — ED Provider Notes (Signed)
+ [facility]-EMERGENCY DEPT Provider Note   CSN: [id number] Arrival date & time: 08/03/17  1426     History   Chief Complaint Chief Complaint  Patient presents with  . Chest Pain  . Weakness  . Headache    HPI Scott Patterson is a 31 y.o. male.  HPI Patient is a 31 y.o. male with a past history of hypertension, asthma, and a concussion 1 month ago presenting for bilateral temporal headache, dizziness, and a bilateral chest "tense" feeling. Patient's concussion resulted from a barbell carrying approx. 20 lbs falling on the crown of his head. CT 5 days after incident revealed possible subarachnoid attenuation in the cerebellum possibly representing subarachnoid hemorrhage, but no neurosurgical intervention recommended at that time. Patient reports that he has had a daily headache from this incident with decreased concentration and dizziness mid-day but the headache is worse today, approximately 9/10. Headache is in a bandlike pattern. Patient reports an episode of feeling lightheaded to the point of needing to sit down while experiencing this headache today. Patient reports he typically takes Advil/Tylenol, however this is not relieving his pain. Patient reports feeling weaker and "dehydrated". Patient reports some blurry vision, decreased concentration with vision, but denies any syncopal episodes, diplopia, photophobia, difficulty speaking, vertigo, amnesia, nausea, vomiting. Patient denies any unilateral extremity weakness. Patient reports that the chest "tense" feeling began this morning as he was working in his office. The feeling is tight throughout his bilateral chest and up through bilateral neck muscles, and patient reports that the pain is about a 6 out of 10. The pain does not change with position. Patient has not tried any remedies for this pain. Patient has no personal or family history of coronary disease. Patient has not had any cardiac procedures. Patient is a nonsmoker and denies any  illicit drug use.  Past Medical History:  Diagnosis Date  . Anxiety   . Asthma   . Depression   . Hypertension     Patient Active Problem List   Diagnosis Date Noted  . Increased urinary frequency 01/20/2017  . Healthcare maintenance 01/20/2017  . Polyuria 01/26/2016  . Depression 01/11/2014  . Genital herpes 04/02/2011  . ESSENTIAL HYPERTENSION, BENIGN 12/11/2010  . OBESITY, NOS 02/09/2007  . Allergic rhinitis 02/09/2007  . Asthma, moderate persistent 02/09/2007    Past Surgical History:  Procedure Laterality Date  . None         Home Medications    Prior to Admission medications   Medication Sig Start Date End Date Taking? Authorizing Provider  albuterol (PROVENTIL HFA;VENTOLIN HFA) 108 (90 Base) MCG/ACT inhaler Inhale 2 puffs into the lungs every 6 (six) hours as needed for wheezing or shortness of breath. 01/20/17  Yes Haney, Narvel Kozub A, MD  hydrochlorothiazide (HYDRODIURIL) 12.5 MG tablet Take 1 tablet (12.5 mg total) by mouth daily. 01/20/17  Yes Haney, Jovin Fester A, MD  ibuprofen (ADVIL,MOTRIN) 200 MG tablet Take 600-800 mg by mouth every 6 (six) hours as needed for mild pain.   Yes [provider]  cetirizine (ZYRTEC) 10 MG tablet Take 1 tablet (10 mg total) by mouth daily. Patient not taking: Reported on 08/03/2017 01/20/17   Bonney Aid, MD  HYDROcodone-acetaminophen (NORCO/VICODIN) 5-325 MG tablet Take 1-2 tablets by mouth every 4 (four) hours as needed. Patient not taking: Reported on 08/03/2017 07/10/17   Rolan Bucco, MD  mometasone (NASONEX) 50 MCG/ACT nasal spray Place 2 sprays into the nose daily. Patient not taking: Reported on 08/03/2017 01/20/17   Kennon Rounds,  Jeanann Lewandowsky, MD    Family History Family History  Problem Relation Age of Onset  . Depression Mother   . Hypertension Mother   . Alcohol abuse Father   . Drug abuse Father   . Stroke Father   . Hypertension Father   . Alcohol abuse Paternal Uncle   . Hypertension Brother     Social  History Social History  Substance Use Topics  . Smoking status: Never Smoker  . Smokeless tobacco: Never Used  . Alcohol use No     Allergies   Patient has no known allergies.   Review of Systems Review of Systems  Constitutional: Positive for activity change. Negative for chills, diaphoresis, fatigue and fever.  HENT: Negative for congestion, rhinorrhea, sinus pain, sore throat and voice change.   Eyes: Negative for photophobia and visual disturbance.  Respiratory: Positive for chest tightness. Negative for cough, shortness of breath and wheezing.   Cardiovascular: Positive for chest pain. Negative for palpitations and leg swelling.  Gastrointestinal: Negative for abdominal pain, blood in stool, diarrhea, nausea and vomiting.  Genitourinary: Negative for difficulty urinating, dysuria, flank pain and urgency.  Musculoskeletal: Negative for arthralgias, back pain and myalgias.  Skin: Negative for color change and rash.  Neurological: Positive for dizziness, weakness, light-headedness and headaches. Negative for syncope and numbness.  Psychiatric/Behavioral: Positive for decreased concentration. Negative for confusion.     Physical Exam Updated Vital Signs BP 135/87 (BP Location: Left Arm)   Pulse 78   Temp 98 F (36.7 C) (Oral)   Resp 20   Ht 6\' 2"  (1.88 m)   Wt 108.9 kg (240 lb)   SpO2 99%   BMI 30.81 kg/m   Physical Exam  Constitutional: He is oriented to person, place, and time. He appears well-developed and well-nourished. No distress.  HENT:  Head: Normocephalic and atraumatic.  Mouth/Throat: Oropharynx is clear and moist.  Eyes: Pupils are equal, round, and reactive to light. Conjunctivae and EOM are normal.  Neck: Normal range of motion. Neck supple.  Cardiovascular: Normal rate, regular rhythm, S1 normal and S2 normal.   No murmur heard. Pulmonary/Chest: Effort normal and breath sounds normal. He has no wheezes. He has no rales.  Abdominal: Soft. He exhibits  no distension. There is no tenderness. There is no guarding.  Musculoskeletal: Normal range of motion. He exhibits no edema or deformity.  Lymphadenopathy:    He has no cervical adenopathy.  Neurological: He is alert and oriented to person, place, and time.  Mental Status:  Alert, oriented, thought content appropriate, able to give a coherent history. Speech fluent without evidence of aphasia. Able to follow 2 step commands without difficulty.  Cranial Nerves:  II:  Peripheral visual fields grossly normal, pupils equal, round, reactive to light III,IV, VI: ptosis not present, extra-ocular motions intact bilaterally  V,VII: smile symmetric, facial light touch sensation equal VIII: hearing grossly normal to voice  X: uvula elevates symmetrically  XI: bilateral shoulder shrug symmetric and strong XII: midline tongue extension without fassiculations Motor:  Normal tone. 5/5 in upper and lower extremities bilaterally including strong and equal grip strength and dorsiflexion/plantar flexion Sensory: Pinprick and light touch normal in distal upper extremities.  Cerebellar: normal finger-to-nose with bilateral upper extremities, intact heel to shin b/l. Gait: normal gait and balance CV: distal pulses palpable throughout   Skin: Skin is warm and dry. No rash noted. No erythema.  Psychiatric: He has a normal mood and affect. His behavior is normal. Judgment and thought content  normal.  Nursing note and vitals reviewed.    ED Treatments / Results  Labs (all labs ordered are listed, but only abnormal results are displayed) Labs Reviewed  CBC - Abnormal; Notable for the following:       Result Value   MCH 25.9 (*)    All other components within normal limits  BASIC METABOLIC PANEL  I-STAT TROPONIN, ED    EKG  EKG Interpretation  Date/Time:  Wednesday August 03 2017 14:42:46 EDT Ventricular Rate:  90 PR Interval:    QRS Duration: 73 QT Interval:  327 QTC Calculation: 400 R  Axis:   72 Text Interpretation:  Sinus rhythm No acute changes No significant change since last tracing Confirmed by Derwood Kaplan ([id number]) on 08/03/2017 3:56:42 PM       Radiology Dg Chest 2 View  Result Date: 08/03/2017 CLINICAL DATA:  Chest pain and dizziness EXAM: CHEST  2 VIEW COMPARISON:  July 02, 2013 FINDINGS: Lungs are clear. Heart size and pulmonary vascularity are normal. No adenopathy. A safety pin overlies the left lower neck region. No pneumothorax. No bone lesions. IMPRESSION: No edema or consolidation. Electronically Signed   By: Bretta Bang III M.D.   On: 08/03/2017 15:14    Procedures Procedures (including critical care time)  Medications Ordered in ED Medications  ketorolac (TORADOL) 15 MG/ML injection 15 mg (15 mg Intravenous Given 08/03/17 1716)     Initial Impression / Assessment and Plan / ED Course  I have reviewed the triage vital signs and the nursing notes.  Pertinent labs & imaging results that were available during my care of the patient were reviewed by me and considered in my medical decision making (see chart for details).  Clinical Course as of Aug 03 1902  Wed Aug 03, 2017  1516 Elevated blood pressure noted. Patient informed. Patient did not take antihypertensive today.  [AM]  1725 Patient seen and re-evaluated. Patient reports pain 4-5/10. Toradol given. Will re-evaluated.  [AM]  1903 Patient reevaluated. Patient reports that his pain is resolved with Toradol. Patient in understanding following instructions and return precautions.  [AM]    Clinical Course User Index [AM] Elisha Ponder, PA-C     Final Clinical Impressions(s) / ED Diagnoses   Final diagnoses:  None   MDM New Prescriptions New Prescriptions   No medications on file   MDM  31 year old male presenting for acute on chronic headache that acutely worsened today post concussion approximately one month ago with associated dizziness, decreased concentration, possible  presyncope. Differential diagnosis for headache includes concussion, tension headache, chronic subarachnoid hemorrhage, vertebral artery dissection. Patient is not having any acute vertiginous symptoms and is  neurologically intact as evidenced by full neurological examination. In addition, patient reports that there is no history of hyperextension of the head during this injury. Therefore, doubt vertebral artery dissection or acute bleeding as potential cause of patient's acutely worsening headache. I suspect that this is a post concussion syndrome and patient needs to return to concussion protocol.   For chest pain, doubt ACS due to EKG normal sinus rhythm with no evidence of ischemia, infarction, arrhythmia, and a normal troponin today. Patient has a HEART score of 1. PERC score 0, making pulmonary embolism unlikely. Patient has no obvious musculoskeletal cause for pain such as lifting heavy objects bending or straining however patient is experiencing pain bilaterally pectoral regions radiating to the neck. Pain was resolved upon leaving the ED. Patient reports he has been anxious about his symptoms as  the concussion symptoms have lingered longer than he had expected.   Patient given instructions for strict return precautions for both headache and chest pain. Patient agrees to follow-up with concussion specialist and is given instructions to take naproxen and Tylenol for pain. Patient is in agreement with plan of care. Patient's blood pressure to be elevated on a couple readings throughout this visit. Patient did not take antihypertensive today. Patient informed of this reading and instructed to follow up with PCP.  This is a shared patient visit with M.D. Patient's case discussed throughout course with Dr. Derwood Kaplan regarding high risk neurological and cardiopulmonary complaints. Patient independent evaluated by Dr. Rhunette Croft who verified safe discharge, suggested pain control regimen and specialist  follow-up.    Elisha Ponder, PA-C 08/05/17 [id number]    Derwood Kaplan, MD 08/09/17 [id number]

## 2017-09-29 ENCOUNTER — Ambulatory Visit: Payer: BLUE CROSS/BLUE SHIELD | Admitting: Family Medicine

## 2017-11-18 ENCOUNTER — Encounter (HOSPITAL_COMMUNITY): Payer: Self-pay

## 2017-11-18 ENCOUNTER — Other Ambulatory Visit: Payer: Self-pay

## 2017-11-18 ENCOUNTER — Emergency Department (HOSPITAL_COMMUNITY)
Admission: EM | Admit: 2017-11-18 | Discharge: 2017-11-19 | Disposition: A | Discharge status: Home / Self Care | Payer: BLUE CROSS/BLUE SHIELD | Attending: Emergency Medicine | Admitting: Emergency Medicine

## 2017-11-18 ENCOUNTER — Emergency Department (HOSPITAL_COMMUNITY): Payer: BLUE CROSS/BLUE SHIELD

## 2017-11-18 DIAGNOSIS — Z79899 Other long term (current) drug therapy: Secondary | ICD-10-CM | POA: Insufficient documentation

## 2017-11-18 DIAGNOSIS — I10 Essential (primary) hypertension: Secondary | ICD-10-CM | POA: Insufficient documentation

## 2017-11-18 DIAGNOSIS — M549 Dorsalgia, unspecified: Secondary | ICD-10-CM | POA: Insufficient documentation

## 2017-11-18 DIAGNOSIS — J45909 Unspecified asthma, uncomplicated: Secondary | ICD-10-CM | POA: Insufficient documentation

## 2017-11-18 DIAGNOSIS — M542 Cervicalgia: Secondary | ICD-10-CM | POA: Diagnosis not present

## 2017-11-18 MED ORDER — IBUPROFEN 800 MG PO TABS
800.0000 mg | ORAL_TABLET | Freq: Once | ORAL | Status: AC
  Administered 2017-11-18: 800 mg via ORAL
  Filled 2017-11-18: qty 1

## 2017-11-18 MED ORDER — METHOCARBAMOL 500 MG PO TABS: 500.0000 mg | ORAL_TABLET | Freq: Three times a day (TID) | ORAL | 0 refills | Status: DC | PRN
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
## 2017-11-18 NOTE — Discharge Instructions (Addendum)
Please f/u with your PCP as needed. Take Rx as needed for pain. Do not work or drive a motor vehicle after taking this medication. Return to the ED if you experience any worsening neck pain, worsening back pain, or any numbness or tingling to the lower extremities.   To find a primary care or specialty doctor please call [phone] or [phone] to access "[place] Find a Doctor Service."  You may also go on the [facility] website at [email or web address]  There are also multiple [facility], [facility], [facility] and [facility] practices throughout [place] that are frequently accepting new patients. You may find a clinic that is close to your home and contact them.  [facility] -  [address] [place] [phone]   [facility] -  [address] [place] [id number] [phone]   [facility] [phone]  [place] [phone]

## 2017-11-25 ENCOUNTER — Other Ambulatory Visit: Payer: Self-pay

## 2017-11-25 ENCOUNTER — Emergency Department (HOSPITAL_BASED_OUTPATIENT_CLINIC_OR_DEPARTMENT_OTHER)
Admission: EM | Admit: 2017-11-25 | Discharge: 2017-11-25 | Disposition: A | Discharge status: Home / Self Care | Payer: BLUE CROSS/BLUE SHIELD | Attending: Emergency Medicine | Admitting: Emergency Medicine

## 2017-11-25 ENCOUNTER — Encounter (HOSPITAL_BASED_OUTPATIENT_CLINIC_OR_DEPARTMENT_OTHER): Payer: Self-pay | Admitting: Emergency Medicine

## 2017-11-25 ENCOUNTER — Emergency Department (HOSPITAL_BASED_OUTPATIENT_CLINIC_OR_DEPARTMENT_OTHER): Payer: BLUE CROSS/BLUE SHIELD

## 2017-11-25 DIAGNOSIS — Y998 Other external cause status: Secondary | ICD-10-CM | POA: Insufficient documentation

## 2017-11-25 DIAGNOSIS — Z79899 Other long term (current) drug therapy: Secondary | ICD-10-CM | POA: Diagnosis not present

## 2017-11-25 DIAGNOSIS — S39012D Strain of muscle, fascia and tendon of lower back, subsequent encounter: Secondary | ICD-10-CM

## 2017-11-25 DIAGNOSIS — I10 Essential (primary) hypertension: Secondary | ICD-10-CM | POA: Insufficient documentation

## 2017-11-25 DIAGNOSIS — S29012D Strain of muscle and tendon of back wall of thorax, subsequent encounter: Secondary | ICD-10-CM | POA: Insufficient documentation

## 2017-11-25 DIAGNOSIS — J45909 Unspecified asthma, uncomplicated: Secondary | ICD-10-CM | POA: Diagnosis not present

## 2017-11-25 DIAGNOSIS — S299XXD Unspecified injury of thorax, subsequent encounter: Secondary | ICD-10-CM | POA: Diagnosis present

## 2017-11-25 DIAGNOSIS — Y92410 Unspecified street and highway as the place of occurrence of the external cause: Secondary | ICD-10-CM | POA: Diagnosis not present

## 2017-11-25 DIAGNOSIS — Y939 Activity, unspecified: Secondary | ICD-10-CM | POA: Insufficient documentation

## 2017-11-25 MED ORDER — CYCLOBENZAPRINE HCL 5 MG PO TABS: 5.0000 mg | ORAL_TABLET | Freq: Three times a day (TID) | ORAL | 0 refills | Status: DC | PRN

## 2017-11-25 MED ORDER — TRAMADOL HCL 50 MG PO TABS: 50.0000 mg | ORAL_TABLET | Freq: Four times a day (QID) | ORAL | 0 refills | Status: DC | PRN

## 2017-11-25 MED ORDER — DICLOFENAC SODIUM 1 % TD GEL: 4.0000 g | Freq: Four times a day (QID) | TRANSDERMAL | 0 refills | Status: DC
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
## 2017-11-25 NOTE — Discharge Instructions (Signed)
Take motrin for pain.   Take flexeril instead of robaxin for muscle spasms.   Take tramadol for severe pain.   Apply voltaren gel to the back   See your doctor  Return to ER if you have worse back pain, weakness, numbness
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
## 2017-11-27 ENCOUNTER — Other Ambulatory Visit: Payer: Self-pay

## 2017-11-27 ENCOUNTER — Encounter (HOSPITAL_BASED_OUTPATIENT_CLINIC_OR_DEPARTMENT_OTHER): Payer: Self-pay | Admitting: Emergency Medicine

## 2017-11-27 ENCOUNTER — Emergency Department (HOSPITAL_BASED_OUTPATIENT_CLINIC_OR_DEPARTMENT_OTHER)
Admission: EM | Admit: 2017-11-27 | Discharge: 2017-11-27 | Disposition: A | Discharge status: Home / Self Care | Payer: BLUE CROSS/BLUE SHIELD | Attending: Physician Assistant | Admitting: Physician Assistant

## 2017-11-27 DIAGNOSIS — J45909 Unspecified asthma, uncomplicated: Secondary | ICD-10-CM | POA: Insufficient documentation

## 2017-11-27 DIAGNOSIS — J029 Acute pharyngitis, unspecified: Secondary | ICD-10-CM

## 2017-11-27 DIAGNOSIS — F419 Anxiety disorder, unspecified: Secondary | ICD-10-CM | POA: Insufficient documentation

## 2017-11-27 DIAGNOSIS — I10 Essential (primary) hypertension: Secondary | ICD-10-CM | POA: Insufficient documentation

## 2017-11-27 DIAGNOSIS — Z79899 Other long term (current) drug therapy: Secondary | ICD-10-CM | POA: Insufficient documentation

## 2017-11-27 DIAGNOSIS — F329 Major depressive disorder, single episode, unspecified: Secondary | ICD-10-CM | POA: Diagnosis not present

## 2017-11-27 NOTE — ED Triage Notes (Signed)
Pt c/o cough and sore throat x few days. Pt in household with others that have same symptoms.

## 2017-11-27 NOTE — Discharge Instructions (Signed)
Continue to stay well-hydrated. Gargle warm salt water and spit it out for sore throat. May also use cough drops, warm teas, etc. Take flonase to decrease nasal congestion. Zyrtec for nasal congestion and scratchy throat. Alternate 600 mg of ibuprofen and [phone] mg of Tylenol every 3 hours as needed for pain. Do not exceed 4000 mg of Tylenol daily.   Followup with your primary care doctor in 5-7 days for recheck of ongoing symptoms. Return to emergency department for emergent changing or worsening of symptoms such as throat tightness, facial swelling, fever not controlled by ibuprofen or Tylenol,difficulty breathing, or chest pain.

## 2017-11-27 NOTE — ED Notes (Signed)
ED Provider at bedside. 

## 2017-11-27 NOTE — ED Provider Notes (Signed)
[facility] EMERGENCY DEPARTMENT Provider Note   CSN: [id number] Arrival date & time: 11/27/17  1631     History   Chief Complaint Chief Complaint  Patient presents with  . Cough  . Sore Throat    HPI Scott Patterson is a 31 y.o. male   With history of asthma and hypertension presents today with chief complaint acute onset, constant sore throat which began earlier today.  He states that he had been spending time with his daughter since Friday who were having similar is mild nasal congestion and nonproductive cough.  Denies fevers, chills, chest pain, shortness of breath, abdominal pain, nausea, or vomiting.  No throat tightness, drooling, or facial swelling.  Has tried over-the-counter medication with some relief.  Pain does not radiate.  He denies headache or neck pain.  He is a non-smoker.  The history is provided by the patient.    Past Medical History:  Diagnosis Date  . Anxiety   . Asthma   . Depression   . Hypertension     Patient Active Problem List   Diagnosis Date Noted  . Increased urinary frequency 01/20/2017  . Healthcare maintenance 01/20/2017  . Polyuria 01/26/2016  . Depression 01/11/2014  . Genital herpes 04/02/2011  . ESSENTIAL HYPERTENSION, BENIGN 12/11/2010  . OBESITY, NOS 02/09/2007  . Allergic rhinitis 02/09/2007  . Asthma, moderate persistent 02/09/2007    Past Surgical History:  Procedure Laterality Date  . None         Home Medications    Prior to Admission medications   Medication Sig Start Date End Date Taking? Authorizing Provider  albuterol (PROVENTIL HFA;VENTOLIN HFA) 108 (90 Base) MCG/ACT inhaler Inhale 2 puffs into the lungs every 6 (six) hours as needed for wheezing or shortness of breath. 01/20/17  Yes Haney, Alyssa A, MD  cetirizine (ZYRTEC) 10 MG tablet Take 1 tablet (10 mg total) by mouth daily. 01/20/17  Yes Haney, Alyssa A, MD  cyclobenzaprine (FLEXERIL) 5 MG tablet Take 1 tablet (5 mg total) by mouth 3 (three) times  daily as needed for muscle spasms. 11/25/17  Yes Charlynne Pander, MD  hydrochlorothiazide (HYDRODIURIL) 12.5 MG tablet Take 1 tablet (12.5 mg total) by mouth daily. 01/20/17  Yes Haney, Alyssa A, MD  ibuprofen (ADVIL,MOTRIN) 200 MG tablet Take 600-800 mg by mouth every 6 (six) hours as needed for mild pain.   Yes [provider]  mometasone (NASONEX) 50 MCG/ACT nasal spray Place 2 sprays into the nose daily. 01/20/17  Yes Haney, Alyssa A, MD  diclofenac sodium (VOLTAREN) 1 % GEL Apply 4 g topically 4 (four) times daily. 11/25/17   Charlynne Pander, MD  HYDROcodone-acetaminophen (NORCO/VICODIN) 5-325 MG tablet Take 1-2 tablets by mouth every 4 (four) hours as needed. Patient not taking: Reported on 08/03/2017 07/10/17   Rolan Bucco, MD  methocarbamol (ROBAXIN) 500 MG tablet Take 1 tablet (500 mg total) by mouth every 8 (eight) hours as needed for muscle spasms. 11/18/17   Couture, Cortni S, PA-C  traMADol (ULTRAM) 50 MG tablet Take 1 tablet (50 mg total) by mouth every 6 (six) hours as needed. 11/25/17   Charlynne Pander, MD    Family History Family History  Problem Relation Age of Onset  . Depression Mother   . Hypertension Mother   . Alcohol abuse Father   . Drug abuse Father   . Stroke Father   . Hypertension Father   . Alcohol abuse Paternal Uncle   . Hypertension Brother  Social History Social History   Tobacco Use  . Smoking status: Never Smoker  . Smokeless tobacco: Never Used  Substance Use Topics  . Alcohol use: No  . Drug use: No     Allergies   Patient has no known allergies.   Review of Systems Review of Systems  Constitutional: Negative for chills and fever.  HENT: Positive for congestion and sore throat.   Respiratory: Positive for cough.   Cardiovascular: Negative for chest pain.  All other systems reviewed and are negative.    Physical Exam Updated Vital Signs BP (!) 141/87 (BP Location: Right Arm)   Pulse 75   Temp 98.1 F (36.7 C)  (Oral)   Resp 17   Ht 6\' 2"  (1.88 m)   Wt 108.9 kg (240 lb)   SpO2 99%   BMI 30.81 kg/m   Physical Exam  Constitutional: He appears well-developed and well-nourished. No distress.  HENT:  Head: Normocephalic and atraumatic.  Right Ear: Tympanic membrane and ear canal normal.  Left Ear: Tympanic membrane and ear canal normal.  Mouth/Throat: Uvula is midline and oropharynx is clear and moist. No uvula swelling. No oropharyngeal exudate, posterior oropharyngeal edema, posterior oropharyngeal erythema or tonsillar abscesses. No tonsillar exudate.  No frontal or maxillary sinus TTP.  Posterior oropharynx with mild erythema but no significant tonsillar hypertrophy, exudates, uvular deviation, or sublingual abnormalities.  No trismus.  Nasal septum is midline with mild mucosal edema.  Eyes: Conjunctivae are normal. Right eye exhibits no discharge. Left eye exhibits no discharge.  Neck: No JVD present. No tracheal deviation present.  Cardiovascular: Normal rate.  Pulmonary/Chest: Effort normal.  Abdominal: He exhibits no distension.  Musculoskeletal: He exhibits no edema.  Neurological: He is alert.  Skin: No erythema.  Psychiatric: He has a normal mood and affect. His behavior is normal.  Nursing note and vitals reviewed.    ED Treatments / Results  Labs (all labs ordered are listed, but only abnormal results are displayed) Labs Reviewed - No data to display  EKG  EKG Interpretation None       Radiology No results found.  Procedures Procedures (including critical care time)  Medications Ordered in ED Medications - No data to display   Initial Impression / Assessment and Plan / ED Course  I have reviewed the triage vital signs and the nursing notes.  Pertinent labs & imaging results that were available during my care of the patient were reviewed by me and considered in my medical decision making (see chart for details).     Patient with sore throat and nonproductive  cough for 1 day.  Pt afebrile without tonsillar exudate. Presents with mild dysphagia; diagnosis of viral pharyngitis.  Lungs clear to auscultation and I doubt pneumonia.  Chest x-ray not indicated at this time.  Doubt strep pharyngitis.  No abx indicated. DC w symptomatic tx for pain  Pt does not appear dehydrated, but did discuss importance of water rehydration. Presentation non concerning for PTA or infxn spread to soft tissue. No trismus or uvula deviation. Specific return precautions discussed.  Tolerating secretions without difficulty. Pt able to drink water in ED without difficulty with intact air way. Recommended PCP follow up.  Discussed indications for return to the ED. Pt verbalized understanding of and agreement with plan and is safe for discharge home at this time.   Final Clinical Impressions(s) / ED Diagnoses   Final diagnoses:  Viral pharyngitis    ED Discharge Orders    None  Jeanie SewerFawze, Cannie Muckle A, PA-C 11/28/17 0054    Abelino DerrickMackuen, Courteney Lyn, MD 11/29/17 [phone]

## 2018-01-13 ENCOUNTER — Ambulatory Visit: Payer: BLUE CROSS/BLUE SHIELD | Admitting: Family Medicine

## 2018-02-02 ENCOUNTER — Other Ambulatory Visit: Payer: Self-pay

## 2018-02-02 DIAGNOSIS — I10 Essential (primary) hypertension: Secondary | ICD-10-CM

## 2018-02-06 ENCOUNTER — Other Ambulatory Visit: Payer: Self-pay

## 2018-02-06 ENCOUNTER — Ambulatory Visit (INDEPENDENT_AMBULATORY_CARE_PROVIDER_SITE_OTHER): Payer: BLUE CROSS/BLUE SHIELD | Admitting: Family Medicine

## 2018-02-06 ENCOUNTER — Encounter: Payer: Self-pay | Admitting: Family Medicine

## 2018-02-06 VITALS — BP 140/64 | HR 75 | Temp 98.2°F | Ht 74.0 in | Wt 248.0 lb

## 2018-02-06 DIAGNOSIS — E669 Obesity, unspecified: Secondary | ICD-10-CM

## 2018-02-06 DIAGNOSIS — Z6830 Body mass index (BMI) 30.0-30.9, adult: Secondary | ICD-10-CM | POA: Diagnosis not present

## 2018-02-06 DIAGNOSIS — F419 Anxiety disorder, unspecified: Secondary | ICD-10-CM | POA: Diagnosis not present

## 2018-02-06 DIAGNOSIS — I10 Essential (primary) hypertension: Secondary | ICD-10-CM | POA: Diagnosis not present

## 2018-02-06 MED ORDER — MIRTAZAPINE 15 MG PO TABS: 15.0000 mg | ORAL_TABLET | Freq: Every day | ORAL | 2 refills | Status: DC

## 2018-02-06 MED ORDER — CYCLOBENZAPRINE HCL 5 MG PO TABS: 5.0000 mg | ORAL_TABLET | Freq: Three times a day (TID) | ORAL | 0 refills | Status: DC | PRN

## 2018-02-06 MED ORDER — HYDROCHLOROTHIAZIDE 12.5 MG PO TABS: 12.5000 mg | ORAL_TABLET | Freq: Every day | ORAL | 5 refills | Status: DC

## 2018-02-06 NOTE — Patient Instructions (Signed)
Thank you for coming to see me today. It was a pleasure! Today we talked about:   Your anxiety. I have sent mirtazapine to your pharmacy. Please take one pill daily at bedtime for at least 4-6 weeks. Please follow up with me after that. Also please schedule an appointment with our behavioral health clinic.   Your hypertension. Congratulations on the weight loss! Continue with your lifestyle changes and please continue your current medications. Try to reduce sodium intake to help improve your blood pressure.   Please schedule an appointment with derm clinic here to have the area removed since it is causing you anxiety.  Please follow-up with me in 4-6 weeks or sooner as needed.  If you have any questions or concerns, please do not hesitate to call the office at [phone].  Take Care,   [place] Glynda Soliday, DO  Mirtazapine orally disintegrating tablets What is this medicine? MIRTAZAPINE (mir TAZ a peen) is used to treat depression. This medicine may be used for other purposes; ask your health care provider or pharmacist if you have questions. COMMON BRAND NAME(S): Remeron SolTab What should I tell my health care provider before I take this medicine? They need to know if you have any of these conditions: -bipolar disorder -glaucoma -kidney disease -liver disease -phenylketonuria -suicidal thoughts -an unusual or allergic reaction to mirtazapine, other medicines, foods, dyes, or preservatives -pregnant or trying to get pregnant -breast-feeding How should I use this medicine? Take this medicine by mouth. Follow the directions on the prescription label. These tablets are made to dissolve in the mouth. Place the tablet in the mouth and allow it to dissolve, then swallow. You can take these tablets with water, but you do not have to. Take your medicine at regular intervals. Do not take your medicine more often than directed. Do not stop taking this medicine suddenly except upon the advice of  your doctor. Stopping this medicine too quickly may cause serious side effects or your condition may worsen. A special MedGuide will be given to you by the pharmacist with each prescription and refill. Be sure to read this information carefully each time. Talk to your pediatrician regarding the use of this medicine in children. Special care may be needed. Overdosage: If you think you have taken too much of this medicine contact a poison control center or emergency room at once. NOTE: This medicine is only for you. Do not share this medicine with others. What if I miss a dose? If you miss a dose, take it as soon as you can. If it is almost time for your next dose, take only that dose. Do not take double or extra doses. What may interact with this medicine? Do not take this medicine with any of the following medications: -linezolid -MAOIs like Carbex, Eldepryl, Marplan, Nardil, and Parnate -methylene blue (injected into a vein) This medicine may also interact with the following medications: -alcohol -antiviral medicines for HIV or AIDS -certain medicines that treat or prevent blood clots like warfarin -certain medicines for depression, anxiety, or psychotic disturbances -certain medicines for fungal infections like ketoconazole and itraconazole -certain medicines for migraine headache like almotriptan, eletriptan, frovatriptan, naratriptan, rizatriptan, sumatriptan, zolmitriptan -certain medicines for seizures like carbamazepine or phenytoin -certain medicines for sleep -cimetidine -erythromycin -fentanyl -lithium -medicines for blood pressure -nefazodone -rasagiline -rifampin -supplements like St. John's wort, kava kava, valerian -tramadol -tryptophan This list may not describe all possible interactions. Give your health care provider a list of all the medicines, herbs, non-prescription  drugs, or dietary supplements you use. Also tell them if you smoke, drink alcohol, or use illegal  drugs. Some items may interact with your medicine. What should I watch for while using this medicine? Tell your doctor if your symptoms do not get better or if they get worse. Visit your doctor or health care professional for regular checks on your progress. Because it may take several weeks to see the full effects of this medicine, it is important to continue your treatment as prescribed by your doctor. Patients and their families should watch out for new or worsening thoughts of suicide or depression. Also watch out for sudden changes in feelings such as feeling anxious, agitated, panicky, irritable, hostile, aggressive, impulsive, severely restless, overly excited and hyperactive, or not being able to sleep. If this happens, especially at the beginning of treatment or after a change in dose, call your health care professional. Bonita QuinYou may get drowsy or dizzy. Do not drive, use machinery, or do anything that needs mental alertness until you know how this drug affects you. Do not stand or sit up quickly, especially if you are an older patient. This reduces the risk of dizzy or fainting spells. Alcohol may interfere with the effect of this medicine. Avoid alcoholic drinks. This medicine may cause dry eyes and blurred vision. If you wear contact lenses you may feel some discomfort. Lubricating drops may help. See your eye doctor if the problem does not go away or is severe. Your mouth may get dry. Chewing sugarless gum or sucking hard candy, and drinking plenty of water may help. Contact your doctor if the problem does not go away or is severe. What side effects may I notice from receiving this medicine? Side effects that you should report to your doctor or health care professional as soon as possible: -allergic reactions like skin rash, itching or hives, swelling of the face, lips, or tongue -anxious -changes in vision -chest pain -confusion -elevated mood, decreased need for sleep, racing thoughts,  impulsive behavior -eye pain -fast, irregular heartbeat -feeling faint or lightheaded, falls -feeling agitated, angry, or irritable -fever or chills, sore throat -hallucination, loss of contact with reality -loss of balance or coordination -mouth sores -redness, blistering, peeling or loosening of the skin, including inside the mouth -restlessness, pacing, inability to keep still -seizures -stiff muscles -suicidal thoughts or other mood changes -trouble passing urine or change in the amount of urine -trouble sleeping -unusual bleeding or bruising -unusually weak or tired -vomiting Side effects that usually do not require medical attention (report to your doctor or health care professional if they continue or are bothersome): -change in appetite -constipation -dizziness -dry mouth -muscle aches or pains -nausea -tired -weight gain This list may not describe all possible side effects. Call your doctor for medical advice about side effects. You may report side effects to FDA at 1-800-FDA-1088. Where should I keep my medicine? Keep out of the reach of children. Store at room temperature between 15 and 30 degrees C (59 and 86 degrees F) Protect from light and moisture. Throw away any unused medicine after the expiration date. NOTE: This sheet is a summary. It may not cover all possible information. If you have questions about this medicine, talk to your doctor, pharmacist, or health care provider.  2018 Elsevier/Gold Standard (2016-04-29 17:28:52)

## 2018-02-06 NOTE — Assessment & Plan Note (Addendum)
Patient interested in counseling and will schedule to meet with behavioral health or see someone he knows. He is interested in starting medications- discussed risks and benefits of this. Gad of 10 today.  Started on mirtazapine 15 mg daily at night to help with sleep. Will monitor for weight gain closely as patient has struggled. Started on low dose. Instructed to call or return if he has any SI or HI.

## 2018-02-06 NOTE — Assessment & Plan Note (Signed)
Discussed recent weight loss. Patient down to 248 lbs and does cardio 4 days a week with personal trainer and has significantly changed his lifestyle and diet. Encouraged lower sodium diet and continuing his current lifestyle change to maintain weight loss and healthy habits.

## 2018-02-06 NOTE — Progress Notes (Signed)
irt  Subjective:    Patient ID: Scott Patterson, male    DOB: January 02, 1986, 32 y.o.   MRN: [id number]   CC:  HPI: 32 y/o with PMH of obesity and HTN presents for physical exam  Hypertension: - Medications: HCTZ 25mg   - Compliance: yes - Checking BP at home: no - Denies any SOB, CP, vision changes, LE edema, medication SEs, or symptoms of hypotension - Diet: low salt - Exercise: personal trainer, 4 times a week  Bump on groin: Never had before - present for 1-2 months - looks like a mole that has grown some - no discharge, not painful - skin colored  Anxiety: - trouble sleeping, only sleeps 4-5 hours each night due to worrying and active mind - Patient struggling with this and is interested in counseling  Smoking status reviewed - non smoker - social drinker - denies drug use  Review of Systems Per HPI, else denies recent illness, fever, headache, changes in vision, chest pain, shortness of breath  Patient Active Problem List   Diagnosis Date Noted  . Anxiety 02/06/2018  . Increased urinary frequency 01/20/2017  . Healthcare maintenance 01/20/2017  . Polyuria 01/26/2016  . Depression 01/11/2014  . Genital herpes 04/02/2011  . ESSENTIAL HYPERTENSION, BENIGN 12/11/2010  . OBESITY, NOS 02/09/2007  . Allergic rhinitis 02/09/2007  . Asthma, mild intermittent 02/09/2007     Objective:  BP 140/64   Pulse 75   Temp 98.2 F (36.8 C) (Oral)   Ht 6\' 2"  (1.88 m)   Wt 248 lb (112.5 kg)   SpO2 98%   BMI 31.84 kg/m  Vitals and nursing note reviewed  General: NAD, pleasant Cardiac: RRR, normal heart sounds, no murmurs Respiratory: CTAB, normal effort Abdomen: soft, nontender, nondistended.  Extremities: no edema or cyanosis. WWP. Skin: warm and dry, no rashes noted. 1 cm diameter papule located on R groin   Neuro: alert and oriented, no focal deficits Psych: normal affect  Depression screen [facility] 2/9 02/06/2018 02/06/2018 01/20/2017  Decreased Interest 1 0 0  Down,  Depressed, Hopeless 1 0 0  PHQ - 2 Score 2 0 0  Altered sleeping 0 - -  Tired, decreased energy 1 - -  Change in appetite 1 - -  Feeling bad or failure about yourself  0 - -  Trouble concentrating 0 - -  Moving slowly or fidgety/restless 1 - -  Suicidal thoughts 0 - -  PHQ-9 Score 5 - -   GAD 7 : Generalized Anxiety Score 02/06/2018  Nervous, Anxious, on Edge 1  Control/stop worrying 2  Worry too much - different things 2  Trouble relaxing 2  Restless 0  Easily annoyed or irritable 1  Afraid - awful might happen 2  Total GAD 7 Score 10  Anxiety Difficulty Somewhat difficult   Assessment & Plan:    OBESITY, NOS Discussed recent weight loss. Patient down to 248 lbs and does cardio 4 days a week with personal trainer and has significantly changed his lifestyle and diet. Encouraged lower sodium diet and continuing his current lifestyle change to maintain weight loss and healthy habits.   Anxiety Patient interested in counseling and will schedule to meet with behavioral health or see someone he knows. He is interested in starting medications- discussed risks and benefits of this. Gad of 10 today.  Started on mirtazapine 15 mg daily at night to help with sleep. Will monitor for weight gain closely as patient has struggled. Started on low dose. Instructed to call  or return if he has any SI or HI.   ESSENTIAL HYPERTENSION, BENIGN Blood pressure today 140/64. Patient instructed to continue with diet and exercise and low sodium diet. Will re-check pressure at visit in 4-6 weeks.    [place] Tabetha Haraway, DO Family Medicine Resident PGY-1

## 2018-02-06 NOTE — Assessment & Plan Note (Signed)
Blood pressure today 140/64. Patient instructed to continue with diet and exercise and low sodium diet. Will re-check pressure at visit in 4-6 weeks.

## 2018-03-08 ENCOUNTER — Ambulatory Visit: Payer: Self-pay | Admitting: Family Medicine

## 2018-03-23 ENCOUNTER — Ambulatory Visit: Payer: BLUE CROSS/BLUE SHIELD

## 2018-04-27 ENCOUNTER — Ambulatory Visit: Payer: BLUE CROSS/BLUE SHIELD | Admitting: Family Medicine

## 2018-04-28 ENCOUNTER — Other Ambulatory Visit: Payer: Self-pay

## 2018-04-28 ENCOUNTER — Ambulatory Visit (INDEPENDENT_AMBULATORY_CARE_PROVIDER_SITE_OTHER): Payer: BLUE CROSS/BLUE SHIELD | Admitting: Family Medicine

## 2018-04-28 ENCOUNTER — Encounter: Payer: Self-pay | Admitting: Family Medicine

## 2018-04-28 VITALS — BP 112/64 | HR 80 | Temp 97.8°F | Ht 74.0 in | Wt 235.0 lb

## 2018-04-28 DIAGNOSIS — I10 Essential (primary) hypertension: Secondary | ICD-10-CM

## 2018-04-28 DIAGNOSIS — M79604 Pain in right leg: Secondary | ICD-10-CM | POA: Insufficient documentation

## 2018-04-28 MED ORDER — FLUTICASONE PROPIONATE 50 MCG/ACT NA SUSP: 2.0000 | Freq: Every day | NASAL | 2 refills | Status: DC

## 2018-04-28 MED ORDER — CANDESARTAN CILEXETIL 4 MG PO TABS: 4.0000 mg | ORAL_TABLET | Freq: Every day | ORAL | 11 refills | Status: DC

## 2018-04-28 NOTE — Assessment & Plan Note (Signed)
Differential includes but is not limited to meniscal tear, osteoarthritis due to previous morbid obesity, and stress fracture due to frequent exercise.  Will obtain complete R knee x-rays and refer to sports medicine for further evaluation and treatment.

## 2018-04-28 NOTE — Assessment & Plan Note (Signed)
Will stop patient's HCTZ and start candesartan 4 mg daily.

## 2018-04-28 NOTE — Patient Instructions (Addendum)
It was nice meeting you today Scott Patterson!  For your blood pressure, please take candesartan once per day.  This should keep you from needing to urinate so frequently. I also sent Flonase to your pharmacy.  For your right knee, please go to [facility] at [facility] for x-rays.  You can drop in 8-5 M-F. I am also referring you to sports medicine, and they will be in contact with you to set up an appointment.  If you have any questions or concerns, please feel free to call the clinic.   Be well,  Dr. Frances Furbish

## 2018-04-28 NOTE — Progress Notes (Signed)
   Subjective:    Scott Patterson - 32 y.o. male MRN [id number]  Date of birth: 1986-05-01  HPI  Scott Patterson is here for right leg pain.  He says the pain is most significant around the medial area of his R knee, but his R leg muscles feel tight and stiff as well.  The pain started around 2-3 months ago, but he cannot recall an inciting event.  He has been exercising five days per week, with exercise consisting of running on the treadmill and weight machines.  During the last few weeks, he has not been able to run due to the pain.  He has had to walk with a limp and notes that, while the pain is less when he is not bearing any weight on his leg, he still feels it.  He also notes a clicking feeling when flexing and extending his knee.  He has not tried anything to help with the pain.  He also would like to try a different medication for his blood pressure since he is urinating too frequently due to the HCTZ.  Health Maintenance:   Health Maintenance Due  Topic Date Due  . TETANUS/TDAP  09/12/2012    -  reports that he has never smoked. He has never used smokeless tobacco. - Review of Systems: Per HPI. - Past Medical History: Patient Active Problem List   Diagnosis Date Noted  . Right leg pain 04/28/2018  . Anxiety 02/06/2018  . Increased urinary frequency 01/20/2017  . Healthcare maintenance 01/20/2017  . Polyuria 01/26/2016  . Depression 01/11/2014  . Genital herpes 04/02/2011  . ESSENTIAL HYPERTENSION, BENIGN 12/11/2010  . OBESITY, NOS 02/09/2007  . Allergic rhinitis 02/09/2007  . Asthma, mild intermittent 02/09/2007   - Medications: reviewed and updated   Objective:   Physical Exam BP 112/64   Pulse 80   Temp 97.8 F (36.6 C) (Oral)   Ht  (1.88 m)   Wt 235 lb (106.6 kg)   SpO2 99%   BMI 30.17 kg/m  Gen: NAD, alert, cooperative with exam, well-appearing HEENT: NCAT, clear conjunctiva, supple neck CV: RRR, good S1/S2, no murmur, no edema  Resp: CTABL, no wheezes,  non-labored Skin: no rashes, normal turgor  Neuro: no gross deficits.  Psych: good insight, alert and oriented Musculoskeletal: no visual difference between L and R knees, minimally tender to palpation of medial area of R knee, no crepitus felt, anterior and posterior drawer testing negative, mild pain reported during [facility] test of R knee      Assessment & Plan:   Right leg pain Differential includes but is not limited to meniscal tear, osteoarthritis due to previous morbid obesity, and stress fracture due to frequent exercise.  Will obtain complete R knee x-rays and refer to sports medicine for further evaluation and treatment.  ESSENTIAL HYPERTENSION, BENIGN Will stop patient's HCTZ and start candesartan 4 mg daily.    Lezlie Octave, M.D. 04/28/2018, 1:57 PM PGY-1, [facility]

## 2018-05-01 ENCOUNTER — Ambulatory Visit (INDEPENDENT_AMBULATORY_CARE_PROVIDER_SITE_OTHER): Payer: BLUE CROSS/BLUE SHIELD | Admitting: Sports Medicine

## 2018-05-01 VITALS — BP 126/88 | Ht 74.0 in | Wt 234.0 lb

## 2018-05-01 DIAGNOSIS — M25569 Pain in unspecified knee: Secondary | ICD-10-CM

## 2018-05-01 NOTE — Progress Notes (Signed)
     Date of Visit: 04/24/2018   HPI:  Scott Patterson is a 32 y.o. male with no significant medical history who presents with a 1.5 week history of right knee pain. He does not recall any trigger for the onset of his pain. The pain came on gradually one day, first noticed while walking. He reports that the pain is primarily located in the region of the anterolateral joint line. A few days ago, he reports symptoms on the medial side of his knee as well. It increases to a 6-7/10 in severity. He has been icing and resting. The pain has not improved, though the swelling is mildly reduced. Weight bearing activity and particularly going up stairs makes the pain worse. It is associated with a feeling of weakness. He runs for 30-45 minutes 5 days per week, but has been resting since symptom onset. He denies locking or catching of the knee, but does report occasional "popping." He denies ecchymosis or other skin changes. He has never injured the knee before or experienced similar pain previously.  ROS: See HPI.   PHYSICAL EXAM: BP 108/74   Ht  (1.702 m)   Wt 157 lb (71.2 kg)   BMI 24.59 kg/m  Gen: well-appearing, no acute distress. Heart: warm, well-perfused Lungs: non-labored breathing  Right Knee: No deformity. No effusion or erythema appreciated. TTP at the lateral femoral epicondyle. Mild TTP at inferolateral patellar border. No tenderness with patellar compression. No tenderness at quadriceps or patellar tendons. No medial or lateral joint line tenderness. Full ROM with flexion/extension. Mild weakness with flexion/extension relative to left. Notable weakness with hip abduction compared to left. No ligamentous laxity with anterior/posterior drawer or varus/valgus stress. Negative Lachman's. Negative McMurray's, and Thessaly's. NVI distally.  Left Knee: No deformity. FROM with 5/5 strength. No tenderness to palpation. NVI distally.   ASSESSMENT/PLAN: Right knee pain: Patient presents with  insidious onset of right knee pain over the course of 1.5 weeks. Pain at the lateral femoral epicondyle and notable weakness of hip abductors is suggestive of iliotibial band syndrome. Patellofemoral pain is also on differential, though the location of pain and presence of abductor weakness is more consistent with ITBS. Provided patient with daily home exercises to help strengthen the hip abductors. He may continue to use ice and ibuprofen as needed. Recommended activity modification to avoid exercises that cause pain. Will follow up in 3-4 weeks to reassess hip abductor strength and right knee pain.    FOLLOW UP: Follow up in 3-4 weeks for right knee pain.   Dario Ave, Medical Student   Patient seen and evaluated with medical student. I agree with the above plan of care. Patient will start a home exercise program to strengthen hip abductors and will continue with over-the-counter anti-inflammatories as needed. Follow-up in 3-4 weeks for reevaluation. His PCP ordered an x-ray of his knee which I would like for him to get prior to his follow-up visit.

## 2018-05-02 ENCOUNTER — Encounter: Payer: Self-pay | Admitting: Sports Medicine

## 2018-05-09 ENCOUNTER — Ambulatory Visit: Payer: BLUE CROSS/BLUE SHIELD | Admitting: Internal Medicine

## 2018-05-09 ENCOUNTER — Encounter: Payer: Self-pay | Admitting: Internal Medicine

## 2018-05-09 VITALS — BP 118/80 | HR 78 | Temp 98.3°F | Wt 232.8 lb

## 2018-05-09 DIAGNOSIS — M7630 Iliotibial band syndrome, unspecified leg: Secondary | ICD-10-CM

## 2018-05-09 MED ORDER — MELOXICAM 15 MG PO TABS: 15.0000 mg | ORAL_TABLET | Freq: Every day | ORAL | 0 refills | Status: DC

## 2018-05-09 MED ORDER — KETOROLAC TROMETHAMINE 30 MG/ML IJ SOLN
30.0000 mg | Freq: Once | INTRAMUSCULAR | Status: AC
  Administered 2018-05-09: 30 mg via INTRAMUSCULAR

## 2018-05-09 NOTE — Progress Notes (Signed)
   [facility] Noralee Chars, MD Phone: [phone]  Reason For Visit: Bilateral upper leg   # Bilateral leg pain  Patient states he has had leg pain for over 2 months.  He states that it started in his knees and has now moved up into his legs.  He states that it alternates between his right leg and his left leg.  He denies any back pain.  Patient was recently seen by sports medicine  with concern for iliotibial pain syndrome.. Patient states he has been taking ibuprofen for the pain but this is not helped very much.  He has not tried icing or using a heating pad.  No swelling, no erythema, no fevers, no fatigue, no numbness or tingling.  states he has limited his exercise, he is now only doing upper body exercising  Past Medical History Reviewed problem list.  Medications- reviewed and updated No additions to family history Social history- patient is a non -smoker  Objective: BP 118/80 (BP Location: Left Arm, Patient Position: Sitting, Cuff Size: Normal)   Pulse 78   Temp 98.3 F (36.8 C) (Oral)   Wt 232 lb 12.8 oz (105.6 kg)   SpO2 97%   BMI 29.89 kg/m  Gen: NAD, alert, cooperative with exam  MSK: Normal gait, patient identifies iliotibial ligament as the area of pain, no pain on palpation.  No abnormalities noted on inspection.  5 out of 5 strength in bilateral lower extremities,  Skin: dry, intact, no rashes or lesions Neuro: Strength and sensation grossly intact   Assessment/Plan: See problem based a/p  Iliotibial band syndrome Likely iliotibial pain band syndrome-no sciatic pain no lower back pain making radiculopathy unlikely, patient was previously seen by Dr. Margaretha Sheffield at the time he had pain at point of ligament insertion, now he notes pain along either side of both ligaments. Will provide patient with a Toradol shot Will provide patient with Mobic to take for 10-15 days -breakthrough pain treated with Tylenol Follow-up with sports medicine

## 2018-05-09 NOTE — Patient Instructions (Signed)
Please take mobic daily. Can take with tylenol as discussed for breakthrough pain.

## 2018-05-09 NOTE — Assessment & Plan Note (Signed)
Likely iliotibial pain band syndrome-no sciatic pain no lower back pain making radiculopathy unlikely, patient was previously seen by Dr. Margaretha Sheffield at the time he had pain at point of ligament insertion, now he notes pain along either side of both ligaments. Will provide patient with a Toradol shot Will provide patient with Mobic to take for 10-15 days -breakthrough pain treated with Tylenol Follow-up with sports medicine

## 2018-05-14 ENCOUNTER — Other Ambulatory Visit: Payer: Self-pay

## 2018-05-14 ENCOUNTER — Encounter (HOSPITAL_BASED_OUTPATIENT_CLINIC_OR_DEPARTMENT_OTHER): Payer: Self-pay | Admitting: Emergency Medicine

## 2018-05-14 ENCOUNTER — Emergency Department (HOSPITAL_BASED_OUTPATIENT_CLINIC_OR_DEPARTMENT_OTHER)
Admission: EM | Admit: 2018-05-14 | Discharge: 2018-05-14 | Disposition: A | Discharge status: Home / Self Care | Payer: BLUE CROSS/BLUE SHIELD | Attending: Emergency Medicine | Admitting: Emergency Medicine

## 2018-05-14 DIAGNOSIS — M7918 Myalgia, other site: Secondary | ICD-10-CM | POA: Insufficient documentation

## 2018-05-14 DIAGNOSIS — Z79899 Other long term (current) drug therapy: Secondary | ICD-10-CM | POA: Insufficient documentation

## 2018-05-14 DIAGNOSIS — I10 Essential (primary) hypertension: Secondary | ICD-10-CM | POA: Diagnosis not present

## 2018-05-14 DIAGNOSIS — M791 Myalgia, unspecified site: Secondary | ICD-10-CM

## 2018-05-14 DIAGNOSIS — J45909 Unspecified asthma, uncomplicated: Secondary | ICD-10-CM | POA: Insufficient documentation

## 2018-05-14 LAB — URINALYSIS, ROUTINE W REFLEX MICROSCOPIC
Bilirubin Urine: NEGATIVE
Glucose, UA: NEGATIVE mg/dL
Hgb urine dipstick: NEGATIVE
KETONES UR: 15 mg/dL — AB
LEUKOCYTES UA: NEGATIVE
NITRITE: NEGATIVE
PH: 6 (ref 5.0–8.0)
Protein, ur: NEGATIVE mg/dL
Specific Gravity, Urine: >1.03 — ABNORMAL HIGH (ref 1.005–1.030)

## 2018-05-14 LAB — COMPREHENSIVE METABOLIC PANEL
ALBUMIN: 4.4 g/dL (ref 3.5–5.0)
ALK PHOS: 68 U/L (ref 38–126)
ALT: 33 U/L (ref 17–63)
ANION GAP: 10 (ref 5–15)
AST: 26 U/L (ref 15–41)
BILIRUBIN TOTAL: 1.3 mg/dL — AB (ref 0.3–1.2)
BUN: 17 mg/dL (ref 6–20)
CALCIUM: 9.2 mg/dL (ref 8.9–10.3)
CO2: 25 mmol/L (ref 22–32)
CREATININE: 1.23 mg/dL (ref 0.61–1.24)
Chloride: 103 mmol/L (ref 101–111)
GFR calc Af Amer: >60 mL/min (ref >60)
GFR calc non Af Amer: >60 mL/min (ref >60)
GLUCOSE: 100 mg/dL — AB (ref 65–99)
Potassium: 3.6 mmol/L (ref 3.5–5.1)
Sodium: 138 mmol/L (ref 135–145)
TOTAL PROTEIN: 8 g/dL (ref 6.5–8.1)

## 2018-05-14 LAB — CBC WITH DIFFERENTIAL/PLATELET
BASOS PCT: 0 %
Basophils Absolute: 0 10*3/uL (ref 0.0–0.1)
Eosinophils Absolute: 0.1 10*3/uL (ref 0.0–0.7)
Eosinophils Relative: 1 %
HEMATOCRIT: 46 % (ref 39.0–52.0)
HEMOGLOBIN: 15.7 g/dL (ref 13.0–17.0)
Lymphocytes Relative: 16 %
Lymphs Abs: 0.7 10*3/uL (ref 0.7–4.0)
MCH: 26.4 pg (ref 26.0–34.0)
MCHC: 34.1 g/dL (ref 30.0–36.0)
MCV: 77.3 fL — ABNORMAL LOW (ref 78.0–100.0)
MONOS PCT: 5 %
Monocytes Absolute: 0.2 10*3/uL (ref 0.1–1.0)
NEUTROS ABS: 3.2 10*3/uL (ref 1.7–7.7)
NEUTROS PCT: 78 %
Platelets: 154 10*3/uL (ref 150–400)
RBC: 5.95 MIL/uL — ABNORMAL HIGH (ref 4.22–5.81)
RDW: 14.9 % (ref 11.5–15.5)
WBC: 4.1 10*3/uL (ref 4.0–10.5)

## 2018-05-14 LAB — CK: Total CK: 379 U/L (ref 49–397)

## 2018-05-14 MED ORDER — KETOROLAC TROMETHAMINE 30 MG/ML IJ SOLN
30.0000 mg | Freq: Once | INTRAMUSCULAR | Status: AC
  Administered 2018-05-14: 30 mg via INTRAMUSCULAR
  Filled 2018-05-14: qty 1

## 2018-05-14 MED ORDER — NAPROXEN 500 MG PO TABS: 500.0000 mg | ORAL_TABLET | Freq: Two times a day (BID) | ORAL | 0 refills | Status: DC | PRN

## 2018-05-14 NOTE — Discharge Instructions (Addendum)
It was my pleasure taking care of you today!   Rest for the next 2-3 days. Warm baths with Epson salt can also help as well.   Naproxen as needed for pain.   Follow up with your primary care doctor in the next 1-2 weeks, sooner if symptoms worsen.   Return to ER for new or worsening symptoms, any additional concerns.

## 2018-05-14 NOTE — ED Triage Notes (Signed)
Generalized muscle pain for several month. Pt has been exercising a lot for weight loss.

## 2018-05-14 NOTE — ED Provider Notes (Signed)
[facility] EMERGENCY DEPARTMENT Provider Note   CSN: [id number] Arrival date & time: 05/14/18  1431     History   Chief Complaint Chief Complaint  Patient presents with  . Muscle pain    HPI Scott Patterson is a 32 y.o. male.  The history is provided by the patient and medical records. No language interpreter was used.   Scott Patterson is a 32 y.o. male  with a PMH of HTN, asthma, anxiety/depression who presents to the Emergency Department complaining of myalgias over the last two months. He reports initially he was having pain to the left upper thigh.  He saw his doctor at that time who gave him 10 pills of meloxicam and told him it was likely a muscle strain.  He took this medication with some relief.  He reports over the last 2 months, he has had intermittent myalgias to different areas.  Sometimes it is his left upper leg, sometimes forearms, sometimes right upper leg.  He has never had any joint pain such as elbows, shoulders or knees.  Only 2 major muscle groups.  He denies any neck or back pain.  He states that he has been working out with a trainer 5 to 6 days a week.  He started doing this a few months prior to symptom onset. Denies blood in urine, fever, chills. No recent insect bites / camping.  No alleviating or aggravating factors noted.  Past Medical History:  Diagnosis Date  . Anxiety   . Asthma   . Depression   . Hypertension     Patient Active Problem List   Diagnosis Date Noted  . 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 Right leg pain 04/28/2018  . Anxiety 02/06/2018  . Increased urinary frequency 01/20/2017  . Healthcare maintenance 01/20/2017  . Polyuria 01/26/2016  . Depression 01/11/2014  . Genital herpes 04/02/2011  . ESSENTIAL HYPERTENSION, BENIGN 12/11/2010  . OBESITY, NOS 02/09/2007  . Allergic rhinitis 02/09/2007  . Asthma, mild intermittent 02/09/2007    Past Surgical History:  Procedure Laterality Date  . None          Home  Medications    Prior to Admission medications   Medication Sig Start Date End Date Taking? Authorizing Provider  albuterol (PROVENTIL HFA;VENTOLIN HFA) 108 (90 Base) MCG/ACT inhaler Inhale 2 puffs into the lungs every 6 (six) hours as needed for wheezing or shortness of breath. 01/20/17   Haney, Arlyss Repress A, MD  candesartan (ATACAND) 4 MG tablet Take 1 tablet (4 mg total) by mouth daily. 04/28/18   Lennox Solders, MD  cetirizine (ZYRTEC) 10 MG tablet Take 1 tablet (10 mg total) by mouth daily. 01/20/17   Bonney Aid, MD  cyclobenzaprine (FLEXERIL) 5 MG tablet Take 1 tablet (5 mg total) by mouth 3 (three) times daily as needed for muscle spasms. 02/06/18   Shirley, [place], DO  fluticasone (FLONASE) 50 MCG/ACT nasal spray Place 2 sprays into both nostrils daily. 04/28/18   Lennox Solders, MD  meloxicam (MOBIC) 15 MG tablet Take 1 tablet (15 mg total) by mouth daily. 05/09/18   Mikell, Antionette Poles, MD  mirtazapine (REMERON) 15 MG tablet Take 1 tablet (15 mg total) by mouth at bedtime. 02/06/18   Shirley, [place], DO  mometasone (NASONEX) 50 MCG/ACT nasal spray Place 2 sprays into the nose daily. 01/20/17   Haney, Jeanann Lewandowsky, MD  naproxen (NAPROSYN) 500 MG tablet Take 1 tablet (500 mg total) by mouth 2 (two) times daily  as needed. 05/14/18   Satara Virella, Chase Picket, PA-C    Family History Family History  Problem Relation Age of Onset  . Depression Mother   . Hypertension Mother   . Alcohol abuse Father   . Drug abuse Father   . Stroke Father   . Hypertension Father   . Alcohol abuse Paternal Uncle   . Hypertension Brother     Social History Social History   Tobacco Use  . Smoking status: Never Smoker  . Smokeless tobacco: Never Used  Substance Use Topics  . Alcohol use: No  . Drug use: No     Allergies   Patient has no known allergies.   Review of Systems Review of Systems  Constitutional: Negative for chills and fever.  Musculoskeletal: Positive for myalgias. Negative for arthralgias,  back pain and neck pain.  Neurological: Negative for weakness and numbness.     Physical Exam Updated Vital Signs BP 139/88   Pulse 72   Temp 98.8 F (37.1 C) (Oral)   Resp 16   Ht 6\' 2"  (1.88 m)   Wt 108.4 kg (239 lb)   SpO2 100%   BMI 30.69 kg/m   Physical Exam  Constitutional: He is oriented to person, place, and time. He appears well-developed and well-nourished. No distress.  HENT:  Head: Normocephalic and atraumatic.  Cardiovascular: Normal rate, regular rhythm and normal heart sounds.  No murmur heard. Pulmonary/Chest: Effort normal and breath sounds normal. No respiratory distress.  Abdominal: Soft. He exhibits no distension. There is no tenderness.  Musculoskeletal:  No C/T/L spine tenderness. 5/5 muscle strength in all four extremities. Full ROM of all extremities without difficulty and no pain. Intact distal pulses x 4. No tenderness to ankles/knees/elbows/shoulders. No joint swelling. No overlying skin changes. Mild diffuse tenderness to left upper thigh and bilateral upper arm musculature.  Neurological: He is alert and oriented to person, place, and time.  Skin: Skin is warm and dry.  Nursing note and vitals reviewed.    ED Treatments / Results  Labs (all labs ordered are listed, but only abnormal results are displayed) Labs Reviewed  URINALYSIS, ROUTINE W REFLEX MICROSCOPIC - Abnormal; Notable for the following components:      Result Value   Specific Gravity, Urine >1.030 (*)    Ketones, ur 15 (*)    All other components within normal limits  CBC WITH DIFFERENTIAL/PLATELET - Abnormal; Notable for the following components:   RBC 5.95 (*)    MCV 77.3 (*)    All other components within normal limits  COMPREHENSIVE METABOLIC PANEL - Abnormal; Notable for the following components:   Glucose, Bld 100 (*)    Total Bilirubin 1.3 (*)    All other components within normal limits  CK    EKG None  Radiology No results found.  Procedures Procedures  (including critical care time)  Medications Ordered in ED Medications  ketorolac (TORADOL) 30 MG/ML injection 30 mg (30 mg Intramuscular Given 05/14/18 1649)     Initial Impression / Assessment and Plan / ED Course  I have reviewed the triage vital signs and the nursing notes.  Pertinent labs & imaging results that were available during my care of the patient were reviewed by me and considered in my medical decision making (see chart for details).    Scott Patterson is a 32 y.o. male who presents to ED for intermittent myalgias over the last 2 months.  Seen by his primary care doctor at onset where he was  given short course of meloxicam.  Since that time, he feels as if symptoms have been getting worse.  On exam, patient is afebrile, hemodynamically stable.  He has no joint tenderness or swelling.  No overlying skin changes.  Full range of motion and 5/5 strength without any reproducible pain.  He does have mild diffuse tenderness to major muscle groups of the upper extremities and thighs.  No C/T/L-spine tenderness.  No recent tick bites to suggest tickborne illnesses etiology of myalgias.  Normal CK.  CBC and CMP are reviewed and reassuring.  He has been on extensive workout program with trainer 5 to 6 days a week.  Will treat symptomatically and have him rest for a few days then follow-up with his primary care doctor.Evaluation does not show pathology that would require ongoing emergent intervention or inpatient treatment.  Reasons to return to ER discussed and all questions answered.   Final Clinical Impressions(s) / ED Diagnoses   Final diagnoses:  Myalgia    ED Discharge Orders        Ordered    naproxen (NAPROSYN) 500 MG tablet  2 times daily PRN     05/14/18 1704       Bertis Hustead, Chase PicketJaime Pilcher, PA-C 05/14/18 1715    Rolan BuccoBelfi, Melanie, MD 05/14/18 2304

## 2018-05-15 ENCOUNTER — Encounter: Payer: Self-pay | Admitting: Family Medicine

## 2018-05-15 ENCOUNTER — Ambulatory Visit
Admission: RE | Admit: 2018-05-15 | Discharge: 2018-05-15 | Disposition: A | Discharge status: Home / Self Care | Payer: BLUE CROSS/BLUE SHIELD | Source: Ambulatory Visit | Attending: Family Medicine | Admitting: Family Medicine

## 2018-05-15 ENCOUNTER — Ambulatory Visit: Payer: BLUE CROSS/BLUE SHIELD | Admitting: Internal Medicine

## 2018-05-15 DIAGNOSIS — M79604 Pain in right leg: Secondary | ICD-10-CM

## 2018-05-16 ENCOUNTER — Ambulatory Visit: Payer: BLUE CROSS/BLUE SHIELD | Admitting: Family Medicine

## 2018-05-17 ENCOUNTER — Ambulatory Visit (HOSPITAL_COMMUNITY)
Admission: EM | Admit: 2018-05-17 | Discharge: 2018-05-17 | Disposition: A | Discharge status: Home / Self Care | Payer: BLUE CROSS/BLUE SHIELD | Attending: Internal Medicine | Admitting: Internal Medicine

## 2018-05-17 ENCOUNTER — Encounter (HOSPITAL_COMMUNITY): Payer: Self-pay | Admitting: Emergency Medicine

## 2018-05-17 ENCOUNTER — Other Ambulatory Visit: Payer: Self-pay

## 2018-05-17 DIAGNOSIS — M62838 Other muscle spasm: Secondary | ICD-10-CM | POA: Diagnosis not present

## 2018-05-17 MED ORDER — CYCLOBENZAPRINE HCL 5 MG PO TABS: 5.0000 mg | ORAL_TABLET | Freq: Every day | ORAL | 0 refills | Status: DC

## 2018-05-17 NOTE — Discharge Instructions (Signed)
Heat application, light and regular activity. Continue with twice a day naproxen as previously prescribed. May use muscle relaxer at night, do not take if driving or drinking alcohol as may cause drowsiness.  Continue to follow with sports medicine and/or your PCP for persistent symptoms.

## 2018-05-17 NOTE — ED Triage Notes (Signed)
Left shoulder pain involving sharp, shooting pain.  Pain started 15 minutes prior to arrival at [facility]

## 2018-05-17 NOTE — ED Provider Notes (Signed)
[facility]    CSN: [id number] Arrival date & time: 05/17/18  1418     History   Chief Complaint Chief Complaint  Patient presents with  . Shoulder Pain    HPI Scott Patterson is a 32 y.o. male.   Scott Patterson presents with left medial shoulder/neck pain which started today, worse with certain movements, feels "stiff". No known trigger or injury. Has been seen multiple times recently due to different arthralgias and myalgias, most recently has been knee pain. Saw his PCP yesterday, was started on BID naproxen. Seeing sports medicine tomorrow. He is left handed. He works as a Conservator, museum/gallery. He had been working with a trainer but had stopped due to differing pains. No chest pain, shortness of breath , weakness, numbness. Occasional tingling to the left arm. No neck injury. Hx of anxiety, asthma, depression, htn.    ROS per HPI.      Past Medical History:  Diagnosis Date  . Anxiety   . Asthma   . Depression   . Hypertension     Patient Active Problem List   Diagnosis Date Noted  . 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 Right leg pain 04/28/2018  . Anxiety 02/06/2018  . Increased urinary frequency 01/20/2017  . Healthcare maintenance 01/20/2017  . Polyuria 01/26/2016  . Depression 01/11/2014  . Genital herpes 04/02/2011  . ESSENTIAL HYPERTENSION, BENIGN 12/11/2010  . OBESITY, NOS 02/09/2007  . Allergic rhinitis 02/09/2007  . Asthma, mild intermittent 02/09/2007    Past Surgical History:  Procedure Laterality Date  . None         Home Medications    Prior to Admission medications   Medication Sig Start Date End Date Taking? Authorizing Provider  albuterol (PROVENTIL HFA;VENTOLIN HFA) 108 (90 Base) MCG/ACT inhaler Inhale 2 puffs into the lungs every 6 (six) hours as needed for wheezing or shortness of breath. 01/20/17   Haney, Arlyss Repress A, MD  candesartan (ATACAND) 4 MG tablet Take 1 tablet (4 mg total) by mouth daily. 04/28/18   Lennox Solders, MD    cetirizine (ZYRTEC) 10 MG tablet Take 1 tablet (10 mg total) by mouth daily. 01/20/17   Velora Heckler A, MD  cyclobenzaprine (FLEXERIL) 5 MG tablet Take 1 tablet (5 mg total) by mouth at bedtime. 05/17/18   Georgetta Haber, NP  fluticasone (FLONASE) 50 MCG/ACT nasal spray Place 2 sprays into both nostrils daily. 04/28/18   Lennox Solders, MD  meloxicam (MOBIC) 15 MG tablet Take 1 tablet (15 mg total) by mouth daily. 05/09/18   Mikell, Antionette Poles, MD  mirtazapine (REMERON) 15 MG tablet Take 1 tablet (15 mg total) by mouth at bedtime. 02/06/18   Shirley, [place], DO  mometasone (NASONEX) 50 MCG/ACT nasal spray Place 2 sprays into the nose daily. 01/20/17   Haney, Jeanann Lewandowsky, MD  naproxen (NAPROSYN) 500 MG tablet Take 1 tablet (500 mg total) by mouth 2 (two) times daily as needed. 05/14/18   Ward, Chase Picket, PA-C    Family History Family History  Problem Relation Age of Onset  . Depression Mother   . Hypertension Mother   . Alcohol abuse Father   . Drug abuse Father   . Stroke Father   . Hypertension Father   . Alcohol abuse Paternal Uncle   . Hypertension Brother     Social History Social History   Tobacco Use  . Smoking status: Never Smoker  . Smokeless tobacco: Never Used  Substance Use Topics  . Alcohol  use: No  . Drug use: No     Allergies   Patient has no known allergies.   Review of Systems Review of Systems   Physical Exam Triage Vital Signs ED Triage Vitals  Enc Vitals Group     BP 05/17/18 1441 (!) 144/89     Pulse Rate 05/17/18 1441 83     Resp 05/17/18 1441 18     Temp 05/17/18 1441 97.9 F (36.6 C)     Temp Source 05/17/18 1441 Oral     SpO2 05/17/18 1441 100 %     Weight --      Height --      Head Circumference --      Peak Flow --      Pain Score 05/17/18 1440 8     Pain Loc --      Pain Edu? --      Excl. in GC? --    No data found.  Updated Vital Signs BP (!) 144/89 (BP Location: Left Arm)   Pulse 83   Temp 97.9 F (36.6 C) (Oral)    Resp 18   SpO2 100%    Physical Exam  Constitutional: He is oriented to person, place, and time. He appears well-developed and well-nourished.  Cardiovascular: Normal rate and regular rhythm.  Pulmonary/Chest: Effort normal and breath sounds normal.  Musculoskeletal:       Left shoulder: Normal.       Left elbow: Normal.       Left wrist: Normal.       Cervical back: He exhibits tenderness and pain. He exhibits normal range of motion, no bony tenderness, no swelling, no edema, no deformity, no laceration, no spasm and normal pulse.       Back:       Left upper arm: Normal.       Left forearm: Normal.  Left trapezius with tenderness on palpation, "stiffnes" with neck rotation; arm bilaterally with equal strength, sensation intact, full shoulder ROM; without spinous process tenderness or altered ROM to neck; strong radial pulses bilaterally   Neurological: He is alert and oriented to person, place, and time.  Skin: Skin is warm and dry.     UC Treatments / Results  Labs (all labs ordered are listed, but only abnormal results are displayed) Labs Reviewed - No data to display  EKG None  Radiology No results found.  Procedures Procedures (including critical care time)  Medications Ordered in UC Medications - No data to display  Initial Impression / Assessment and Plan / UC Course  I have reviewed the triage vital signs and the nursing notes.  Pertinent labs & imaging results that were available during my care of the patient were reviewed by me and considered in my medical decision making (see chart for details).     Muscular strain vs spasm on exam. Without cardiac or neurological findings/risk factors at this time. Flexeril at night as needed, heat application, light and regular activity, exercises provided. Continue with daily naproxen and following with PCP/Sports medicine. Patient verbalized understanding and agreeable to plan.    Final Clinical Impressions(s) / UC  Diagnoses   Final diagnoses:  Trapezius muscle spasm     Discharge Instructions     Heat application, light and regular activity. Continue with twice a day naproxen as previously prescribed. May use muscle relaxer at night, do not take if driving or drinking alcohol as may cause drowsiness.  Continue to follow with sports medicine and/or your  PCP for persistent symptoms.     ED Prescriptions    Medication Sig Dispense Auth. Provider   cyclobenzaprine (FLEXERIL) 5 MG tablet Take 1 tablet (5 mg total) by mouth at bedtime. 15 tablet Georgetta Haber, NP     Controlled Substance Prescriptions [place] Controlled Substance Registry consulted? Not Applicable   Georgetta Haber, NP 05/17/18 [phone]

## 2018-05-18 ENCOUNTER — Ambulatory Visit: Payer: BLUE CROSS/BLUE SHIELD | Attending: Sports Medicine | Admitting: Physical Therapy

## 2018-05-18 ENCOUNTER — Ambulatory Visit (INDEPENDENT_AMBULATORY_CARE_PROVIDER_SITE_OTHER): Payer: BLUE CROSS/BLUE SHIELD | Admitting: Sports Medicine

## 2018-05-18 VITALS — BP 120/84 | Ht 74.0 in | Wt 232.0 lb

## 2018-05-18 DIAGNOSIS — M25662 Stiffness of left knee, not elsewhere classified: Secondary | ICD-10-CM | POA: Insufficient documentation

## 2018-05-18 DIAGNOSIS — R262 Difficulty in walking, not elsewhere classified: Secondary | ICD-10-CM

## 2018-05-18 DIAGNOSIS — M25562 Pain in left knee: Secondary | ICD-10-CM | POA: Diagnosis not present

## 2018-05-18 DIAGNOSIS — M25661 Stiffness of right knee, not elsewhere classified: Secondary | ICD-10-CM | POA: Insufficient documentation

## 2018-05-18 DIAGNOSIS — M25561 Pain in right knee: Secondary | ICD-10-CM

## 2018-05-18 MED ORDER — NAPROXEN 500 MG PO TABS: 500.0000 mg | ORAL_TABLET | Freq: Two times a day (BID) | ORAL | 0 refills | Status: DC | PRN

## 2018-05-18 NOTE — Therapy (Signed)
[facility] [address] [place], [place], [address] Phone: [phone]   Fax:  [phone]  Physical Therapy Evaluation  Patient Details  Name: Scott Patterson MRN: [id number] Date of Birth: 12-15-1985 Referring Provider: Dr. Marsa Arisimothy Ryan Draper    Encounter Date: 05/18/2018  PT End of Session - 05/18/18 1233    Visit Number  1    Number of Visits  12    Date for PT Re-Evaluation  06/29/18    PT Start Time  1159    PT Stop Time  1244    PT Time Calculation (min)  45 min    Activity Tolerance  Patient tolerated treatment well    Behavior During Therapy  [facility] for tasks assessed/performed       Past Medical History:  Diagnosis Date  . Anxiety   . Asthma   . Depression   . Hypertension     Past Surgical History:  Procedure Laterality Date  . None      There were no vitals filed for this visit.   Subjective Assessment - 05/18/18 1201    Subjective  Pt was at MD (Dr. Margaretha Sheffieldraper) today.  Has been having Rt knee pain/popping  for about a month.  L knee hurts as well.  Was told he has mild arthritis. Pain 8/10 in bilateral anterior knees.  He complains of weakness, crepitus and instability.  He recently lost 90 lbs over the past 1 yr.  He has limited his workouts (cardio, TM) at the gym.  He has difficulty going up and down stairs.  He works and is limited in his ability to walk and stand comfortably.     Pertinent History  asthma, HTN    Limitations  Sitting;Lifting;Standing;Walking;House hold activities    How long can you stand comfortably?  15 min     How long can you walk comfortably?  15 min     Diagnostic tests  XR done shpowed mild arthiris     Patient Stated Goals  Pt would like to get my knees stronger, cont to workout.     Currently in Pain?  Yes    Pain Score  8     Pain Location  Knee    Pain Orientation  Right;Left    Pain Descriptors / Indicators  Aching    Pain Type  Acute pain    Pain Radiating Towards   sometimes up to thigh lateral     Pain Onset  More than a month ago    Pain Frequency  Constant    Aggravating Factors   walking, weightbearing     Pain Relieving Factors  OTC med, rest , braces (2 weeks )          [facility] 05[facility]18[facility]18 0001      Assessment   Medical Diagnosis  Rt knee pain     Referring Provider  Dr. Marsa Arisimothy Ryan Draper     Onset Date/Surgical Date  03/18/18 approx.     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       AROM   Right Knee Extension  4    Right Knee Flexion  125    Left Knee Extension  2    Left Knee Flexion  132      Strength   Right Hip Flexion  5/5    Right Hip Extension  4/5    Right Hip ABduction  4-/5    Left Hip Flexion  5/5    Left Hip Extension  4+/5    Left Hip ABduction  4/5    Right Knee Flexion  4/5    Right Knee Extension  4/5    Left Knee Flexion  4+/5    Left Knee Extension  4+/5      Flexibility   Hamstrings  90/90 test: Rt/Lt. 65/47 deg       Palpation   Patella mobility  normal, sore distal to patella     Palpation comment  sore,  tender patella fat pad Rt                 Objective measurements completed on examination: See above findings.      [facility] Adult PT Treatment/Exercise - 05/18/18 0001      Self-Care   Self-Care  Heat/Ice Application;Other Self-Care Comments    Heat/Ice Application  ice post ex     Other Self-Care Comments   HEP, eval findings, hip weakness       Knee/Hip Exercises: Stretches   Active Hamstring Stretch  Both;3 reps      Knee/Hip Exercises: Supine   Bridges  Strengthening;Both;1 set;10 reps    Straight Leg Raise with External Rotation  Strengthening;Both;1 set;10 reps      Knee/Hip Exercises: Sidelying   Hip ABduction  Strengthening;Both;1 set;10 reps      Cryotherapy   Number Minutes Cryotherapy  8 Minutes    Cryotherapy Location  Knee    Type of Cryotherapy  Ice pack             PT Education - 05/18/18 1242    Education Details  PT/POC, HEP, ice for pain control     Person(s) Educated  Patient    Methods  Explanation;Handout    Comprehension  Verbalized understanding;Returned demonstration          PT Long Term Goals - 05/18/18 1239      PT LONG TERM GOAL #1   Title  Patient will score FOTO to <40 % to demo functional improvement     Time  6    Period  Weeks    Status  New    Target Date  06/29/18      PT LONG TERM GOAL #2   Title  Pt will be I with HEP and gym program for continued fitness goals    Time  6    Period  Weeks    Status  New    Target Date  06/29/18      PT LONG TERM GOAL #3   Title  pt will be able to squat , do LE strengthening ex without knee pain     Time  6    Period  Weeks    Status  New    Target Date  06/29/18      PT LONG TERM GOAL #4   Title  Pt will be able to walk on treadmill for 30 min without increase in knee pain     Time  6    Period  Weeks    Status  New    Target Date  06/29/18      PT LONG TERM GOAL #5   Title  Pt will understand RICE, lifting mechanics to maximize progress     Time  6     Period  Weeks    Target Date  06/29/18             Plan - 05/18/18 1254    Clinical Impression Statement  Patient presents for low complexity eval of bilateral knee pain which began about 2 mos ago.  his Rt knee hurts more than the L.  He has bilateral hip and quad weakness, hamstring tightness and weak core muscles.  He has pain (mos to severe ) with his normal daily activities, including cardio/fitness. He has since stopped for fear of exacerbating pain but would like to continue to exercise to maintain his weight loss.      Clinical Presentation  Stable    Clinical Decision Making  Low    Rehab Potential  Excellent    PT Frequency  2x / week    PT Duration  6 weeks    PT Treatment/Interventions  ADLs/Self Care Home Management;Cryotherapy;Ultrasound;Therapeutic exercise;Balance training;Functional mobility training;Neuromuscular re-education;Passive range of motion;Manual techniques;Patient/family education;Dry needling;Taping;Electrical Stimulation    PT Next Visit Plan  check HEP, consider tape to quads     PT Home Exercise Plan  SLR for flexion.VMO, Hip abd, bridge and hamstring stretch     Consulted and Agree with Plan of Care  Patient       Patient will benefit from skilled therapeutic intervention in order to improve the following deficits and impairments:  Decreased mobility, Difficulty walking, Obesity, Pain, Postural dysfunction, Impaired UE functional use, Impaired flexibility, Decreased strength, Decreased range of motion  Visit Diagnosis: Acute pain of left knee  Acute pain of right knee  Stiffness of right knee, not elsewhere classified  Stiffness of left knee, not elsewhere classified  Difficulty in walking, not elsewhere classified     Problem List Patient Active Problem List   Diagnosis Date Noted  . 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 Right leg pain 04/28/2018  . Anxiety 02/06/2018  . Increased urinary frequency 01/20/2017  . Healthcare  maintenance 01/20/2017  . Polyuria 01/26/2016  . Depression 01/11/2014  . Genital herpes 04/02/2011  . ESSENTIAL HYPERTENSION, BENIGN 12/11/2010  . OBESITY, NOS 02/09/2007  . Allergic rhinitis 02/09/2007  . Asthma, mild intermittent 02/09/2007    Kaci Dillie 05/18/2018, 1:27 PM  [facility] [address] [place], [place], [address] Phone: [phone]   Fax:  [phone]  Name: Scott Patterson MRN: [id number] Date of Birth: [birth year]   [name], PT  05/18/18 1:27 PM Phone: [phone] Fax: [phone]

## 2018-05-18 NOTE — Progress Notes (Signed)
   Subjective:    Patient ID: Scott Patterson, male    DOB: 1986/05/18, 32 y.o.   MRN: [id number]  HPI Pt presents in f/u for B knee pain. When previously seen his R knee was primarily bothering him. He has been wearing a Body Helix sleeve and now wears it all the time when awake. He finds it helpful and would like one for his L knee also. He has also seen his PCP for this and been to the ER twice including last Saturday due to pain. He was prescribed Naprosyn for his pain which he was taking at noon typically. It helped but he is now out. He has been previously prescribed Mobic which helped also but he felt the naprosyn worked better. He feels like B knees are swollen and they can feel this way first thing when he gets up in the morning or after walking so nothing in particular seems to trigger this. He works as a Conservator, museum/galleryBail Bondsman and is one his feet a lot and walking. He normally wears "fashion sneakers." Over the past year or so he has lost 90lbs going from 320lbs to 230lbs. He has been running 30 minutes on the treadmill and then lifting for 30 minutes 5-6 days a week. He reports his pain is 8/10. He feels like he "can't really walk."    Review of Systems As above    Objective:   Physical Exam Seated on exam table in NAD wearing a R knee Body Helix sleeve Minimally TTP B over quadriceps and patellar tendons. NL ROM B knees NL strength B knees. Negative anterior and posterior drawer signs B knees are stable, no crepitus, clicking, popping or locking NVI.  Outside R knee XRs reviewed and they show minimal arthritis medially.      Assessment & Plan:  Mild B quadriceps and patellar tendonitis Mild knee DJD  Pt feels he has gotten significant benefit from the R knee Body Helix sleeve. Will give a L knee sleeve. He has been doing a lot of physical activity prior to stopping due to pain 1.5 weeks ago. Advised to gradually start with lifting twice a week then advance activity as tolerated. He has  not really been doing his rehab exercises but is open to formal P.T.. Will set him up for outpt P.T.. Rx Naprosyn; D/C Mobic Ice 20 minutes to sore areas after work and after workouts. F/u in office in 1 month. Consider cortisone injection vs further diagnostic imaging if no improvement.

## 2018-05-22 ENCOUNTER — Ambulatory Visit: Payer: Self-pay | Admitting: Sports Medicine

## 2018-05-24 ENCOUNTER — Encounter: Payer: Self-pay | Admitting: Physical Therapy

## 2018-05-24 ENCOUNTER — Ambulatory Visit: Payer: BLUE CROSS/BLUE SHIELD | Admitting: Physical Therapy

## 2018-05-24 DIAGNOSIS — M25562 Pain in left knee: Secondary | ICD-10-CM | POA: Diagnosis not present

## 2018-05-24 DIAGNOSIS — M25662 Stiffness of left knee, not elsewhere classified: Secondary | ICD-10-CM

## 2018-05-24 DIAGNOSIS — M25661 Stiffness of right knee, not elsewhere classified: Secondary | ICD-10-CM

## 2018-05-24 DIAGNOSIS — R262 Difficulty in walking, not elsewhere classified: Secondary | ICD-10-CM

## 2018-05-24 DIAGNOSIS — M25561 Pain in right knee: Secondary | ICD-10-CM

## 2018-05-24 NOTE — Therapy (Signed)
[facility] [address] [place], [place], [address] Phone: [phone]   Fax:  [phone]  Physical Therapy Treatment  Patient Details  Name: Scott Patterson MRN: [id number] Date of Birth: 01-31-1986 Referring Provider: Dr. Marsa Aris    Encounter Date: 05/24/2018  PT End of Session - 05/24/18 1310    Visit Number  2    Number of Visits  12    Date for PT Re-Evaluation  06/29/18    PT Start Time  1303    PT Stop Time  1356    PT Time Calculation (min)  53 min    Activity Tolerance  Patient tolerated treatment well    Behavior During Therapy  [facility] for tasks assessed/performed       Past Medical History:  Diagnosis Date  . Anxiety   . Asthma   . Depression   . Hypertension     Past Surgical History:  Procedure Laterality Date  . None      There were no vitals filed for this visit.  Subjective Assessment - 05/24/18 1309    Subjective  Patient complains of pain today in both knees, 9/10.  Has been really bad the last few days. Really hard to stand, walk.  Bought some ice packs. He went to walk in clinic today, got some Prednisone and csome cream but can't remember the name.     Currently in Pain?  Yes    Pain Score  9     Pain Location  Knee    Pain Orientation  Right;Left;Anterior    Pain Descriptors / Indicators  Other (Comment);Sore    Pain Type  Acute pain    Pain Onset  More than a month ago    Pain Frequency  Intermittent    Aggravating Factors   walking, WB     Pain Relieving Factors  rest, ice                        [facility] Adult PT Treatment/Exercise - 05/24/18 0001      Knee/Hip Exercises: Stretches   Active Hamstring Stretch  Both;2 reps    ITB Stretch  Both;2 reps      Knee/Hip Exercises: Seated   Long Arc Quad  Strengthening;Both;1 set;15 reps      Knee/Hip Exercises: Supine   Quad Sets  Strengthening;Both;1 set;10 reps    Bridges  Strengthening;Both;1 set;10 reps    Straight Leg Raise with External Rotation  Strengthening;Both;1 set;10 reps      Knee/Hip Exercises: Sidelying   Hip ABduction  --      Cryotherapy   Number Minutes Cryotherapy  10 Minutes    Cryotherapy Location  Knee bilat.     Type of Cryotherapy  Ice pack      Manual Therapy   Manual Therapy  Taping    McConnell  pulled patella medially Rt knee     Kinesiotex  Facilitate Muscle      Kinesiotix   Facilitate Muscle   quads, bilateral 2 Ys                   PT Long Term Goals - 05/18/18 1239      PT LONG TERM GOAL #1   Title  Patient will score FOTO to <40 % to demo functional improvement     Time  6    Period  Weeks    Status  New    Target  Date  06/29/18      PT LONG TERM GOAL #2   Title  Pt will be I with HEP and gym program for continued fitness goals    Time  6    Period  Weeks    Status  New    Target Date  06/29/18      PT LONG TERM GOAL #3   Title  pt will be able to squat , do LE strengthening ex without knee pain     Time  6    Period  Weeks    Status  New    Target Date  06/29/18      PT LONG TERM GOAL #4   Title  Pt will be able to walk on treadmill for 30 min without increase in knee pain     Time  6    Period  Weeks    Status  New    Target Date  06/29/18      PT LONG TERM GOAL #5   Title  Pt will understand RICE, lifting mechanics to maximize progress     Time  6    Period  Weeks    Target Date  06/29/18            Plan - 05/24/18 1310    Clinical Impression Statement  Pt in increased pain today, trial of tape to activate quads and support knee.  Felt less pain, knees more"loose" as he walked out. Challenged with HEP today, limited by pain.  Lacks quad strength and control .  Pt is very depressed that he cannot do his normal routine, workouts.     PT Treatment/Interventions  ADLs/Self Care Home Management;Cryotherapy;Ultrasound;Therapeutic exercise;Balance training;Functional mobility training;Neuromuscular  re-education;Passive range of motion;Manual techniques;Patient/family education;Dry needling;Taping;Electrical Stimulation    PT Next Visit Plan  check HEP, consider tape to quads , advance strength, flexibility , try closed chain when able     PT Home Exercise Plan  SLR for flexion.VMO, Hip abd, bridge and hamstring stretch     Consulted and Agree with Plan of Care  Patient       Patient will benefit from skilled therapeutic intervention in order to improve the following deficits and impairments:  Decreased mobility, Difficulty walking, Obesity, Pain, Postural dysfunction, Impaired UE functional use, Impaired flexibility, Decreased strength, Decreased range of motion  Visit Diagnosis: Acute pain of left knee  Acute pain of right knee  Stiffness of right knee, not elsewhere classified  Stiffness of left knee, not elsewhere classified  Difficulty in walking, not elsewhere classified     Problem List Patient Active Problem List   Diagnosis Date Noted  . 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 Right leg pain 04/28/2018  . Anxiety 02/06/2018  . Increased urinary frequency 01/20/2017  . Healthcare maintenance 01/20/2017  . Polyuria 01/26/2016  . Depression 01/11/2014  . Genital herpes 04/02/2011  . ESSENTIAL HYPERTENSION, BENIGN 12/11/2010  . OBESITY, NOS 02/09/2007  . Allergic rhinitis 02/09/2007  . Asthma, mild intermittent 02/09/2007    Wakisha Alberts 05/24/2018, 3:31 PM  [facility] [address] [place], [place], [address] Phone: [phone]   Fax:  [phone]  Name: Nyra CapesRemus L Seebeck MRN: [id number] Date of Birth: 06-Sep-1986  Karie MainlandJennifer Amrit Cress, PT 05/24/18 3:31 PM Phone: [phone] Fax: [phone]

## 2018-05-25 ENCOUNTER — Ambulatory Visit: Payer: BLUE CROSS/BLUE SHIELD | Admitting: Physical Therapy

## 2018-05-25 ENCOUNTER — Encounter: Payer: Self-pay | Admitting: Physical Therapy

## 2018-05-25 DIAGNOSIS — M25662 Stiffness of left knee, not elsewhere classified: Secondary | ICD-10-CM

## 2018-05-25 DIAGNOSIS — M25562 Pain in left knee: Secondary | ICD-10-CM

## 2018-05-25 DIAGNOSIS — M25561 Pain in right knee: Secondary | ICD-10-CM

## 2018-05-25 DIAGNOSIS — R262 Difficulty in walking, not elsewhere classified: Secondary | ICD-10-CM

## 2018-05-25 DIAGNOSIS — M25661 Stiffness of right knee, not elsewhere classified: Secondary | ICD-10-CM

## 2018-05-25 NOTE — Therapy (Signed)
[facility] [address] [place], [place], [address] Phone: [phone]   Fax:  [phone]  Physical Therapy Treatment  Patient Details  Name: Scott CapesRemus L Llerenas MRN: [id number] Date of Birth: June 30, 1986 Referring Provider: Dr. Marsa Arisimothy Ryan Draper    Encounter Date: 05/25/2018  PT End of Session - 05/25/18 1134    Visit Number  3    Number of Visits  12    Date for PT Re-Evaluation  06/29/18    PT Start Time  1100    PT Stop Time  1153    PT Time Calculation (min)  53 min    Activity Tolerance  Patient tolerated treatment well    Behavior During Therapy  [facility] for tasks assessed/performed       Past Medical History:  Diagnosis Date  . Anxiety   . Asthma   . Depression   . Hypertension     Past Surgical History:  Procedure Laterality Date  . None      There were no vitals filed for this visit.  Subjective Assessment - 05/25/18 1106    Subjective  Saw orthopedic last night Delbert Harness(Murphy Wainer) for a 2nd opinion.  He feels much better today.  Began taking Prednisone and had an injection in both knees.      Currently in Pain?  Yes    Pain Score  4     Pain Location  Knee    Pain Orientation  Right           [facility] Adult PT Treatment/Exercise - 05/25/18 0001      Knee/Hip Exercises: Aerobic   Stationary Bike  L1 , 5 min       Knee/Hip Exercises: Standing   Forward Step Up  Both;1 set;15 reps;Hand Hold: 2;Hand Hold: 1;Step Height: 6"    Forward Step Up Limitations  need UE     Functional Squat  1 set;10 reps      Knee/Hip Exercises: Seated   Long Arc Quad  Strengthening;Both;1 set;15 reps 5 lbs       Knee/Hip Exercises: Supine   Short Arc Quad Sets  Strengthening;Both;1 set;15 reps    Short Arc Quad Sets Limitations  2 lbs     Straight Leg Raises  Strengthening;Both;1 set;10 reps    Straight Leg Raise with External Rotation  Strengthening;Both;1 set;10 reps      Knee/Hip Exercises: Sidelying   Hip ABduction   Strengthening;Both;2 sets;10 reps    Clams  x 20       Cryotherapy   Number Minutes Cryotherapy  10 Minutes    Cryotherapy Location  Knee bilat.     Type of Cryotherapy  Ice pack             PT Education - 05/25/18 1134    Education Details  HEP, hip strength     Person(s) Educated  Patient    Methods  Explanation    Comprehension  Verbalized understanding;Returned demonstration          PT Long Term Goals - 05/18/18 1239      PT LONG TERM GOAL #1   Title  Patient will score FOTO to <40 % to demo functional improvement     Time  6    Period  Weeks    Status  New    Target Date  06/29/18      PT LONG TERM GOAL #2   Title  Pt will be I with HEP and gym program for  continued fitness goals    Time  6    Period  Weeks    Status  New    Target Date  06/29/18      PT LONG TERM GOAL #3   Title  pt will be able to squat , do LE strengthening ex without knee pain     Time  6    Period  Weeks    Status  New    Target Date  06/29/18      PT LONG TERM GOAL #4   Title  Pt will be able to walk on treadmill for 30 min without increase in knee pain     Time  6    Period  Weeks    Status  New    Target Date  06/29/18      PT LONG TERM GOAL #5   Title  Pt will understand RICE, lifting mechanics to maximize progress     Time  6    Period  Weeks    Target Date  06/29/18            Plan - 05/25/18 1123    Clinical Impression Statement  Definite improvement in gait, exercise tolerance today.  Felt the tape improved after last visit even before the injection and medicine. New braces are comfortable.  Has about 10-13 deg quad lag bilaterally.      PT Treatment/Interventions  ADLs/Self Care Home Management;Cryotherapy;Ultrasound;Therapeutic exercise;Balance training;Functional mobility training;Neuromuscular re-education;Passive range of motion;Manual techniques;Patient/family education;Dry needling;Taping;Electrical Stimulation    PT Next Visit Plan  check HEP,  consider tape to quads , advance strength, flexibility , try closed chain when able     PT Home Exercise Plan  SLR for flexion.VMO, Hip abd, bridge and hamstring stretch     Consulted and Agree with Plan of Care  Patient       Patient will benefit from skilled therapeutic intervention in order to improve the following deficits and impairments:  Decreased mobility, Difficulty walking, Obesity, Pain, Postural dysfunction, Impaired UE functional use, Impaired flexibility, Decreased strength, Decreased range of motion  Visit Diagnosis: Acute pain of left knee  Acute pain of right knee  Stiffness of right knee, not elsewhere classified  Stiffness of left knee, not elsewhere classified  Difficulty in walking, not elsewhere classified     Problem List Patient Active Problem List   Diagnosis Date Noted  . 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 Right leg pain 04/28/2018  . Anxiety 02/06/2018  . Increased urinary frequency 01/20/2017  . Healthcare maintenance 01/20/2017  . Polyuria 01/26/2016  . Depression 01/11/2014  . Genital herpes 04/02/2011  . ESSENTIAL HYPERTENSION, BENIGN 12/11/2010  . OBESITY, NOS 02/09/2007  . Allergic rhinitis 02/09/2007  . Asthma, mild intermittent 02/09/2007    [name] 05/25/2018, 11:42 AM  [facility] [address] [place], [place], [address] Phone: [phone]   Fax:  [phone]  Name: Scott Patterson MRN: [id number] Date of Birth: 1986-01-03  [name], PT 05/25/18 11:46 AM Phone: [phone] Fax: [phone]

## 2018-05-29 ENCOUNTER — Ambulatory Visit: Payer: BLUE CROSS/BLUE SHIELD | Admitting: Physical Therapy

## 2018-05-29 ENCOUNTER — Ambulatory Visit: Payer: BLUE CROSS/BLUE SHIELD | Admitting: Sports Medicine

## 2018-05-30 ENCOUNTER — Ambulatory Visit: Payer: BLUE CROSS/BLUE SHIELD | Admitting: Family Medicine

## 2018-05-31 ENCOUNTER — Ambulatory Visit: Payer: BLUE CROSS/BLUE SHIELD | Admitting: Physical Therapy

## 2018-05-31 ENCOUNTER — Encounter: Payer: Self-pay | Admitting: Physical Therapy

## 2018-05-31 DIAGNOSIS — M25562 Pain in left knee: Secondary | ICD-10-CM | POA: Diagnosis not present

## 2018-05-31 DIAGNOSIS — R262 Difficulty in walking, not elsewhere classified: Secondary | ICD-10-CM

## 2018-05-31 DIAGNOSIS — M25661 Stiffness of right knee, not elsewhere classified: Secondary | ICD-10-CM

## 2018-05-31 DIAGNOSIS — M25662 Stiffness of left knee, not elsewhere classified: Secondary | ICD-10-CM

## 2018-05-31 DIAGNOSIS — M25561 Pain in right knee: Secondary | ICD-10-CM

## 2018-05-31 NOTE — Therapy (Signed)
[facility] [address] [place], [place], [address] Phone: [phone]   Fax:  [phone]  Physical Therapy Treatment  Patient Details  Name: Scott Patterson MRN: [id number] Date of Birth: [birth year] Referring Provider: Dr. Marsa Aris    Encounter Date: 05/31/2018  PT End of Session - 05/31/18 1025    Visit Number  4    Number of Visits  12    Date for PT Re-Evaluation  06/29/18    PT Start Time  1015    PT Stop Time  1115    PT Time Calculation (min)  60 min       Past Medical History:  Diagnosis Date  . Anxiety   . Asthma   . Depression   . Hypertension     Past Surgical History:  Procedure Laterality Date  . None      There were no vitals filed for this visit.  Subjective Assessment - 05/31/18 1025    Subjective  Had more pain this weekend     Currently in Pain?  Yes    Pain Score  6     Pain Location  Knee    Pain Orientation  Right;Left    Pain Descriptors / Indicators  Throbbing    Aggravating Factors   just woke up hurting, walking, WC    Pain Relieving Factors  rest, ice                        [facility] Adult PT Treatment/Exercise - 05/31/18 0001      Knee/Hip Exercises: Stretches   Other Knee/Hip Stretches  slant board stretch x 60 sec       Knee/Hip Exercises: Aerobic   Stationary Bike  L3 , 5 min       Knee/Hip Exercises: Standing   Hip Flexion  10 reps;2 sets;Both    Hip Abduction  10 reps;2 sets;Right;Left    Forward Step Up  10 reps;Hand Hold: 1;Step Height: 6";Right;Left    Functional Squat  1 set;10 reps      Knee/Hip Exercises: Seated   Long Arc Quad  Strengthening;Both;1 set;15 reps 5 lbs       Knee/Hip Exercises: Supine   Bridges  20 reps    Straight Leg Raises  Strengthening;Both;1 set;10 reps    Straight Leg Raise with External Rotation  Strengthening;Both;1 set;10 reps      Cryotherapy   Number Minutes Cryotherapy  15 Minutes    Cryotherapy Location   Knee    Type of Cryotherapy  Ice pack                  PT Long Term Goals - 05/18/18 1239      PT LONG TERM GOAL #1   Title  Patient will score FOTO to <40 % to demo functional improvement     Time  6    Period  Weeks    Status  New    Target Date  06/29/18      PT LONG TERM GOAL #2   Title  Pt will be I with HEP and gym program for continued fitness goals    Time  6    Period  Weeks    Status  New    Target Date  06/29/18      PT LONG TERM GOAL #3   Title  pt will be able to squat , do LE strengthening ex without knee pain  Time  6    Period  Weeks    Status  New    Target Date  06/29/18      PT LONG TERM GOAL #4   Title  Pt will be able to walk on treadmill for 30 min without increase in knee pain     Time  6    Period  Weeks    Status  New    Target Date  06/29/18      PT LONG TERM GOAL #5   Title  Pt will understand RICE, lifting mechanics to maximize progress     Time  6    Period  Weeks    Target Date  06/29/18            Plan - 05/31/18 1057    Clinical Impression Statement  Pt reports more pain over the weekend. 6/10 today. Continued with strenghening as tolerated .Ice at end of session for pain relief.     PT Next Visit Plan  check HEP, consider tape to quads , advance strength, flexibility , try closed chain when able     PT Home Exercise Plan  SLR for flexion.VMO, Hip abd, bridge and hamstring stretch     Consulted and Agree with Plan of Care  Patient       Patient will benefit from skilled therapeutic intervention in order to improve the following deficits and impairments:  Decreased mobility, Difficulty walking, Obesity, Pain, Postural dysfunction, Impaired UE functional use, Impaired flexibility, Decreased strength, Decreased range of motion  Visit Diagnosis: Acute pain of left knee  Acute pain of right knee  Stiffness of right knee, not elsewhere classified  Stiffness of left knee, not elsewhere classified  Difficulty in  walking, not elsewhere classified     Problem List Patient Active Problem List   Diagnosis Date Noted  . 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 Right leg pain 04/28/2018  . Anxiety 02/06/2018  . Increased urinary frequency 01/20/2017  . Healthcare maintenance 01/20/2017  . Polyuria 01/26/2016  . Depression 01/11/2014  . Genital herpes 04/02/2011  . ESSENTIAL HYPERTENSION, BENIGN 12/11/2010  . OBESITY, NOS 02/09/2007  . Allergic rhinitis 02/09/2007  . Asthma, mild intermittent 02/09/2007    [name], Mulki Roesler McGee, PTA 05/31/2018, 10:59 AM  [facility] [address] [place], [place], [address] Phone: [phone]   Fax:  [phone]  Name: Scott Patterson MRN: [id number] Date of Birth: [birth year]

## 2018-06-01 ENCOUNTER — Ambulatory Visit: Payer: BLUE CROSS/BLUE SHIELD | Admitting: Internal Medicine

## 2018-06-01 ENCOUNTER — Encounter: Payer: Self-pay | Admitting: Family Medicine

## 2018-06-01 ENCOUNTER — Ambulatory Visit (INDEPENDENT_AMBULATORY_CARE_PROVIDER_SITE_OTHER): Payer: BLUE CROSS/BLUE SHIELD | Admitting: Family Medicine

## 2018-06-01 DIAGNOSIS — M25562 Pain in left knee: Secondary | ICD-10-CM

## 2018-06-01 DIAGNOSIS — M25561 Pain in right knee: Secondary | ICD-10-CM | POA: Diagnosis not present

## 2018-06-01 NOTE — Patient Instructions (Signed)
Your main issue currently is patellofemoral syndrome. 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 I would avoid deep squats, lunges, leg press, hills, a lot of stairs for next 4-6 weeks while you focus on the rehab. Walking on treadmill - start at 50% and do this every other day, increase as tolerated as we discussed. Cross train with swimming, cycling with low resistance, elliptical if needed. Straight leg raise, hip side raises, straight leg raises with foot turned outwards 3 sets of 10 once a day. Add ankle weight if these become too easy. Continue formal physical therapy. Correct foot breakdown with something like dr. Jari Sportsmanscholls active series, spencos, superfeet, or our green sports insoles. Avoid flat shoes, barefoot walking as much as possible. Icing 15 minutes at a time 3-4 times a day as needed. Tylenol or ibuprofen as needed for pain. Follow up with me in 6 weeks.

## 2018-06-01 NOTE — Assessment & Plan Note (Signed)
Patient symptoms and exam are consistent with patellofemoral syndrome bilaterally. Radiographs from 05/15/18 reviewed and show very minimal medial compartment arthritis. He likely also had IT band syndrome and less likely an osteoarthritis flare in the recent past but neither are causing his current pain. He is already in PT but we recommended that he ensure he is doing exercises to strengthen VMO which were shown today. He was educated on patellofemoral syndrome and reassured that he should be able to return to his exercise regimen at full capacity. He will continue Mobic OR ibuprofen, and icing as needed. All questions and concerns were answered to his satisfaction. He will follow up in 6 weeks.

## 2018-06-01 NOTE — Progress Notes (Signed)
Subjective:    Patient ID: Scott Patterson is a 32 y.o. male.  Chief Complaint: Bilateral leg pain  HPI:  Patient presents with a 6 week history of right knee pain and a 4 week history of left knee pain. The pain today is localized to the lateral edge of the patella bilaterally which he characterizes as achy. Pain is 2/10 while at rest bilaterally and can get to 8/10 on the left and 7/10 on the right with movement. He says his symptoms are worse in the morning upon awakening and in the afternoon after being at work where he walks a lot. Prior to his current symptoms, he had been running on a treadmill for 30 minutes about 5 times per week as well as working out with weights which allowed him to lose about 90 lbs in a little over a year. He denies any inciting injury or prior injuries to either knee. He says that he would occasionally notice some discomfort in right knee but awoke one morning 6 weeks ago in 6/10 pain.   He was evaluated by Dr. Margaretha Sheffield on 05/01/18 who believed him to have a combination of IT band syndrome and patellofemoral syndrome and recommended ibuprofen, icing, and home strengthening exercises. A right knee X-ray was obtained on 05/15/18 that exhibited minimal medial arthritis and he was again evaluated by Dr. Margaretha Sheffield on 05/18/28 for bilateral knee pain and given naproxen as well as PT. He states today that he also went to [facility] on 05/24/18 for a second opinion where he was told he had DJD and given bilateral cortisone injections as well as prednisone. He has had minimal improvement of his symptoms since the cortisone shots but believes PT has been helping. He is now taking meloxicam 15 mg every AM and 600-800 mg of ibuprofen every PM as well as icing whenever possible. Denies any locking or catching.   Past Medical History:  Diagnosis Date  . Anxiety   . Asthma   . Depression   . Hypertension     Current Outpatient Medications on File Prior to Visit   Medication Sig Dispense Refill  . albuterol (PROVENTIL HFA;VENTOLIN HFA) 108 (90 Base) MCG/ACT inhaler Inhale 2 puffs into the lungs every 6 (six) hours as needed for wheezing or shortness of breath. 1 Inhaler 4  . candesartan (ATACAND) 4 MG tablet Take 1 tablet (4 mg total) by mouth daily. 30 tablet 11  . cetirizine (ZYRTEC) 10 MG tablet Take 1 tablet (10 mg total) by mouth daily. 30 tablet 5  . cyclobenzaprine (FLEXERIL) 5 MG tablet Take 1 tablet (5 mg total) by mouth at bedtime. 15 tablet 0  . fluticasone (FLONASE) 50 MCG/ACT nasal spray Place 2 sprays into both nostrils daily. 16 g 2  . meloxicam (MOBIC) 15 MG tablet Take 1 tablet (15 mg total) by mouth daily. (Patient not taking: Reported on 05/18/2018) 20 tablet 0  . mirtazapine (REMERON) 15 MG tablet Take 1 tablet (15 mg total) by mouth at bedtime. 30 tablet 2  . mometasone (NASONEX) 50 MCG/ACT nasal spray Place 2 sprays into the nose daily. 17 g 12  . naproxen (NAPROSYN) 500 MG tablet Take 1 tablet (500 mg total) by mouth 2 (two) times daily as needed. 30 tablet 0  . naproxen (NAPROSYN) 500 MG tablet Take 1 tablet (500 mg total) by mouth 2 (two) times daily as needed. 40 tablet 0   No current facility-administered medications on file prior to visit.     Past  Surgical History:  Procedure Laterality Date  . None      No Known Allergies  Social History   Socioeconomic History  . Marital status: Single    Spouse name: Not on file  . Number of children: Not on file  . Years of education: Not on file  . Highest education level: Not on file  Occupational History  . Occupation: BOF    Comment: Mortgages  Social Needs  . Financial resource strain: Not on file  . Food insecurity:    Worry: Not on file    Inability: Not on file  . Transportation needs:    Medical: Not on file    Non-medical: Not on file  Tobacco Use  . Smoking status: Never Smoker  . Smokeless tobacco: Never Used  Substance and Sexual Activity  . Alcohol  use: No  . Drug use: No  . Sexual activity: Not on file  Lifestyle  . Physical activity:    Days per week: Not on file    Minutes per session: Not on file  . Stress: Not on file  Relationships  . Social connections:    Talks on phone: Not on file    Gets together: Not on file    Attends religious service: Not on file    Active member of club or organization: Not on file    Attends meetings of clubs or organizations: Not on file    Relationship status: Not on file  . Intimate partner violence:    Fear of current or ex partner: Not on file    Emotionally abused: Not on file    Physically abused: Not on file    Forced sexual activity: Not on file  Other Topics Concern  . Not on file  Social History Narrative   Lives alone.      Family History  Problem Relation Age of Onset  . Depression Mother   . Hypertension Mother   . Alcohol abuse Father   . Drug abuse Father   . Stroke Father   . Hypertension Father   . Alcohol abuse Paternal Uncle   . Hypertension Brother     BP 117/75   Pulse 84   Ht 6\' 2"  (1.88 m)   Wt 236 lb 6.4 oz (107.2 kg)   BMI 30.35 kg/m    ROS: Constitutional: Negative for fever, chills, diaphoresis. Skin: Negative for bruising, ecchymosis, erythema. MSK: See HPI above. Neuor: Negative for numbness, tingling.   Objective:  Physical Exam: Gen: NAD, comfortable in exam room  Right knee: No gross deformity, ecchymoses, effusion. Mild TTP along lateral patellar border. No medial or lateral joint line tenderness. FROM. Strength 5/5 without pain including hip abduction. VMO atrophy. Negative ant/post drawers. Negative valgus/varus testing. Negative lachmanns. Negative mcmurrays, apleys, patellar apprehension. Negative Thessaly. NV intact distally.   Left knee: No gross deformity, ecchymoses, swelling. Mild TTP along lateral patellar border. No medial or lateral joint line tenderness. FROM. Strength 5/5 without pain including hip abduction. VMO  atrophy. Negative ant/post drawers. Negative valgus/varus testing. Negative lachmanns. Negative mcmurrays, apleys, patellar apprehension. Negative Thessaly. NV intact distally.  Pes planus.  Assessment:  Patellofemoral syndrome, bilaterall (M22.2X1)    Plan:  Patient symptoms and exam are consistent with patellofemoral syndrome bilaterally. Radiographs from 05/15/18 reviewed and show very minimal medial compartment arthritis. He likely also had IT band syndrome and less likely an osteoarthritis flare in the recent past but neither are causing his current pain. He is already in PT  but we recommended that he ensure he is doing exercises to strengthen VMO which were shown today. He was educated on patellofemoral syndrome and reassured that he should be able to return to his exercise regimen at full capacity. He will continue Mobic OR ibuprofen, and icing as needed. All questions and concerns were answered to his satisfaction. He will follow up in 6 weeks.

## 2018-06-04 NOTE — Progress Notes (Deleted)
   Subjective:   Patient ID: Scott Patterson    DOB: 1986-11-01, 32 y.o. male   MRN: [id number]  Scott Patterson is a 32 y.o. male with a history of HTN, obesity, depression, anxiety here for   Wrist popping - ***  EXTREMITY PAIN  Pain is: *** Location: *** Pain started: *** Pain prevents: *** Medications tried: *** Recent trauma: *** Similar pain previously: ***  Symptoms Redness:*** Swelling:*** Fever: *** Weakness: *** Weight loss: *** Rash: ***  Review of Systems:  Per HPI.   PMFSH, medications and smoking status reviewed.  Objective:   There were no vitals taken for this visit. Vitals and nursing note reviewed.  General: well nourished, well developed, in no acute distress with non-toxic appearance HEENT: normocephalic, atraumatic, moist mucous membranes Neck: supple, non-tender without lymphadenopathy CV: regular rate and rhythm without murmurs, rubs, or gallops, no lower extremity edema Lungs: clear to auscultation bilaterally with normal work of breathing Abdomen: soft, non-tender, non-distended, no masses or organomegaly palpable, normoactive bowel sounds Skin: warm, dry, no rashes or lesions Extremities: warm and well perfused, normal tone MSK: ROM grossly intact, strength intact, gait normal Neuro: Alert and oriented, speech normal  Assessment & Plan:   No problem-specific Assessment & Plan notes found for this encounter.  No orders of the defined types were placed in this encounter.  No orders of the defined types were placed in this encounter.   Ellwood DenseAlison Rumball, DO PGY-1,  Family Medicine 06/04/2018 3:24 PM

## 2018-06-05 ENCOUNTER — Ambulatory Visit: Payer: BLUE CROSS/BLUE SHIELD | Admitting: Family Medicine

## 2018-06-05 ENCOUNTER — Ambulatory Visit: Payer: BLUE CROSS/BLUE SHIELD | Admitting: Physical Therapy

## 2018-06-05 ENCOUNTER — Encounter: Payer: Self-pay | Admitting: Physical Therapy

## 2018-06-05 DIAGNOSIS — R262 Difficulty in walking, not elsewhere classified: Secondary | ICD-10-CM

## 2018-06-05 DIAGNOSIS — M25562 Pain in left knee: Secondary | ICD-10-CM | POA: Diagnosis not present

## 2018-06-05 DIAGNOSIS — M25661 Stiffness of right knee, not elsewhere classified: Secondary | ICD-10-CM

## 2018-06-05 DIAGNOSIS — M25662 Stiffness of left knee, not elsewhere classified: Secondary | ICD-10-CM

## 2018-06-05 DIAGNOSIS — M25561 Pain in right knee: Secondary | ICD-10-CM

## 2018-06-05 NOTE — Progress Notes (Deleted)
   Subjective:   Patient ID: Scott Patterson    DOB: 1986-02-09, 32 y.o. male   MRN: [id number]  Scott Patterson is a 32 y.o. male with a history of obesity, HTN here for   Wrist popping - ***  Pain is: *** Location: *** Pain started: *** Pain prevents: *** Medications tried: *** Recent trauma: *** Similar pain previously: ***  Symptoms Redness:*** Swelling:*** Fever: *** Weakness: *** Weight loss: *** Rash: ***  Review of Systems:  Per HPI.   PMFSH, medications and smoking status reviewed.  Objective:   There were no vitals taken for this visit. Vitals and nursing note reviewed.  General: well nourished, well developed, in no acute distress with non-toxic appearance HEENT: normocephalic, atraumatic, moist mucous membranes Neck: supple, non-tender without lymphadenopathy CV: regular rate and rhythm without murmurs, rubs, or gallops, no lower extremity edema Lungs: clear to auscultation bilaterally with normal work of breathing Abdomen: soft, non-tender, non-distended, no masses or organomegaly palpable, normoactive bowel sounds Skin: warm, dry, no rashes or lesions Extremities: warm and well perfused, normal tone MSK: ROM grossly intact, strength intact, gait normal Neuro: Alert and oriented, speech normal  Assessment & Plan:   No problem-specific Assessment & Plan notes found for this encounter.  No orders of the defined types were placed in this encounter.  No orders of the defined types were placed in this encounter.   [name], DO PGY-1, [facility] 06/05/2018 9:25 PM

## 2018-06-05 NOTE — Therapy (Signed)
[facility] [address] [place], [place], [address] Phone: [phone]   Fax:  [phone]  Physical Therapy Treatment  Patient Details  Name: Scott Patterson MRN: [id number] Date of Birth: [birth year] Referring Provider: Dr. Marsa Aris    Encounter Date: 06/05/2018  PT End of Session - 06/05/18 0849    Visit Number  5    Number of Visits  12    Date for PT Re-Evaluation  06/29/18    PT Start Time  0847    PT Stop Time  0935    PT Time Calculation (min)  48 min       Past Medical History:  Diagnosis Date  . Anxiety   . Asthma   . Depression   . Hypertension     Past Surgical History:  Procedure Laterality Date  . None      There were no vitals filed for this visit.  Subjective Assessment - 06/05/18 0848    Subjective  No pain. knees are feeling better.     Currently in Pain?  No/denies                       [facility] Adult PT Treatment/Exercise - 06/05/18 0001      Knee/Hip Exercises: Stretches   Active Hamstring Stretch  Both;2 reps    Other Knee/Hip Stretches  slant board stretch x 60 sec       Knee/Hip Exercises: Aerobic   Stationary Bike  L3 , 5 min       Knee/Hip Exercises: Standing   Lateral Step Up  20 reps    Forward Step Up  20 reps;Hand Hold: 0;Step Height: 6"    Step Down  10 reps;Step Height: 6";Hand Hold: 1;Left;Right    Functional Squat  1 set;10 reps    Wall Squat  10 reps    SLS  >10 tosses with LOB, bilat SLS      Knee/Hip Exercises: Supine   Straight Leg Raises  Strengthening;Both;10 reps;2 sets 2#      Knee/Hip Exercises: Sidelying   Hip ABduction  Strengthening;Both;20 reps;1 set 2#      Knee/Hip Exercises: Prone   Hamstring Curl  20 reps 2#    Hip Extension  2 sets;10 reps;Right;Left 2#      Cryotherapy   Number Minutes Cryotherapy  15 Minutes    Cryotherapy Location  Knee    Type of Cryotherapy  Ice pack                  PT Long Term  Goals - 05/18/18 1239      PT LONG TERM GOAL #1   Title  Patient will score FOTO to <40 % to demo functional improvement     Time  6    Period  Weeks    Status  New    Target Date  06/29/18      PT LONG TERM GOAL #2   Title  Pt will be I with HEP and gym program for continued fitness goals    Time  6    Period  Weeks    Status  New    Target Date  06/29/18      PT LONG TERM GOAL #3   Title  pt will be able to squat , do LE strengthening ex without knee pain     Time  6    Period  Weeks    Status  New  Target Date  06/29/18      PT LONG TERM GOAL #4   Title  Pt will be able to walk on treadmill for 30 min without increase in knee pain     Time  6    Period  Weeks    Status  New    Target Date  06/29/18      PT LONG TERM GOAL #5   Title  Pt will understand RICE, lifting mechanics to maximize progress     Time  6    Period  Weeks    Target Date  06/29/18            Plan - 06/05/18 0919    Clinical Impression Statement  Less pain. Progressed therex wtih good tolerance. Decreased motor control with step down noted. Increase pain with squats.[name]. Ice post session per pt request.     PT Next Visit Plan  check HEP, consider tape to quads , advance strength, flexibility ,    PT Home Exercise Plan  SLR for flexion.VMO, Hip abd, bridge and hamstring stretch     Consulted and Agree with Plan of Care  Patient       Patient will benefit from skilled therapeutic intervention in order to improve the following deficits and impairments:  Decreased mobility, Difficulty walking, Obesity, Pain, Postural dysfunction, Impaired UE functional use, Impaired flexibility, Decreased strength, Decreased range of motion  Visit Diagnosis: Acute pain of left knee  Acute pain of right knee  Stiffness of right knee, not elsewhere classified  Stiffness of left knee, not elsewhere classified  Difficulty in walking, not elsewhere classified     Problem List Patient Active Problem List    Diagnosis Date Noted  . Bilateral knee pain 06/01/2018  . 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 Right leg pain 04/28/2018  . Anxiety 02/06/2018  . Increased urinary frequency 01/20/2017  . Healthcare maintenance 01/20/2017  . Polyuria 01/26/2016  . Depression 01/11/2014  . Genital herpes 04/02/2011  . ESSENTIAL HYPERTENSION, BENIGN 12/11/2010  . OBESITY, NOS 02/09/2007  . Allergic rhinitis 02/09/2007  . Asthma, mild intermittent 02/09/2007    [name], [name], PTA 06/05/2018, 9:26 AM  [facility] [address] [place], [place], [address] Phone: [phone]   Fax:  [phone]  Name: Scott Patterson MRN: [id number] Date of Birth: 1985/12/14

## 2018-06-06 ENCOUNTER — Ambulatory Visit: Payer: Self-pay | Admitting: Family Medicine

## 2018-06-07 ENCOUNTER — Encounter: Payer: Self-pay | Admitting: Physical Therapy

## 2018-06-07 ENCOUNTER — Ambulatory Visit: Payer: BLUE CROSS/BLUE SHIELD | Admitting: Physical Therapy

## 2018-06-07 DIAGNOSIS — M25562 Pain in left knee: Secondary | ICD-10-CM

## 2018-06-07 DIAGNOSIS — M25661 Stiffness of right knee, not elsewhere classified: Secondary | ICD-10-CM

## 2018-06-07 DIAGNOSIS — R262 Difficulty in walking, not elsewhere classified: Secondary | ICD-10-CM

## 2018-06-07 DIAGNOSIS — M25561 Pain in right knee: Secondary | ICD-10-CM

## 2018-06-07 DIAGNOSIS — M25662 Stiffness of left knee, not elsewhere classified: Secondary | ICD-10-CM

## 2018-06-07 NOTE — Therapy (Signed)
[facility] [address] [place], [place], [address] Phone: [phone]   Fax:  [phone]  Physical Therapy Treatment  Patient Details  Name: Scott Patterson MRN: [id number] Date of Birth: 11-30-1986 Referring Provider: Dr. Marsa Aris    Encounter Date: 06/07/2018  PT End of Session - 06/07/18 1007    Visit Number  6    Number of Visits  12    Date for PT Re-Evaluation  06/29/18    PT Start Time  0930    PT Stop Time  1020    PT Time Calculation (min)  50 min       Past Medical History:  Diagnosis Date  . Anxiety   . Asthma   . Depression   . Hypertension     Past Surgical History:  Procedure Laterality Date  . None      There were no vitals filed for this visit.  Subjective Assessment - 06/07/18 0933    Subjective  Going to MD about neck pain Monday. I need to move my appt here to a later time. My knees are bothering me on the outsides. The pain is not as bad, just irritating.     Currently in Pain?  Yes    Pain Score  5     Pain Location  Knee    Pain Orientation  Left;Right;Lateral    Pain Descriptors / Indicators  -- irritating    Pain Type  Acute pain    Aggravating Factors   moving too fast    Pain Relieving Factors  rest, ice          [facility] PT Assessment - 06/07/18 0001      AROM   Right Knee Flexion  132      Strength   Right Hip ABduction  4+/5      Flexibility   Hamstrings  90/90 test: Rt/Lt. 37/30 deg                    [facility] Adult PT Treatment/Exercise - 06/07/18 0001      Knee/Hip Exercises: Stretches   Active Hamstring Stretch  Both;2 reps    Other Knee/Hip Stretches  slant board stretch x 60 sec       Knee/Hip Exercises: Aerobic   Stationary Bike  L3 , 5 min       Knee/Hip Exercises: Standing   Forward Step Up  20 reps;Hand Hold: 0;Step Height: 6"    Step Down  Right;Left;10 reps;Hand Hold: 1;Step Height: 6"    SLS  >10 tosses without  LOB, bilat SLS    Other Standing Knee Exercises  BOSU step up x 15 each , 1 UE support     Other Standing Knee Exercises  Standing green band lateral squats 15 ft x 2 each way       Knee/Hip Exercises: Seated   Sit to Sand  10 reps cues to control descent       Knee/Hip Exercises: Supine   Bridges  20 reps    Single Leg Bridge  10 reps    Straight Leg Raises  Strengthening;Both;2 sets;20 reps 2#                  PT Long Term Goals - 05/18/18 1239      PT LONG TERM GOAL #1   Title  Patient will score FOTO to <40 % to demo functional improvement     Time  6  Period  Weeks    Status  New    Target Date  06/29/18      PT LONG TERM GOAL #2   Title  Pt will be I with HEP and gym program for continued fitness goals    Time  6    Period  Weeks    Status  New    Target Date  06/29/18      PT LONG TERM GOAL #3   Title  pt will be able to squat , do LE strengthening ex without knee pain     Time  6    Period  Weeks    Status  New    Target Date  06/29/18      PT LONG TERM GOAL #4   Title  Pt will be able to walk on treadmill for 30 min without increase in knee pain     Time  6    Period  Weeks    Status  New    Target Date  06/29/18      PT LONG TERM GOAL #5   Title  Pt will understand RICE, lifting mechanics to maximize progress     Time  6    Period  Weeks    Target Date  06/29/18            Plan - 06/07/18 1008    Clinical Impression Statement  Pt reports 60% overall improvement in pain. Progressed therex focusing on eccentric strengthening. He demonstrates improved hip strength and improved knee flexion ROM. Doing 15 minutes on T.M. for now and has soreness afterward.     PT Next Visit Plan  check HEP, consider tape to quads , advance strength, flexibility ,    PT Home Exercise Plan  SLR for flexion.VMO, Hip abd, bridge and hamstring stretch     Consulted and Agree with Plan of Care  Patient       Patient will benefit from skilled therapeutic intervention in order  to improve the following deficits and impairments:  Decreased mobility, Difficulty walking, Obesity, Pain, Postural dysfunction, Impaired UE functional use, Impaired flexibility, Decreased strength, Decreased range of motion  Visit Diagnosis: Acute pain of left knee  Acute pain of right knee  Stiffness of right knee, not elsewhere classified  Stiffness of left knee, not elsewhere classified  Difficulty in walking, not elsewhere classified     Problem List Patient Active Problem List   Diagnosis Date Noted  . Bilateral knee pain 06/01/2018  . 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 Right leg pain 04/28/2018  . Anxiety 02/06/2018  . Increased urinary frequency 01/20/2017  . Healthcare maintenance 01/20/2017  . Polyuria 01/26/2016  . Depression 01/11/2014  . Genital herpes 04/02/2011  . ESSENTIAL HYPERTENSION, BENIGN 12/11/2010  . OBESITY, NOS 02/09/2007  . Allergic rhinitis 02/09/2007  . Asthma, mild intermittent 02/09/2007    [name], Sarafina Puthoff McGee, PTA 06/07/2018, 10:13 AM  [facility] [address] [place], [place], [address] Phone: [phone]   Fax:  [phone]  Name: Nyra CapesRemus L Ram MRN: [id number] Date of Birth: 1986-03-02

## 2018-06-12 ENCOUNTER — Ambulatory Visit: Payer: BLUE CROSS/BLUE SHIELD | Attending: Sports Medicine | Admitting: Physical Therapy

## 2018-06-12 DIAGNOSIS — M25562 Pain in left knee: Secondary | ICD-10-CM | POA: Insufficient documentation

## 2018-06-12 DIAGNOSIS — M25561 Pain in right knee: Secondary | ICD-10-CM | POA: Insufficient documentation

## 2018-06-12 DIAGNOSIS — M25662 Stiffness of left knee, not elsewhere classified: Secondary | ICD-10-CM | POA: Diagnosis present

## 2018-06-12 DIAGNOSIS — M25661 Stiffness of right knee, not elsewhere classified: Secondary | ICD-10-CM

## 2018-06-12 DIAGNOSIS — R262 Difficulty in walking, not elsewhere classified: Secondary | ICD-10-CM | POA: Insufficient documentation

## 2018-06-12 NOTE — Therapy (Signed)
[place], [place], [address] Phone: [phone]   Fax:  [phone]  Physical Therapy Treatment  Patient Details  Name: Scott Patterson MRN: [id number] Date of Birth: [birth year] Referring Provider: Dr. Shellia Cleverly    Encounter Date: 06/12/2018  PT End of Session - 06/12/18 1150    Visit Number  7    Number of Visits  12    Date for PT Re-Evaluation  06/29/18    PT Start Time  1601    PT Stop Time  1247    PT Time Calculation (min)  56 min    Activity Tolerance  Patient tolerated treatment well       Past Medical History:  Diagnosis Date  . Anxiety   . Asthma   . Depression   . Hypertension     Past Surgical History:  Procedure Laterality Date  . None      There were no vitals filed for this visit.  Subjective Assessment - 06/12/18 1153    Subjective  Scott Patterson walked on the treadmill earlier this AM with an incline and he wonders if it is from walking on this. He was on it for about 33' not to fast.     Patient Stated Goals  Pt would like to get my knees stronger, cont to workout.     Currently in Pain?  Yes    Pain Score  5     Pain Location  Knee    Pain Orientation  Left;Right;Lateral    Pain Descriptors / Indicators  Aching;Dull    Pain Type  Acute pain    Pain Onset  More than a month ago    Pain Frequency  Intermittent    Aggravating Factors   possibly using treadmill with incline today.     Pain Relieving Factors  rest         [facility] PT Assessment - 06/12/18 0001      Assessment   Medical Diagnosis  Rt knee pain       Strength   Right Hip Flexion  4/5    Right Hip Extension  5/5    Right Hip ABduction  4+/5    Left Hip Flexion  5/5    Left Hip Extension  5/5    Left Hip ABduction  -- 5-/5    Right Knee Flexion  4+/5    Right Knee Extension  4+/5    Left Knee Flexion  5/5    Left Knee Extension  5/5                   [facility] Adult PT Treatment/Exercise  - 06/12/18 0001      Knee/Hip Exercises: Stretches   Active Hamstring Stretch  Both;30 seconds    Quad Stretch  Both;2 reps;30 seconds prone with strap    ITB Stretch  Both;2 reps;30 seconds cross body with strap      Knee/Hip Exercises: Aerobic   Nustep  L6x5'      Knee/Hip Exercises: Machines for Strengthening   Cybex Knee Flexion  3plates each side VC for form    Other Machine  cybews hip abduction 2 plates      Knee/Hip Exercises: Standing   SLS  SLS with FWD leans 2x10, VC for form    Other Standing Knee Exercises  2x10 single leg sit to stand using high mat table.       Modalities   Modalities  Cryotherapy  Cryotherapy   Number Minutes Cryotherapy  15 Minutes    Cryotherapy Location  Knee bilat    Type of Cryotherapy  Ice pack                  PT Long Term Goals - 06/12/18 1155      PT LONG TERM GOAL #1   Title  Patient will score FOTO to <40 % to demo functional improvement     Status  On-going            Plan - 06/12/18 1225    Clinical Impression Statement  Scott Patterson had some bilat knee pain today, he thinks it is because he was on the treadmill this AM with a high incline.  Recommend he not use inclines at this time.  He fatigued quickly with higher LE single leg strengthening exercise.  He has very tight hamstrings and ITBs.  Would benefit from continued treatment to advance stretngthening in preparation for running and improve flexibility. No new goals met at this time.     Rehab Potential  Excellent    PT Frequency  2x / week    PT Duration  6 weeks    PT Treatment/Interventions  ADLs/Self Care Home Management;Cryotherapy;Ultrasound;Therapeutic exercise;Balance training;Functional mobility training;Neuromuscular re-education;Passive range of motion;Manual techniques;Patient/family education;Dry needling;Taping;Electrical Stimulation    PT Next Visit Plan  assess response to new HEP       Patient will benefit from skilled therapeutic  intervention in order to improve the following deficits and impairments:  Decreased mobility, Difficulty walking, Obesity, Pain, Postural dysfunction, Impaired UE functional use, Impaired flexibility, Decreased strength, Decreased range of motion  Visit Diagnosis: Acute pain of left knee  Acute pain of right knee  Stiffness of right knee, not elsewhere classified  Stiffness of left knee, not elsewhere classified  Difficulty in walking, not elsewhere classified     Problem List Patient Active Problem List   Diagnosis Date Noted  . Bilateral knee pain 06/01/2018  . 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 Right leg pain 04/28/2018  . Anxiety 02/06/2018  . Increased urinary frequency 01/20/2017  . Healthcare maintenance 01/20/2017  . Polyuria 01/26/2016  . Depression 01/11/2014  . Genital herpes 04/02/2011  . ESSENTIAL HYPERTENSION, BENIGN 12/11/2010  . OBESITY, NOS 02/09/2007  . Allergic rhinitis 02/09/2007  . Asthma, mild intermittent 02/09/2007    Jeral Pinch PT  06/12/2018, 12:36 PM  [facility] [address] [place], [place], [address] Phone: [phone]   Fax:  [phone]  Name: Scott Patterson MRN: [id number] Date of Birth: [birth year]

## 2018-06-12 NOTE — Patient Instructions (Addendum)
Balance: Unilateral - Forward Lean    Stand on left foot, hands on hips. Keeping hips level, bend forward as if to touch forehead to wall. Hold __1-2_ seconds. Relax. Repeat __10-15__ times per set. Do __2-3__ sets per session. Do _4___ sessions per week. Repeat on the other side      Use hip machine at the gym -work on standing hip flexion starting with 35#, then standing hip abduction with 20#  Single leg sit to/from stands from high surface. Work your way lower as you get stronger.

## 2018-06-14 ENCOUNTER — Encounter: Payer: Self-pay | Admitting: Physical Therapy

## 2018-06-14 ENCOUNTER — Ambulatory Visit: Payer: BLUE CROSS/BLUE SHIELD | Admitting: Physical Therapy

## 2018-06-14 DIAGNOSIS — M25562 Pain in left knee: Secondary | ICD-10-CM | POA: Diagnosis not present

## 2018-06-14 DIAGNOSIS — R262 Difficulty in walking, not elsewhere classified: Secondary | ICD-10-CM

## 2018-06-14 DIAGNOSIS — M25661 Stiffness of right knee, not elsewhere classified: Secondary | ICD-10-CM

## 2018-06-14 DIAGNOSIS — M25662 Stiffness of left knee, not elsewhere classified: Secondary | ICD-10-CM

## 2018-06-14 DIAGNOSIS — M25561 Pain in right knee: Secondary | ICD-10-CM

## 2018-06-14 NOTE — Therapy (Signed)
[facility] Outpatient Rehabilitation [facility] [address] [place], [place], [address] Phone: [phone]   Fax:  [phone]  Physical Therapy Treatment  Patient Details  Name: Scott Patterson MRN: [id number] Date of Birth: 1986/07/30 Referring Provider: Dr. Marsa Aris    Encounter Date: 06/14/2018  PT End of Session - 06/14/18 0941    Visit Number  8    Number of Visits  12    PT Start Time  0930    PT Stop Time  1026    PT Time Calculation (min)  56 min    Activity Tolerance  Patient tolerated treatment well    Behavior During Therapy  [facility] for tasks assessed/performed       Past Medical History:  Diagnosis Date  . Anxiety   . Asthma   . Depression   . Hypertension     Past Surgical History:  Procedure Laterality Date  . None      There were no vitals filed for this visit.  Subjective Assessment - 06/14/18 0941    Subjective  Pain 2/10 today.  Doing better.            [facility] Adult PT Treatment/Exercise - 06/14/18 0001      Knee/Hip Exercises: Stretches   Active Hamstring Stretch  3 reps;30 seconds    ITB Stretch  Both;3 reps;30 seconds cross body with strap      Knee/Hip Exercises: Aerobic   Stationary Bike  L3, 6 min       Knee/Hip Exercises: Machines for Strengthening   Cybex Knee Extension  3 plates double leg x 15, single leg 2 plates       Knee/Hip Exercises: Standing   SLS  hip hinge x 10 used 0-1 UE assist     Other Standing Knee Exercises  2x10 single leg sit to stand using high mat table.       Knee/Hip Exercises: Supine   Bridges  20 reps    Straight Leg Raises  Strengthening;Both;1 set;15 reps    Straight Leg Raise with External Rotation  Strengthening;Both;1 set;15 reps      Cryotherapy   Number Minutes Cryotherapy  15 Minutes    Cryotherapy Location  Knee bilat    Type of Cryotherapy  Ice pack                  PT Long Term Goals - 06/14/18 6962      PT LONG TERM GOAL #1   Title  Patient will  score FOTO to <40 % to demo functional improvement     Baseline  59% FROM 65%      PT LONG TERM GOAL #2   Title  Pt will be I with HEP and gym program for continued fitness goals    Status  On-going      PT LONG TERM GOAL #3   Title  pt will be able to squat , do LE strengthening ex without knee pain     Baseline  sore , pain afterwards     Status  On-going      PT LONG TERM GOAL #4   Title  Pt will be able to walk on treadmill for 30 min without increase in knee pain     Status  On-going      PT LONG TERM GOAL #5   Title  Pt will understand RICE, lifting mechanics to maximize progress     Status  On-going  Plan - 06/14/18 0944    Clinical Impression Statement  Patient is improving, pain is much better and he is doing more physically.  Goals in progress. FOTO improved 6%.  May make more appts , sees MD 8/1    PT Treatment/Interventions  ADLs/Self Care Home Management;Cryotherapy;Ultrasound;Therapeutic exercise;Balance training;Functional mobility training;Neuromuscular re-education;Passive range of motion;Manual techniques;Patient/family education;Dry needling;Taping;Electrical Stimulation    PT Next Visit Plan  cont to strengthen, use wgt machines     PT Home Exercise Plan  SLR for flexion.VMO, Hip abd, bridge and hamstring stretch , sit to stand and single leg hip hinge     Consulted and Agree with Plan of Care  Patient       Patient will benefit from skilled therapeutic intervention in order to improve the following deficits and impairments:  Decreased mobility, Difficulty walking, Obesity, Pain, Postural dysfunction, Impaired UE functional use, Impaired flexibility, Decreased strength, Decreased range of motion  Visit Diagnosis: Acute pain of left knee  Acute pain of right knee  Stiffness of right knee, not elsewhere classified  Stiffness of left knee, not elsewhere classified  Difficulty in walking, not elsewhere classified     Problem List Patient  Active Problem List   Diagnosis Date Noted  . Bilateral knee pain 06/01/2018  . 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 Right leg pain 04/28/2018  . Anxiety 02/06/2018  . Increased urinary frequency 01/20/2017  . Healthcare maintenance 01/20/2017  . Polyuria 01/26/2016  . Depression 01/11/2014  . Genital herpes 04/02/2011  . ESSENTIAL HYPERTENSION, BENIGN 12/11/2010  . OBESITY, NOS 02/09/2007  . Allergic rhinitis 02/09/2007  . Asthma, mild intermittent 02/09/2007    Markesha Hannig 06/14/2018, 12:27 PM  [facility] [address] [place], [place], [address] Phone: [phone]   Fax:  [phone]  Name: Scott Patterson MRN: [id number] Date of Birth: 1986-05-28  Karie MainlandJennifer Pranish Akhavan, PT 06/14/18 12:27 PM Phone: [phone] Fax: [phone]

## 2018-06-19 ENCOUNTER — Ambulatory Visit: Payer: Self-pay | Admitting: Sports Medicine

## 2018-06-20 ENCOUNTER — Ambulatory Visit: Payer: BLUE CROSS/BLUE SHIELD | Admitting: Physical Therapy

## 2018-06-20 DIAGNOSIS — M25661 Stiffness of right knee, not elsewhere classified: Secondary | ICD-10-CM

## 2018-06-20 DIAGNOSIS — M25562 Pain in left knee: Secondary | ICD-10-CM | POA: Diagnosis not present

## 2018-06-20 DIAGNOSIS — R262 Difficulty in walking, not elsewhere classified: Secondary | ICD-10-CM

## 2018-06-20 DIAGNOSIS — M25561 Pain in right knee: Secondary | ICD-10-CM

## 2018-06-20 DIAGNOSIS — M25662 Stiffness of left knee, not elsewhere classified: Secondary | ICD-10-CM

## 2018-06-20 NOTE — Therapy (Addendum)
[facility] [address] [place], [place], [address] Phone: [phone]   Fax:  [phone]  Physical Therapy Treatment  Patient Details  Name: Scott Patterson MRN: [id number] Date of Birth: [birth year] Referring Provider: Dr. Marsa Arisimothy Ryan Draper    Encounter Date: 06/20/2018  PT End of Session - 06/20/18 1118    Visit Number  9    Number of Visits  12    Date for PT Re-Evaluation  06/29/18    PT Start Time  1102    PT Stop Time  1159    PT Time Calculation (min)  57 min    Activity Tolerance  Patient tolerated treatment well    Behavior During Therapy  [facility] for tasks assessed/performed       Past Medical History:  Diagnosis Date  . Anxiety   . Asthma   . Depression   . Hypertension     Past Surgical History:  Procedure Laterality Date  . None      There were no vitals filed for this visit.  Subjective Assessment - 06/20/18 1108    Subjective  Reports he had increased pain this past weekend. He is feeling okay today.    Currently in Pain?  Yes    Pain Score  6     Pain Location  Knee    Pain Orientation  Right;Left;Lateral    Pain Descriptors / Indicators  Sore                       [facility] Adult PT Treatment/Exercise - 06/20/18 0001      Knee/Hip Exercises: Stretches   Passive Hamstring Stretch  2 reps;30 seconds;Both    Quad Stretch  2 reps;30 seconds;Both prone with strap      Knee/Hip Exercises: Aerobic   Stationary Bike  L3, 5 min      Knee/Hip Exercises: Machines for Strengthening   Cybex Knee Extension  3 plates 2 x 10    Cybex Knee Flexion  4 plates 1 x 10    Total Gym Leg Press  3 plates double leg 3 x 10 (blue theraband around knees during 3rd set to promote correct knee alignment); 1 plate single leg bilaterally 1 x 10    Other Machine  cybex hip abduction and hip extension 2 plates x 10 bilaterally      Knee/Hip Exercises: Standing   SLS  hip hinge x 10 bilaterally; no UE assist       Knee/Hip Exercises: Supine   Bridges  AROM;Strengthening;2 sets;10 reps feet on physioball      Cryotherapy   Number Minutes Cryotherapy  10 Minutes    Cryotherapy Location  Knee bilaterally    Type of Cryotherapy  Ice pack      Manual Therapy   Manual Therapy  Taping    Kinesiotex  Facilitate Muscle      Kinesiotix   Facilitate Muscle   two Y pieces applied to R knee to faciliate quad             PT Education - 06/20/18 1337    Education Details  hip hinging     Person(s) Educated  Patient    Methods  Explanation    Comprehension  Verbalized understanding          PT Long Term Goals - 06/14/18 0942      PT LONG TERM GOAL #1   Title  Patient will score FOTO to <40 %  to demo functional improvement     Baseline  59% FROM 65%      PT LONG TERM GOAL #2   Title  Pt will be I with HEP and gym program for continued fitness goals    Status  On-going      PT LONG TERM GOAL #3   Title  pt will be able to squat , do LE strengthening ex without knee pain     Baseline  sore , pain afterwards     Status  On-going      PT LONG TERM GOAL #4   Title  Pt will be able to walk on treadmill for 30 min without increase in knee pain     Status  On-going      PT LONG TERM GOAL #5   Title  Pt will understand RICE, lifting mechanics to maximize progress     Status  On-going            Plan - 06/20/18 1118    Clinical Impression Statement  Pt reported increased pain over the weekend but is now feeling a little better. Treatment today focused on stretching and strengthening of hip and knee musculature. Pt reports some increased R knee pain at end of session and some muscle soreness.     PT Treatment/Interventions  ADLs/Self Care Home Management;Cryotherapy;Ultrasound;Therapeutic exercise;Balance training;Functional mobility training;Neuromuscular re-education;Passive range of motion;Manual techniques;Patient/family education;Dry needling;Taping;Electrical Stimulation     PT Next Visit Plan  ask about taping, stretching, strengthening with machines    PT Home Exercise Plan  SLR for flexion.VMO, Hip abd, bridge and hamstring stretch , sit to stand and single leg hip hinge     Consulted and Agree with Plan of Care  Patient       Patient will benefit from skilled therapeutic intervention in order to improve the following deficits and impairments:  Decreased mobility, Difficulty walking, Obesity, Pain, Postural dysfunction, Impaired UE functional use, Impaired flexibility, Decreased strength, Decreased range of motion  Visit Diagnosis: Acute pain of left knee  Acute pain of right knee  Stiffness of right knee, not elsewhere classified  Stiffness of left knee, not elsewhere classified  Difficulty in walking, not elsewhere classified     Problem List Patient Active Problem List   Diagnosis Date Noted  . Bilateral knee pain 06/01/2018  . 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 Right leg pain 04/28/2018  . Anxiety 02/06/2018  . Increased urinary frequency 01/20/2017  . Healthcare maintenance 01/20/2017  . Polyuria 01/26/2016  . Depression 01/11/2014  . Genital herpes 04/02/2011  . ESSENTIAL HYPERTENSION, BENIGN 12/11/2010  . OBESITY, NOS 02/09/2007  . Allergic rhinitis 02/09/2007  . Asthma, mild intermittent 02/09/2007    PAA,JENNIFER 06/20/2018, 1:38 PM  [facility] [address] [place], [place], [address] Phone: [phone]   Fax:  [phone]  Name: Scott Patterson MRN: [id number] Date of Birth: 09/04/1986  [name], PT 06/20/18 1:39 PM Phone: [phone] Fax: [phone]

## 2018-06-23 ENCOUNTER — Encounter: Payer: Self-pay | Admitting: Physical Therapy

## 2018-06-23 ENCOUNTER — Ambulatory Visit: Payer: BLUE CROSS/BLUE SHIELD | Admitting: Physical Therapy

## 2018-06-23 DIAGNOSIS — M25562 Pain in left knee: Secondary | ICD-10-CM | POA: Diagnosis not present

## 2018-06-23 DIAGNOSIS — M25662 Stiffness of left knee, not elsewhere classified: Secondary | ICD-10-CM

## 2018-06-23 DIAGNOSIS — M25661 Stiffness of right knee, not elsewhere classified: Secondary | ICD-10-CM

## 2018-06-23 DIAGNOSIS — M25561 Pain in right knee: Secondary | ICD-10-CM

## 2018-06-23 DIAGNOSIS — R262 Difficulty in walking, not elsewhere classified: Secondary | ICD-10-CM

## 2018-06-23 NOTE — Therapy (Signed)
[facility] [address] [place], [place], [address] Phone: [phone]   Fax:  [phone]  Physical Therapy Treatment  Patient Details  Name: Scott Patterson MRN: [id number] Date of Birth: 03-Oct-1986 Referring Provider: Dr. Marsa Aris    Encounter Date: 06/23/2018  PT End of Session - 06/23/18 1024    Visit Number  10    Number of Visits  12    Date for PT Re-Evaluation  06/29/18    PT Start Time  1018    PT Stop Time  1106    PT Time Calculation (min)  48 min       Past Medical History:  Diagnosis Date  . Anxiety   . Asthma   . Depression   . Hypertension     Past Surgical History:  Procedure Laterality Date  . None      There were no vitals filed for this visit.  Subjective Assessment - 06/23/18 1019    Subjective  Knees are "so so." I had to bring in grocerries and made 4 trips up and down 14 stairs with heavy grocerries. Do you think I need to use crutches on the stiars?     Currently in Pain?  Yes    Pain Score  7  5/10 right    Pain Location  Knee    Pain Orientation  Left;Right    Pain Descriptors / Indicators  Tightness    Aggravating Factors   stairs, carrying in groceries up the stairs     Pain Relieving Factors  rest                        [facility] Adult PT Treatment/Exercise - 06/23/18 0001      Knee/Hip Exercises: Stretches   Active Hamstring Stretch  3 reps;30 seconds    Hip Flexor Stretch  60 seconds;Both      Knee/Hip Exercises: Aerobic   Stationary Bike  L3, 5 min      Knee/Hip Exercises: Machines for Strengthening   Cybex Knee Extension  3 plates 2 x 10    Cybex Knee Flexion  4 plates 1 x 10    Cybex Leg Press  55# x 30 cues for slow control       Knee/Hip Exercises: Supine   Straight Leg Raises  Strengthening;Both;1 set;15 reps    Straight Leg Raise with External Rotation  Strengthening;Both;1 set;15 reps      Cryotherapy   Number Minutes Cryotherapy  10  Minutes    Cryotherapy Location  Knee bilaterally    Type of Cryotherapy  Ice pack                  PT Long Term Goals - 06/14/18 1324      PT LONG TERM GOAL #1   Title  Patient will score FOTO to <40 % to demo functional improvement     Baseline  59% FROM 65%      PT LONG TERM GOAL #2   Title  Pt will be I with HEP and gym program for continued fitness goals    Status  On-going      PT LONG TERM GOAL #3   Title  pt will be able to squat , do LE strengthening ex without knee pain     Baseline  sore , pain afterwards     Status  On-going      PT LONG TERM GOAL #4  Title  Pt will be able to walk on treadmill for 30 min without increase in knee pain     Status  On-going      PT LONG TERM GOAL #5   Title  Pt will understand RICE, lifting mechanics to maximize progress     Status  On-going            Plan - 06/23/18 1027    Clinical Impression Statement  Pt reports tape on right knee maybe helpful. He has increased left medial quad and knee pain today after carrying grocerries yesterday up stairs to apartment x 4 trips with heavy bags. Used gym equipment and avoided closed chain exercises today due to increased pain. Reviewed Stretches and performed soft tissue to left quad to relieve pain and soreness.     PT Next Visit Plan  ask about taping, stretching, strengthening with machines    PT Home Exercise Plan  SLR for flexion.VMO, Hip abd, bridge and hamstring stretch , sit to stand and single leg hip hinge     Consulted and Agree with Plan of Care  Patient       Patient will benefit from skilled therapeutic intervention in order to improve the following deficits and impairments:  Decreased mobility, Difficulty walking, Obesity, Pain, Postural dysfunction, Impaired UE functional use, Impaired flexibility, Decreased strength, Decreased range of motion  Visit Diagnosis: Acute pain of left knee  Acute pain of right knee  Stiffness of right knee, not elsewhere  classified  Stiffness of left knee, not elsewhere classified  Difficulty in walking, not elsewhere classified     Problem List Patient Active Problem List   Diagnosis Date Noted  . Bilateral knee pain 06/01/2018  . 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 Right leg pain 04/28/2018  . Anxiety 02/06/2018  . Increased urinary frequency 01/20/2017  . Healthcare maintenance 01/20/2017  . Polyuria 01/26/2016  . Depression 01/11/2014  . Genital herpes 04/02/2011  . ESSENTIAL HYPERTENSION, BENIGN 12/11/2010  . OBESITY, NOS 02/09/2007  . Allergic rhinitis 02/09/2007  . Asthma, mild intermittent 02/09/2007    [name], Jessica McGee, PTA 06/23/2018, 10:58 AM  [facility] [address] [place], [place], [address] Phone: [phone]   Fax:  [phone]  Name: Scott Patterson MRN: [id number] Date of Birth: 12/29/1985

## 2018-06-26 ENCOUNTER — Ambulatory Visit (INDEPENDENT_AMBULATORY_CARE_PROVIDER_SITE_OTHER): Payer: BLUE CROSS/BLUE SHIELD | Admitting: Family Medicine

## 2018-06-26 ENCOUNTER — Encounter: Payer: Self-pay | Admitting: Family Medicine

## 2018-06-26 DIAGNOSIS — M25561 Pain in right knee: Secondary | ICD-10-CM

## 2018-06-26 DIAGNOSIS — M25562 Pain in left knee: Secondary | ICD-10-CM | POA: Diagnosis not present

## 2018-06-26 NOTE — Patient Instructions (Signed)
You can hold your physical therapy for now. We will go ahead with an MRI of one of your knees. You can continue with home exercises in the meantime. Icing, tylenol, ibuprofen only if needed. We will contact you with the MRI results and next steps.

## 2018-06-27 ENCOUNTER — Encounter: Payer: Self-pay | Admitting: Family Medicine

## 2018-06-27 NOTE — Progress Notes (Signed)
PCP: Shirley, [place], DO  Subjective:   HPI: Patient is a 32 y.o. male here for bilateral knee pain.  6/20: Patient presents with a 6 week history of right knee pain and a 4 week history of left knee pain. The pain today is localized to the lateral edge of the patella bilaterally which he characterizes as achy. Pain is 2/10 while at rest bilaterally and can get to 8/10 on the left and 7/10 on the right with movement. He says his symptoms are worse in the morning upon awakening and in the afternoon after being at work where he walks a lot. Prior to his current symptoms, he had been running on a treadmill for 30 minutes about 5 times per week as well as working out with weights which allowed him to lose about 90 lbs in a little over a year. He denies any inciting injury or prior injuries to either knee. He says that he would occasionally notice some discomfort in right knee but awoke one morning 6 weeks ago in 6/10 pain.   He was evaluated by Dr. Margaretha Sheffieldraper on 05/01/18 who believed him to have a combination of IT band syndrome and patellofemoral syndrome and recommended ibuprofen, icing, and home strengthening exercises. A right knee X-ray was obtained on 05/15/18 that exhibited minimal medial arthritis and he was again evaluated by Dr. Margaretha Sheffieldraper on 05/18/28 for bilateral knee pain and given naproxen as well as PT. He states today that he also went to [facility] on 05/24/18 for a second opinion where he was told he had DJD and given bilateral cortisone injections as well as prednisone. He has had minimal improvement of his symptoms since the cortisone shots but believes PT has been helping. He is now taking meloxicam 15 mg every AM and 600-800 mg of ibuprofen every PM as well as icing whenever possible. Denies any locking or catching.   7/15: Patient reports his knees feel worse than last visit. He has completed 10 visits of PT and has been compliant with home exercises. Has been wearing  brace. Pain is 8/10 and sharp both knees, anterior. Pain felt medially and anterolaterally. No skin changes, numbness. No new injuries.  Past Medical History:  Diagnosis Date  . Anxiety   . Asthma   . Depression   . Hypertension     Current Outpatient Medications on File Prior to Visit  Medication Sig Dispense Refill  . albuterol (PROVENTIL HFA;VENTOLIN HFA) 108 (90 Base) MCG/ACT inhaler Inhale 2 puffs into the lungs every 6 (six) hours as needed for wheezing or shortness of breath. 1 Inhaler 4  . amLODipine (NORVASC) 5 MG tablet Take 5 mg by mouth daily.  2  . candesartan (ATACAND) 4 MG tablet Take 1 tablet (4 mg total) by mouth daily. 30 tablet 11  . cetirizine (ZYRTEC) 10 MG tablet Take 1 tablet (10 mg total) by mouth daily. 30 tablet 5  . cyclobenzaprine (FLEXERIL) 5 MG tablet Take 1 tablet (5 mg total) by mouth at bedtime. 15 tablet 0  . fluticasone (FLONASE) 50 MCG/ACT nasal spray Place 2 sprays into both nostrils daily. 16 g 2  . hydrochlorothiazide (HYDRODIURIL) 12.5 MG tablet Take 12.5 mg by mouth daily.  5   No current facility-administered medications on file prior to visit.     Past Surgical History:  Procedure Laterality Date  . None      No Known Allergies  Social History   Socioeconomic History  . Marital status: Single    Spouse  name: Not on file  . Number of children: Not on file  . Years of education: Not on file  . Highest education level: Not on file  Occupational History  . Occupation: BOF    Comment: Mortgages  Social Needs  . Financial resource strain: Not on file  . Food insecurity:    Worry: Not on file    Inability: Not on file  . Transportation needs:    Medical: Not on file    Non-medical: Not on file  Tobacco Use  . Smoking status: Never Smoker  . Smokeless tobacco: Never Used  Substance and Sexual Activity  . Alcohol use: No  . Drug use: No  . Sexual activity: Not on file  Lifestyle  . Physical activity:    Days per week: Not  on file    Minutes per session: Not on file  . Stress: Not on file  Relationships  . Social connections:    Talks on phone: Not on file    Gets together: Not on file    Attends religious service: Not on file    Active member of club or organization: Not on file    Attends meetings of clubs or organizations: Not on file    Relationship status: Not on file  . Intimate partner violence:    Fear of current or ex partner: Not on file    Emotionally abused: Not on file    Physically abused: Not on file    Forced sexual activity: Not on file  Other Topics Concern  . Not on file  Social History Narrative   Lives alone.      Family History  Problem Relation Age of Onset  . Depression Mother   . Hypertension Mother   . Alcohol abuse Father   . Drug abuse Father   . Stroke Father   . Hypertension Father   . Alcohol abuse Paternal Uncle   . Hypertension Brother     BP 128/87   Pulse 80   Ht 6\' 2"  (1.88 m)   Wt 240 lb (108.9 kg)   BMI 30.81 kg/m   Review of Systems: See HPI above.     Objective:  Physical Exam:  Gen: NAD, comfortable in exam room  Right knee: No gross deformity, ecchymoses, effusion. TTP medial and lateral joint lines, less post patellar facets. FROM with 5/5 strength. Negative ant/post drawers. Negative valgus/varus testing. Negative lachmanns. Negative mcmurrays, apleys, patellar apprehension. NV intact distally.  Left knee: No gross deformity, ecchymoses, effusion. TTP medial and lateral joint lines, less post patellar facets. FROM with 5/5 strength. Negative ant/post drawers. Negative valgus/varus testing. Negative lachmanns. Negative mcmurrays, apleys, patellar apprehension. NV intact distally.   Assessment & Plan:  1. Bilateral knee pain - patient's exam is reassuring aside from tenderness.  He unfortunately has not improved with extensive physical therapy, bracing, cortisone injections, naproxen, meloxicam, ibuprofen.  Advised at this point  we go ahead with an MRI of one of his knees (they are similar in location, level of pain, and exam) to further assess for possible meniscus tear or other pathology to account for his pain.  Hold PT in meantime.  Icing, tylenol, ibuprofen if needed.

## 2018-06-27 NOTE — Addendum Note (Signed)
Addended by: Kathi SimpersWISE, Anthony Roland F on: 06/27/2018 09:59 AM   Modules accepted: Orders

## 2018-06-27 NOTE — Assessment & Plan Note (Signed)
patient's exam is reassuring aside from tenderness.  He unfortunately has not improved with extensive physical therapy, bracing, cortisone injections, naproxen, meloxicam, ibuprofen.  Advised at this point we go ahead with an MRI of one of his knees (they are similar in location, level of pain, and exam) to further assess for possible meniscus tear or other pathology to account for his pain.  Hold PT in meantime.  Icing, tylenol, ibuprofen if needed.

## 2018-07-01 ENCOUNTER — Other Ambulatory Visit: Payer: Self-pay

## 2018-07-05 ENCOUNTER — Ambulatory Visit: Payer: BLUE CROSS/BLUE SHIELD | Admitting: Physical Therapy

## 2018-07-05 ENCOUNTER — Telehealth: Payer: Self-pay | Admitting: Physical Therapy

## 2018-07-05 NOTE — Telephone Encounter (Signed)
Spoke to patient regarding no-show to appointment this morning. He asked to cancel his next 2 weeks of appointments until after MRI.

## 2018-07-07 ENCOUNTER — Encounter: Payer: Self-pay | Admitting: Physical Therapy

## 2018-07-10 ENCOUNTER — Encounter: Payer: Self-pay | Admitting: Physical Therapy

## 2018-07-10 ENCOUNTER — Ambulatory Visit
Admission: RE | Admit: 2018-07-10 | Discharge: 2018-07-10 | Disposition: A | Discharge status: Home / Self Care | Payer: BLUE CROSS/BLUE SHIELD | Source: Ambulatory Visit | Attending: Family Medicine | Admitting: Family Medicine

## 2018-07-10 DIAGNOSIS — M25562 Pain in left knee: Principal | ICD-10-CM

## 2018-07-10 DIAGNOSIS — M25561 Pain in right knee: Secondary | ICD-10-CM

## 2018-07-12 ENCOUNTER — Encounter: Payer: Self-pay | Admitting: Physical Therapy

## 2018-07-13 ENCOUNTER — Ambulatory Visit: Payer: BLUE CROSS/BLUE SHIELD | Admitting: Family Medicine

## 2018-07-18 ENCOUNTER — Ambulatory Visit: Payer: BLUE CROSS/BLUE SHIELD | Attending: Sports Medicine | Admitting: Physical Therapy

## 2018-07-18 ENCOUNTER — Ambulatory Visit: Payer: BLUE CROSS/BLUE SHIELD | Admitting: Family Medicine

## 2018-07-18 ENCOUNTER — Encounter: Payer: Self-pay | Admitting: Physical Therapy

## 2018-07-18 DIAGNOSIS — M25561 Pain in right knee: Secondary | ICD-10-CM | POA: Insufficient documentation

## 2018-07-18 DIAGNOSIS — M25562 Pain in left knee: Secondary | ICD-10-CM

## 2018-07-18 DIAGNOSIS — M25662 Stiffness of left knee, not elsewhere classified: Secondary | ICD-10-CM | POA: Diagnosis present

## 2018-07-18 DIAGNOSIS — R262 Difficulty in walking, not elsewhere classified: Secondary | ICD-10-CM

## 2018-07-18 DIAGNOSIS — M25661 Stiffness of right knee, not elsewhere classified: Secondary | ICD-10-CM | POA: Diagnosis present

## 2018-07-18 NOTE — Therapy (Signed)
[place], [place], [address] Phone: [phone]   Fax:  [phone]  Physical Therapy Treatment/Renewal   Patient Details  Name: Scott Patterson MRN: [id number] Date of Birth: Apr 30, 1986 Referring Provider: Dr. Shellia Cleverly    Encounter Date: 07/18/2018  PT End of Session - 07/18/18 1210    Visit Number  11    Number of Visits  19    Date for PT Re-Evaluation  08/15/18    PT Start Time  0092    PT Stop Time  1230    PT Time Calculation (min)  45 min    Activity Tolerance  Patient tolerated treatment well    Behavior During Therapy  [facility] for tasks assessed/performed       Past Medical History:  Diagnosis Date  . Anxiety   . Asthma   . Depression   . Hypertension     Past Surgical History:  Procedure Laterality Date  . None      There were no vitals filed for this visit.  Subjective Assessment - 07/18/18 1151    Subjective  My knees feel better.  Its only because of the injection.  MRI was normal.     How long can you walk comfortably?  1 hour     Diagnostic tests  XR done shpowed mild arthiris , MRI normal     Patient Stated Goals  Pt would like to get my knees stronger, cont to workout.     Currently in Pain?  Yes    Pain Score  3     Pain Location  Knee    Pain Orientation  Left;Right;Anterior;Lateral    Pain Descriptors / Indicators  Aching;Tightness;Tingling    Pain Type  Chronic pain    Pain Onset  More than a month ago    Pain Frequency  Intermittent    Aggravating Factors   stairs, walking,lifting, running , got back to the gym this week (not running)     Pain Relieving Factors  rest, ice     Effect of Pain on Daily Activities  does not want to gain weight          [facility] PT Assessment - 07/18/18 0001      Strength   Overall Strength Comments  barely felt a VMO contraction despite cues     Right Hip Flexion  4/5    Left Hip Flexion  4+/5    Right Knee Flexion  4+/5    Right Knee Extension  4+/5    Left Knee Flexion  4+/5    Left Knee Extension  4+/5        [facility] Adult PT Treatment/Exercise - 07/18/18 0001      Self-Care   Other Self-Care Comments   VMO, weakness contributing , see ed.       Knee/Hip Exercises: Machines for Strengthening   Cybex Knee Extension  20 lbs 1 x 15 reps bilat in neutral and then x 15 with toes out.  Cues to fully extend knee and pause for end range       Knee/Hip Exercises: Supine   Straight Leg Raise with External Rotation  Strengthening;Right;Left;1 set;15 reps      Knee/Hip Exercises: Sidelying   Hip ABduction  Strengthening;Both;1 set;15 reps    Hip ADduction  Strengthening;Both;1 set;15 reps    Clams  x 20       Cryotherapy   Number Minutes Cryotherapy  5 Minutes  Cryotherapy Location  Knee    Type of Cryotherapy  Ice pack         PT Education - 07/18/18 1210    Education Details  VMO, renewal, knee ext machine in gym     Person(s) Educated  Patient    Methods  Explanation;Handout    Comprehension  Verbalized understanding;Returned demonstration          PT Long Term Goals - 07/18/18 1211      PT LONG TERM GOAL #1   Title  Patient will score FOTO to <40 % to demo functional improvement       PT LONG TERM GOAL #2   Title  Pt will be I with HEP and gym program for continued fitness goals    Baseline  up to date     Status  Partially Met      PT LONG TERM GOAL #3   Title  pt will be able to squat , do LE strengthening ex without knee pain     Status  On-going      PT LONG TERM GOAL #4   Title  Pt will be able to walk on treadmill for 30 min without increase in knee pain     Status  On-going      PT LONG TERM GOAL #5   Title  Pt will understand RICE, lifting mechanics to maximize progress     Status  On-going            Plan - 07/18/18 1211    Clinical Impression Statement  Patient seen today for renewal.  He missed his MD appt this AM ([place]) he did not realize he had an appt.   He was renewed today for 1 more month of PT.  He has very weak VMO and hip abd, adductor which is likely contributing to PF pain. He does not work his LE in [employer] but he was told today that is was safe to do so with light wgts (leg press) and knee extension, lighter wgts.  Did 20 lbs today with fatigue.     Clinical Presentation  Stable    Clinical Decision Making  Low    PT Frequency  2x / week    PT Duration  4 weeks    PT Treatment/Interventions  ADLs/Self Care Home Management;Cryotherapy;Ultrasound;Therapeutic exercise;Balance training;Functional mobility training;Neuromuscular re-education;Passive range of motion;Manual techniques;Patient/family education;Dry needling;Taping;Electrical Stimulation    PT Next Visit Plan  FOTO (forgot to do today! ) knee ext, VMO and hip strength , elliptical     PT Home Exercise Plan  SLR for flexion.VMO, Hip abd, bridge and hamstring stretch , sit to stand and single leg hip hinge , VMO, hip adduction     Consulted and Agree with Plan of Care  Patient       Patient will benefit from skilled therapeutic intervention in order to improve the following deficits and impairments:  Decreased mobility, Difficulty walking, Obesity, Pain, Postural dysfunction, Impaired UE functional use, Impaired flexibility, Decreased strength, Decreased range of motion  Visit Diagnosis: Acute pain of left knee  Acute pain of right knee  Stiffness of right knee, not elsewhere classified  Stiffness of left knee, not elsewhere classified  Difficulty in walking, not elsewhere classified     Problem List Patient Active Problem List   Diagnosis Date Noted  . Bilateral knee pain 06/01/2018  . 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 Right leg pain 04/28/2018  . Anxiety 02/06/2018  . Increased  urinary frequency 01/20/2017  . Healthcare maintenance 01/20/2017  . Polyuria 01/26/2016  . Depression 01/11/2014  . Genital herpes 04/02/2011  . ESSENTIAL HYPERTENSION, BENIGN  12/11/2010  . OBESITY, NOS 02/09/2007  . Allergic rhinitis 02/09/2007  . Asthma, mild intermittent 02/09/2007    Meliah Appleman 07/18/2018, 12:40 PM  [facility] [address] [place], [place], [address] Phone: [phone]   Fax:  [phone]  Name: ASHUR GLATFELTER MRN: [id number] Date of Birth: Jan 13, 1986  Raeford Razor, PT 07/18/18 12:40 PM Phone: [phone] Fax: [phone]

## 2018-07-18 NOTE — Patient Instructions (Addendum)
ADDUCTION: Side-Lying (Active)    Lie on right side, with top leg bent and in front of other leg. Lift straight leg up as high as possible.  Complete __2_ sets of __10-20_ repetitions. Perform __2-3_ sessions per day.  http://gtsc.exer.us/128   Copyright  VHI. All rights reserved.     Straight Leg Raise: With External Leg Rotation    Lie on back with right leg straight, opposite leg bent. Rotate straight leg out and lift __6-10__ inches. Repeat __10-20__ times per set. Do _2___ sets per session. Do __2-3__ sessions per day.  http://orth.exer.us/728   Copyright  VHI. All rights reserved.

## 2018-07-20 ENCOUNTER — Ambulatory Visit: Payer: BLUE CROSS/BLUE SHIELD | Admitting: Physical Therapy

## 2018-07-20 ENCOUNTER — Encounter: Payer: Self-pay | Admitting: Physical Therapy

## 2018-07-20 DIAGNOSIS — M25562 Pain in left knee: Secondary | ICD-10-CM | POA: Diagnosis not present

## 2018-07-20 DIAGNOSIS — M25662 Stiffness of left knee, not elsewhere classified: Secondary | ICD-10-CM

## 2018-07-20 DIAGNOSIS — M25661 Stiffness of right knee, not elsewhere classified: Secondary | ICD-10-CM

## 2018-07-20 DIAGNOSIS — M25561 Pain in right knee: Secondary | ICD-10-CM

## 2018-07-20 DIAGNOSIS — R262 Difficulty in walking, not elsewhere classified: Secondary | ICD-10-CM

## 2018-07-20 NOTE — Therapy (Addendum)
[place], [place], [address] Phone: [phone]   Fax:  [phone]  Physical Therapy Treatment/Addended Discharge   Patient Details  Name: Scott Patterson MRN: [id number] Date of Birth: 1986-01-03 Referring Provider: Dr. Shellia Cleverly    Encounter Date: 07/20/2018  PT End of Session - 07/20/18 1042    Visit Number  12    Number of Visits  19    Date for PT Re-Evaluation  08/15/18    PT Start Time  1017    PT Stop Time  1046    PT Time Calculation (min)  29 min    Activity Tolerance  Patient tolerated treatment well    Behavior During Therapy  [facility] for tasks assessed/performed       Past Medical History:  Diagnosis Date  . Anxiety   . Asthma   . Depression   . Hypertension     Past Surgical History:  Procedure Laterality Date  . None      There were no vitals filed for this visit.  Subjective Assessment - 07/20/18 1020    Subjective  I feel good today, maybe its the shot?     Currently in Pain?  No/denies                       [facility] Adult PT Treatment/Exercise - 07/20/18 0001      Knee/Hip Exercises: Stretches   Other Knee/Hip Stretches  pirifomis 30 sec x 1 each leg       Knee/Hip Exercises: Aerobic   Stationary Bike  L2 , 6 min       Knee/Hip Exercises: Standing   Lateral Step Up  Both;1 set;10 reps;Hand Hold: 0;Step Height: 6"    Forward Step Up  Both;2 sets;10 reps;Hand Hold: 0;Step Height: 6"    Step Down  Both;1 set;10 reps;Hand Hold: 0;Step Height: 4"    Functional Squat  1 set;15 reps      Knee/Hip Exercises: Seated   Sit to Sand  15 reps;without UE support      Knee/Hip Exercises: Supine   Bridges  Strengthening;Both;1 set;10 reps      Knee/Hip Exercises: Sidelying   Hip ABduction  Strengthening;Both;1 set;15 reps    Hip ADduction  Strengthening;Both;1 set;15 reps    Clams  x 20              PT Education - 07/20/18 1042    Education  Details  no                  Plan - 07/20/18 1043    Clinical Impression Statement  Patient seen for abreviated session, has a Rhematology appt across town.  Maximized time by working with blue resistance band and closed chain for hip strengthening, core. No increased pain.     PT Next Visit Plan  FOTO (forgot to do today! ) knee ext, VMO and hip strength , elliptical     PT Home Exercise Plan  SLR for flexion.VMO, Hip abd, bridge and hamstring stretch , sit to stand and single leg hip hinge , VMO, hip adduction     Consulted and Agree with Plan of Care  Patient       Patient will benefit from skilled therapeutic intervention in order to improve the following deficits and impairments:  Decreased mobility, Difficulty walking, Obesity, Pain, Postural dysfunction, Impaired UE functional use, Impaired flexibility, Decreased strength, Decreased range of motion  Visit  Diagnosis: Acute pain of left knee  Acute pain of right knee  Stiffness of right knee, not elsewhere classified  Stiffness of left knee, not elsewhere classified  Difficulty in walking, not elsewhere classified     Problem List Patient Active Problem List   Diagnosis Date Noted  . Bilateral knee pain 06/01/2018  . 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 Right leg pain 04/28/2018  . Anxiety 02/06/2018  . Increased urinary frequency 01/20/2017  . Healthcare maintenance 01/20/2017  . Polyuria 01/26/2016  . Depression 01/11/2014  . Genital herpes 04/02/2011  . ESSENTIAL HYPERTENSION, BENIGN 12/11/2010  . OBESITY, NOS 02/09/2007  . Allergic rhinitis 02/09/2007  . Asthma, mild intermittent 02/09/2007    PAA,JENNIFER 07/20/2018, 10:47 AM  [facility] [address] [place], [place], [address] Phone: [phone]   Fax:  [phone]  Name: Scott Patterson MRN: [id number] Date of Birth: 12-12-1986  Raeford Razor, PT 07/20/18 10:47 AM Phone:  [phone] Fax: [phone]   PHYSICAL THERAPY DISCHARGE SUMMARY  Visits from Start of Care: 12  Current functional level related to goals / functional outcomes: See above for most recent info    Remaining deficits: Unknown as of this date.  See above    Education / Equipment: HEP, RICE, Gym ex  Plan: Patient agrees to discharge.  Patient goals were partially met. Patient is being discharged due to not returning since the last visit.  ?????

## 2018-07-28 ENCOUNTER — Encounter

## 2018-08-01 ENCOUNTER — Ambulatory Visit: Payer: BLUE CROSS/BLUE SHIELD | Admitting: Physical Therapy

## 2018-08-03 ENCOUNTER — Ambulatory Visit: Payer: BLUE CROSS/BLUE SHIELD | Admitting: Physical Therapy

## 2018-08-03 ENCOUNTER — Telehealth: Payer: Self-pay | Admitting: Physical Therapy

## 2018-08-03 NOTE — Telephone Encounter (Signed)
Patient no showed last 2 appts.  Per our cancellation policy I cancelled the remainder of his appts.  He will need a new referral if he wants to return.  I was unable to leave a voicemail.   Karie MainlandJennifer Paa, PT 08/03/18 12:01 PM Phone: [phone] Fax: [phone]

## 2018-08-08 ENCOUNTER — Ambulatory Visit: Payer: BLUE CROSS/BLUE SHIELD | Admitting: Physical Therapy

## 2018-08-10 ENCOUNTER — Ambulatory Visit: Payer: BLUE CROSS/BLUE SHIELD | Admitting: Physical Therapy

## 2018-08-15 ENCOUNTER — Ambulatory Visit: Payer: BLUE CROSS/BLUE SHIELD | Admitting: Physical Therapy

## 2018-08-17 ENCOUNTER — Encounter: Payer: Self-pay | Admitting: Physical Therapy

## 2018-08-18 ENCOUNTER — Ambulatory Visit: Payer: BLUE CROSS/BLUE SHIELD | Admitting: Family Medicine

## 2018-09-25 ENCOUNTER — Other Ambulatory Visit: Payer: Self-pay | Admitting: Family Medicine

## 2018-09-27 ENCOUNTER — Emergency Department (HOSPITAL_BASED_OUTPATIENT_CLINIC_OR_DEPARTMENT_OTHER)
Admission: EM | Admit: 2018-09-27 | Discharge: 2018-09-27 | Disposition: A | Discharge status: Home / Self Care | Payer: BLUE CROSS/BLUE SHIELD | Attending: Emergency Medicine | Admitting: Emergency Medicine

## 2018-09-27 ENCOUNTER — Encounter (HOSPITAL_BASED_OUTPATIENT_CLINIC_OR_DEPARTMENT_OTHER): Payer: Self-pay | Admitting: *Deleted

## 2018-09-27 ENCOUNTER — Other Ambulatory Visit: Payer: Self-pay

## 2018-09-27 DIAGNOSIS — I10 Essential (primary) hypertension: Secondary | ICD-10-CM | POA: Insufficient documentation

## 2018-09-27 DIAGNOSIS — Z79899 Other long term (current) drug therapy: Secondary | ICD-10-CM | POA: Insufficient documentation

## 2018-09-27 DIAGNOSIS — J45909 Unspecified asthma, uncomplicated: Secondary | ICD-10-CM | POA: Diagnosis not present

## 2018-09-27 DIAGNOSIS — G43009 Migraine without aura, not intractable, without status migrainosus: Secondary | ICD-10-CM | POA: Insufficient documentation

## 2018-09-27 DIAGNOSIS — R51 Headache: Secondary | ICD-10-CM | POA: Diagnosis present

## 2018-09-27 MED ORDER — PROCHLORPERAZINE MALEATE 10 MG PO TABS: 10.0000 mg | ORAL_TABLET | Freq: Once | ORAL | 0 refills | Status: DC

## 2018-09-27 MED ORDER — DEXAMETHASONE 2 MG PO TABS: 6.0000 mg | ORAL_TABLET | Freq: Two times a day (BID) | ORAL | 0 refills | Status: AC

## 2018-09-27 NOTE — ED Triage Notes (Signed)
Pt c/o h/a and increased BP x 4 days. Pt states increased stress

## 2018-09-27 NOTE — ED Notes (Signed)
NAD at this time. Pt is stable and going home.  

## 2018-09-27 NOTE — ED Provider Notes (Signed)
[facility] EMERGENCY DEPARTMENT Provider Note   CSN: [id number] Arrival date & time: 09/27/18  1420     History   Chief Complaint Chief Complaint  Patient presents with  . Headache    HPI Scott Patterson is a 32 y.o. male.  The history is provided by the patient.  Headache   This is a new problem. The current episode started more than 2 days ago. The problem occurs every few hours. The problem has been gradually worsening. The headache is associated with emotional stress. The pain is located in the right unilateral region. The quality of the pain is described as dull. The pain is at a severity of 2/10. The pain is mild. The pain does not radiate. Pertinent negatives include no anorexia, no fever, no malaise/fatigue, no chest pressure, no near-syncope, no palpitations, no shortness of breath, no nausea and no vomiting. He has tried NSAIDs for the symptoms. The treatment provided mild relief.    Past Medical History:  Diagnosis Date  . Anxiety   . Asthma   . Depression   . Hypertension     Patient Active Problem List   Diagnosis Date Noted  . Bilateral knee pain 06/01/2018  . 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 Right leg pain 04/28/2018  . Anxiety 02/06/2018  . Increased urinary frequency 01/20/2017  . Healthcare maintenance 01/20/2017  . Polyuria 01/26/2016  . Depression 01/11/2014  . Genital herpes 04/02/2011  . ESSENTIAL HYPERTENSION, BENIGN 12/11/2010  . OBESITY, NOS 02/09/2007  . Allergic rhinitis 02/09/2007  . Asthma, mild intermittent 02/09/2007    Past Surgical History:  Procedure Laterality Date  . None          Home Medications    Prior to Admission medications   Medication Sig Start Date End Date Taking? Authorizing Provider  albuterol (PROVENTIL HFA;VENTOLIN HFA) 108 (90 Base) MCG/ACT inhaler Inhale 2 puffs into the lungs every 6 (six) hours as needed for wheezing or shortness of breath. 01/20/17   Haney, Arlyss Repress A, MD  amLODipine  (NORVASC) 5 MG tablet Take 5 mg by mouth daily. 06/05/18   [provider]  candesartan (ATACAND) 4 MG tablet Take 1 tablet (4 mg total) by mouth daily. 04/28/18   Lennox Solders, MD  cetirizine (ZYRTEC) 10 MG tablet Take 1 tablet (10 mg total) by mouth daily. 01/20/17   Haney, Jeanann Lewandowsky, MD  dexamethasone (DECADRON) 2 MG tablet Take 3 tablets (6 mg total) by mouth 2 (two) times daily with a meal for 1 dose. 09/27/18 09/28/18  Kazuma Elena, DO  fluticasone (FLONASE) 50 MCG/ACT nasal spray USE 2 SPRAY(S) IN EACH NOSTRIL ONCE DAILY 09/25/18   Myrene Buddy, MD  hydrochlorothiazide (HYDRODIURIL) 12.5 MG tablet Take 12.5 mg by mouth daily. 06/14/18   [provider]  prochlorperazine (COMPAZINE) 10 MG tablet Take 1 tablet (10 mg total) by mouth once for 1 dose. 09/27/18 09/27/18  Virgina Norfolk, DO    Family History Family History  Problem Relation Age of Onset  . Depression Mother   . Hypertension Mother   . Alcohol abuse Father   . Drug abuse Father   . Stroke Father   . Hypertension Father   . Alcohol abuse Paternal Uncle   . Hypertension Brother     Social History Social History   Tobacco Use  . Smoking status: Never Smoker  . Smokeless tobacco: Never Used  Substance Use Topics  . Alcohol use: No  . Drug use: No  Allergies   Patient has no known allergies.   Review of Systems Review of Systems  Constitutional: Negative for chills, fever and malaise/fatigue.  HENT: Negative for ear pain and sore throat.   Eyes: Negative for pain and visual disturbance.  Respiratory: Negative for cough and shortness of breath.   Cardiovascular: Negative for chest pain, palpitations and near-syncope.  Gastrointestinal: Negative for abdominal pain, anorexia, nausea and vomiting.  Genitourinary: Negative for dysuria and hematuria.  Musculoskeletal: Negative for arthralgias and back pain.  Skin: Negative for color change and rash.  Neurological: Positive for headaches.  Negative for seizures and syncope.  All other systems reviewed and are negative.    Physical Exam Updated Vital Signs  ED Triage Vitals [09/27/18 1423]  Enc Vitals Group     BP 137/88     Pulse Rate 79     Resp 16     Temp 98.5 F (36.9 C)     Temp Source Oral     SpO2 100 %     Weight 250 lb (113.4 kg)     Height 6\' 2"  (1.88 m)     Head Circumference      Peak Flow      Pain Score 9     Pain Loc      Pain Edu?      Excl. in GC?     Physical Exam  Constitutional: He is oriented to person, place, and time. He appears well-developed and well-nourished.  HENT:  Head: Normocephalic and atraumatic.  Eyes: Pupils are equal, round, and reactive to light. Conjunctivae and EOM are normal. Right pupil is round and reactive. Left pupil is round and reactive.  Neck: Neck supple.  Cardiovascular: Normal rate and regular rhythm.  No murmur heard. Pulmonary/Chest: Effort normal and breath sounds normal. No respiratory distress.  Abdominal: Soft. There is no tenderness.  Musculoskeletal: He exhibits no edema.  Neurological: He is alert and oriented to person, place, and time. He has normal strength. He is not disoriented. No cranial nerve deficit or sensory deficit. Coordination and gait normal.  5+/5 strength, normal sensation   Skin: Skin is warm and dry.  Psychiatric: He has a normal mood and affect.  Nursing note and vitals reviewed.    ED Treatments / Results  Labs (all labs ordered are listed, but only abnormal results are displayed) Labs Reviewed - No data to display  EKG None  Radiology No results found.  Procedures Procedures (including critical care time)  Medications Ordered in ED Medications - No data to display   Initial Impression / Assessment and Plan / ED Course  I have reviewed the triage vital signs and the nursing notes.  Pertinent labs & imaging results that were available during my care of the patient were reviewed by me and considered in my  medical decision making (see chart for details).     Scott Patterson is a 32 year old male with history of hypertension who presents to the ED with headaches.  Patient with unremarkable vitals.  No fever.  Patient with mild headache for the last several days.  Gradual at onset.  Patient has history of headaches.  Has some photophobia.  Denies any neurological symptoms.  States that his blood pressure has been higher than normal.  Patient has normal neurological exam.  No concern for intracranial process.  Normal gait.  Likely mild headache.  Has not really taken Tylenol Motrin.  Consistent with his prior headaches.  No concern for infectious process  and no signs to suggest meningitis.  Patient is overall well-appearing.  He did drive here himself and thus will prescribe a dose of Decadron and Compazine to take at home.  Recommend also Tylenol, Motrin, Benadryl when he gets home as well.  Patient was dischargedfrom ED in good condition and told to return to ED if symptoms worsen.  Recommend follow-up with primary care doctor to discuss blood pressure medications.   Final Clinical Impressions(s) / ED Diagnoses   Final diagnoses:  Migraine without aura and without status migrainosus, not intractable    ED Discharge Orders         Ordered    dexamethasone (DECADRON) 2 MG tablet  2 times daily with meals     09/27/18 1547    prochlorperazine (COMPAZINE) 10 MG tablet   Once     09/27/18 1547           Broderick Fonseca, DO 09/27/18 1626

## 2018-09-29 ENCOUNTER — Encounter (HOSPITAL_COMMUNITY): Payer: Self-pay | Admitting: *Deleted

## 2018-09-29 ENCOUNTER — Other Ambulatory Visit: Payer: Self-pay

## 2018-09-29 ENCOUNTER — Emergency Department (HOSPITAL_COMMUNITY)
Admission: EM | Admit: 2018-09-29 | Discharge: 2018-09-29 | Disposition: A | Discharge status: Home / Self Care | Payer: BLUE CROSS/BLUE SHIELD | Attending: Emergency Medicine | Admitting: Emergency Medicine

## 2018-09-29 ENCOUNTER — Emergency Department (HOSPITAL_COMMUNITY): Payer: BLUE CROSS/BLUE SHIELD

## 2018-09-29 DIAGNOSIS — Z5321 Procedure and treatment not carried out due to patient leaving prior to being seen by health care provider: Secondary | ICD-10-CM | POA: Insufficient documentation

## 2018-09-29 DIAGNOSIS — R079 Chest pain, unspecified: Secondary | ICD-10-CM | POA: Diagnosis present

## 2018-09-29 LAB — CBC
HEMATOCRIT: 48.2 % (ref 39.0–52.0)
Hemoglobin: 14.5 g/dL (ref 13.0–17.0)
MCH: 25.4 pg — ABNORMAL LOW (ref 26.0–34.0)
MCHC: 30.1 g/dL (ref 30.0–36.0)
MCV: 84.6 fL (ref 80.0–100.0)
Platelets: 163 10*3/uL (ref 150–400)
RBC: 5.7 MIL/uL (ref 4.22–5.81)
RDW: 13.8 % (ref 11.5–15.5)
WBC: 4.4 10*3/uL (ref 4.0–10.5)
nRBC: 0 % (ref 0.0–0.2)

## 2018-09-29 LAB — I-STAT TROPONIN, ED: Troponin i, poc: 0 ng/mL (ref 0.00–0.08)

## 2018-09-29 LAB — BASIC METABOLIC PANEL
Anion gap: 8 (ref 5–15)
BUN: 11 mg/dL (ref 6–20)
CALCIUM: 9.1 mg/dL (ref 8.9–10.3)
CO2: 25 mmol/L (ref 22–32)
CREATININE: 1.34 mg/dL — AB (ref 0.61–1.24)
Chloride: 105 mmol/L (ref 98–111)
GFR calc Af Amer: >60 mL/min (ref >60)
GFR calc non Af Amer: >60 mL/min (ref >60)
Glucose, Bld: 106 mg/dL — ABNORMAL HIGH (ref 70–99)
Potassium: 4.2 mmol/L (ref 3.5–5.1)
SODIUM: 138 mmol/L (ref 135–145)

## 2018-09-29 LAB — TROPONIN I: Troponin I: <0.03 ng/mL (ref <0.03)

## 2018-09-29 NOTE — ED Notes (Signed)
Results reviewed, no changes in acuity at this time 

## 2018-09-29 NOTE — ED Notes (Signed)
Called pt 3 x in lobby, no answer.  

## 2018-09-29 NOTE — ED Notes (Signed)
Patient called 3x by nurse and nurse tech with no answer

## 2018-09-29 NOTE — ED Triage Notes (Signed)
The pt arrived by [facility] from a drug store  The pt was here 4 days ago for hypertwenson an chest pain.  He was in the drug store and checked his bp  It was high  Pt very anxious when ems arrived the pt has calmed down some  bp decreased  Headache chest pain also   Ems gave baBY ASPIRIN 4 IV PER EMS

## 2018-10-28 ENCOUNTER — Other Ambulatory Visit: Payer: Self-pay

## 2018-10-28 ENCOUNTER — Emergency Department (HOSPITAL_BASED_OUTPATIENT_CLINIC_OR_DEPARTMENT_OTHER): Payer: BLUE CROSS/BLUE SHIELD

## 2018-10-28 ENCOUNTER — Emergency Department (HOSPITAL_BASED_OUTPATIENT_CLINIC_OR_DEPARTMENT_OTHER)
Admission: EM | Admit: 2018-10-28 | Discharge: 2018-10-28 | Disposition: A | Discharge status: Home / Self Care | Payer: BLUE CROSS/BLUE SHIELD | Attending: Emergency Medicine | Admitting: Emergency Medicine

## 2018-10-28 DIAGNOSIS — J45909 Unspecified asthma, uncomplicated: Secondary | ICD-10-CM | POA: Diagnosis not present

## 2018-10-28 DIAGNOSIS — R519 Headache, unspecified: Secondary | ICD-10-CM

## 2018-10-28 DIAGNOSIS — Z79899 Other long term (current) drug therapy: Secondary | ICD-10-CM | POA: Insufficient documentation

## 2018-10-28 DIAGNOSIS — R51 Headache: Secondary | ICD-10-CM | POA: Insufficient documentation

## 2018-10-28 MED ORDER — NAPROXEN 500 MG PO TABS: 500.0000 mg | ORAL_TABLET | Freq: Two times a day (BID) | ORAL | 0 refills | Status: DC

## 2018-10-28 MED ORDER — DIPHENHYDRAMINE HCL 25 MG PO TABS: 25.0000 mg | ORAL_TABLET | Freq: Four times a day (QID) | ORAL | 0 refills | Status: DC

## 2018-10-28 MED ORDER — KETOROLAC TROMETHAMINE 60 MG/2ML IM SOLN
60.0000 mg | Freq: Once | INTRAMUSCULAR | Status: AC
  Administered 2018-10-28: 60 mg via INTRAMUSCULAR
  Filled 2018-10-28: qty 2

## 2018-10-28 MED ORDER — METOCLOPRAMIDE HCL 5 MG/ML IJ SOLN
10.0000 mg | Freq: Once | INTRAMUSCULAR | Status: AC
  Administered 2018-10-28: 10 mg via INTRAMUSCULAR
  Filled 2018-10-28: qty 2

## 2018-10-28 MED ORDER — DIPHENHYDRAMINE HCL 50 MG/ML IJ SOLN
25.0000 mg | Freq: Once | INTRAMUSCULAR | Status: AC
  Administered 2018-10-28: 25 mg via INTRAMUSCULAR
  Filled 2018-10-28: qty 1

## 2018-10-28 MED ORDER — METOCLOPRAMIDE HCL 10 MG PO TABS: 10.0000 mg | ORAL_TABLET | Freq: Four times a day (QID) | ORAL | 0 refills | Status: DC

## 2018-10-28 NOTE — ED Provider Notes (Signed)
[facility] EMERGENCY DEPARTMENT Provider Note   CSN: [id number] Arrival date & time: 10/28/18  1037     History   Chief Complaint Chief Complaint  Patient presents with  . Headache    HPI Scott Patterson is a 32 y.o. male.  HPI Patient reports he has been getting headaches that is mostly present in the morning when he wakes up for several weeks.  He reports every day he has a throbbing headache on the sides of his head.  It alleviates a little bit during the course of the day.  He reports during the day however he feels somewhat dizzy.  Quality is lightheaded but not spinning.  Patient denies he is having any visual changes.  He is not experiencing photophobia, nausea or vomiting.  No gait incoordination.  No weakness or numbness of extremities.  He reports headache is made somewhat worse by bending over and causes increased pressure in his forehead and temples.  He has not had any fever or neck stiffness.  No significant nasal drainage or congestion.  No sore throat.  He reports he takes ibuprofen and get some mild temporary relief but the headache seems to come back.  Patient has had no recent injury or trauma. Past Medical History:  Diagnosis Date  . Anxiety   . Asthma   . Depression   . Hypertension     Patient Active Problem List   Diagnosis Date Noted  . Bilateral knee pain 06/01/2018  . 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 Right leg pain 04/28/2018  . Anxiety 02/06/2018  . Increased urinary frequency 01/20/2017  . Healthcare maintenance 01/20/2017  . Polyuria 01/26/2016  . Depression 01/11/2014  . Genital herpes 04/02/2011  . ESSENTIAL HYPERTENSION, BENIGN 12/11/2010  . OBESITY, NOS 02/09/2007  . Allergic rhinitis 02/09/2007  . Asthma, mild intermittent 02/09/2007    Past Surgical History:  Procedure Laterality Date  . None          Home Medications    Prior to Admission medications   Medication Sig Start Date End Date Taking? Authorizing  Provider  albuterol (PROVENTIL HFA;VENTOLIN HFA) 108 (90 Base) MCG/ACT inhaler Inhale 2 puffs into the lungs every 6 (six) hours as needed for wheezing or shortness of breath. 01/20/17   Haney, Arlyss Repress A, MD  amLODipine (NORVASC) 5 MG tablet Take 5 mg by mouth daily. 06/05/18   [provider]  candesartan (ATACAND) 4 MG tablet Take 1 tablet (4 mg total) by mouth daily. 04/28/18   Lennox Solders, MD  cetirizine (ZYRTEC) 10 MG tablet Take 1 tablet (10 mg total) by mouth daily. 01/20/17   Bonney Aid, MD  diphenhydrAMINE (BENADRYL) 25 MG tablet Take 1 tablet (25 mg total) by mouth every 6 (six) hours. 10/28/18   Arby Barrette, MD  fluticasone Aleda Grana) 50 MCG/ACT nasal spray USE 2 SPRAY(S) IN EACH NOSTRIL ONCE DAILY 09/25/18   Myrene Buddy, MD  hydrochlorothiazide (HYDRODIURIL) 12.5 MG tablet Take 12.5 mg by mouth daily. 06/14/18   [provider]  metoCLOPramide (REGLAN) 10 MG tablet Take 1 tablet (10 mg total) by mouth every 6 (six) hours. 1 tablet every 6-8 hours with Benadryl for headache. 10/28/18   Arby Barrette, MD  naproxen (NAPROSYN) 500 MG tablet Take 1 tablet (500 mg total) by mouth 2 (two) times daily. 10/28/18   Arby Barrette, MD  prochlorperazine (COMPAZINE) 10 MG tablet Take 1 tablet (10 mg total) by mouth once for 1 dose. 09/27/18 09/27/18  Virgina Norfolk,  DO    Family History Family History  Problem Relation Age of Onset  . Depression Mother   . Hypertension Mother   . Alcohol abuse Father   . Drug abuse Father   . Stroke Father   . Hypertension Father   . Alcohol abuse Paternal Uncle   . Hypertension Brother     Social History Social History   Tobacco Use  . Smoking status: Never Smoker  . Smokeless tobacco: Never Used  Substance Use Topics  . Alcohol use: No  . Drug use: No     Allergies   Patient has no known allergies.   Review of Systems Review of Systems 10 Systems reviewed and are negative for acute change except as noted in  the HPI.   Physical Exam Updated Vital Signs BP 131/76 (BP Location: Right Arm)   Pulse 71   Temp 98.1 F (36.7 C) (Oral)   Resp 15   Ht 6\' 2"  (1.88 m)   Wt 111.1 kg   SpO2 100%   BMI 31.46 kg/m   Physical Exam  Constitutional: He is oriented to person, place, and time.  Patient is alert and nontoxic.  He is clinically well in appearance.  Mental status is clear.  HENT:  Head is normocephalic atraumatic.  Patient has shaved scalp and is normal in appearance.  Skin condition is very good.  He endorses tenderness along the parietal aspects of the temples bilaterally.  No palpable anomalies.  No facial swelling.  Nares are patent without bogginess or drainage.  Oral cavity: Mucous membranes are pink and moist.  Dentition is in very good condition.  Posterior oropharynx is widely patent.  Bilateral TMs are normal.  Eyes: Pupils are equal, round, and reactive to light. Conjunctivae and EOM are normal.  Fundus exam shows normal optic disc.  Neck: Neck supple.  No lymphadenopathy no meningismus.  Cardiovascular: Normal rate, regular rhythm, normal heart sounds and intact distal pulses.  Pulmonary/Chest: Effort normal and breath sounds normal.  Abdominal: Soft. He exhibits no distension. There is no tenderness. There is no guarding.  Musculoskeletal: Normal range of motion. He exhibits no edema or tenderness.  Neurological: He is alert and oriented to person, place, and time. No cranial nerve deficit or sensory deficit. He exhibits normal muscle tone. Coordination normal.  Normal finger-nose exam bilaterally.  Normal heel shin exam.  Normal cognitive function.  Speech is normal.  Skin: Skin is warm and dry.  Psychiatric: He has a normal mood and affect.     ED Treatments / Results  Labs (all labs ordered are listed, but only abnormal results are displayed) Labs Reviewed - No data to display  EKG None  Radiology Ct Head Wo Contrast  Result Date: 10/28/2018 CLINICAL DATA:   Headache x 2 months off and on, now daily x 1 week; some photosensitivity; no h/o migraines; h/o HTN; EXAM: CT HEAD WITHOUT CONTRAST TECHNIQUE: Contiguous axial images were obtained from the base of the skull through the vertex without intravenous contrast. COMPARISON:  07/10/2017 and previous FINDINGS: Brain: There is no evidence of acute intracranial hemorrhage, brain edema, mass lesion, acute infarction, mass effect, or midline shift. Acute infarct may be inapparent on noncontrast CT. No other intra-axial abnormalities are seen, and the ventricles and sulci are within normal limits in size and symmetry. No abnormal extra-axial fluid collections or masses are identified. Vascular: No hyperdense vessel or unexpected calcification. Skull: Normal. Negative for fracture or focal lesion. Sinuses/Orbits: Retention cyst or polyp in  the left maxillary sinus. Otherwise negative. Other: None. IMPRESSION: Negative Electronically Signed   By: Corlis Leak M.D.   On: 10/28/2018 12:29    Procedures Procedures (including critical care time)  Medications Ordered in ED Medications  metoCLOPramide (REGLAN) injection 10 mg (10 mg Intramuscular Given 10/28/18 1232)  diphenhydrAMINE (BENADRYL) injection 25 mg (25 mg Intramuscular Given 10/28/18 1233)  ketorolac (TORADOL) injection 60 mg (60 mg Intramuscular Given 10/28/18 1233)     Initial Impression / Assessment and Plan / ED Course  I have reviewed the triage vital signs and the nursing notes.  Pertinent labs & imaging results that were available during my care of the patient were reviewed by me and considered in my medical decision making (see chart for details).    Patient has gotten improvement of headache with migraine cocktail.  Patient is general appearance as well.  There is no signs of infectious etiology.  Patient's blood pressures are normal.  No signs of hypertension as etiology.  There are no associated visual signs or dysfunction.  No neurologic  deficits.  Headache has been subacute.  It is more pronounced in the mornings.  CT does not show any acute findings, no mass-effect, no noted significant sinus findings.  Patient's headache is bilateral along the temples.  Possibly migraine versus tension type headaches.  At this time, I feel patient is stable for continued outpatient evaluation and work-up.  I will have him continue some Benadryl, ibuprofen and Reglan for migraine-like headaches.  Patient is instructed to follow-up with his PCP and discuss referral to neurology for further diagnostic or therapeutic management.  Return precautions are reviewed.  Final Clinical Impressions(s) / ED Diagnoses   Final diagnoses:  Bad headache    ED Discharge Orders         Ordered    naproxen (NAPROSYN) 500 MG tablet  2 times daily     10/28/18 1528    metoCLOPramide (REGLAN) 10 MG tablet  Every 6 hours     10/28/18 1528    diphenhydrAMINE (BENADRYL) 25 MG tablet  Every 6 hours     10/28/18 1528           Arby Barrette, MD 10/28/18 1550

## 2018-10-28 NOTE — ED Triage Notes (Signed)
Reports headaches for the past week upon waking up.  Taking ibuprofen without relief.  Denies photophobia, nausea, vomiting, dizziness.

## 2018-11-28 ENCOUNTER — Other Ambulatory Visit: Payer: Self-pay

## 2018-11-28 ENCOUNTER — Encounter: Payer: Self-pay | Admitting: Family Medicine

## 2018-11-28 ENCOUNTER — Ambulatory Visit (INDEPENDENT_AMBULATORY_CARE_PROVIDER_SITE_OTHER): Payer: BLUE CROSS/BLUE SHIELD | Admitting: Family Medicine

## 2018-11-28 VITALS — BP 100/78 | HR 83 | Temp 98.9°F | Ht 74.0 in | Wt 244.2 lb

## 2018-11-28 DIAGNOSIS — G4452 New daily persistent headache (NDPH): Secondary | ICD-10-CM

## 2018-11-28 MED ORDER — TRAMADOL HCL 50 MG PO TABS: 50.0000 mg | ORAL_TABLET | Freq: Three times a day (TID) | ORAL | 0 refills | Status: DC | PRN

## 2018-11-28 NOTE — Patient Instructions (Signed)
Thank you for coming to see me today. It was a pleasure! Today we talked about:   I have placed a referral to neurology.  In the meantime for your headaches you may use tramadol as needed.  We will also obtain an MRI of your brain in order to ensure that there is nothing else going on.  We will call you with these results when they come in.  We can determine at that time if you would need to be seen in the office sooner.  Please follow-up with me in 6 months or sooner as needed.  If you have any questions or concerns, please do not hesitate to call the office at [phone].  Take Care,   [place] Chandler [place], DO

## 2018-11-28 NOTE — Progress Notes (Signed)
Subjective:    Patient ID: Scott Patterson, male    DOB: 15-Sep-1986, 32 y.o.   MRN: [id number]   CC: Physical and headaches   HPI: Scott Patterson is a [age] year old male with past medical history significant for well-controlled hypertension, intermittent asthma, and genital herpes.  Headaches Patient reports that 2 years ago he had a concussion at which point he had headaches after that.  However those completely resolved. Then 3 to 4 months ago patient started waking up with migraines in the morning.  Also started having headaches anytime he would bend down to pick up something or tie his shoes.  Patient has been seen in the ED for this on 2 occasions and CT head showed no acute changes.  Patient was given Benadryl, naproxen, Reglan in the ED which helped with his migraines but is only a temporary fix. Patient is here today because the headaches have not gotten any better and seem to have just increased and how often he is having them.  Patient is a Film/video editorBails Patterson and used to be out running around and being active during his work but now he has been having to do more office things because of his headaches.  Patient reports that the headache feels as if there is more pressure along the sides of his head bilaterally and describes the pain as a throbbing.  Reports that it has woken him up from sleep.  Patient reports that he takes his blood pressure medications every morning and when he has been to the ED his blood pressure has been within normal limits. Patient does report that in July he lost his father and he lost someone else close to him in August and he has thought that maybe anxiety has something to do with this however given that it happens every time he bends over when he is even calm he does not believe that this is the #1 cause. Patient is very physically active and works out as well as runs in order to keep his fitness up.  However patient has been limited to his workouts given that he gets a  headache when he starts trying to lift weights. ROS: Patient denies any neck stiffness although does report some intermittent neck pain not associated with headaches, denies any night sweats, denies any weight loss, denies nausea, vomiting, fever, chills, patient does report that he gets some photophobia and blurry vision during some of the headaches but not with all of them.  Social: Patient reports that he has never tried any tobacco products, never used any illicit drugs, and does use alcohol maybe once per month with having 1 drink but does not want to interfere with his fitness level or causes blood pressure to increase.  CT head without contrast 07/10/2017 Impression: Diffusely increased attenuation of the subarachnoid spaces of the cerebellum.  This may be artifactual as this part of the brain is prone to streak artifacts, however subarachnoid hemorrhage cannot be excluded in the settings of trauma.  If the patient continues to be symptomatic MRI of the brain may be considered.  CT head without contrast 10/28/2018 Impression: Negative  Brain: There is no evidence of acute intracranial hemorrhage, brain edema, mass lesion, acute infarction, mass-effect, or midline shift.  No other intra-axial abnormalities are seen in the ventricles and sulci are within normal limits in size and symmetry.  Smoking status reviewed  ROS: 10 point ROS is otherwise negative, except as mentioned in HPI  Patient Active Problem List  Diagnosis Date Noted  . New daily persistent headache 11/29/2018  . Bilateral knee pain 06/01/2018  . 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 Anxiety 02/06/2018  . Depression 01/11/2014  . Genital herpes 04/02/2011  . ESSENTIAL HYPERTENSION, BENIGN 12/11/2010  . OBESITY, NOS 02/09/2007  . Allergic rhinitis 02/09/2007  . Asthma, mild intermittent 02/09/2007     Objective:  BP 100/78   Pulse 83   Temp 98.9 F (37.2 C) (Oral)   Ht 6\' 2"  (1.88 m)   Wt 244 lb 4 oz (110.8  kg)   SpO2 97%   BMI 31.36 kg/m  Vitals and nursing note reviewed  General: NAD, pleasant, physically fit Cardiac: RRR, normal heart sounds, no murmurs Respiratory: CTAB, normal effort Extremities: no edema or cyanosis. WWP. Skin: warm and dry, no rashes noted Neuro: alert and oriented, no focal deficits, cranial nerves II through XII grossly intact, 5 out of 5 strength in bilateral upper extremities and bilateral lower extremities and sensation intact throughout, normal gait Psych: normal affect, normal thought content and normal judgment  Assessment & Plan:    New daily persistent headache Patient young and healthy with new concerning history of headaches.  Patient being woken up from sleep with headaches and having them every day with increasing intensity.  Also concerned that patient is having these headaches every time he bends down.  Differential includes cyclic migraines, cluster headache (although it is not unilateral), possible aneurysm, hypertension (however patient well-controlled in office today and during ED visits), no mass seen on CT however this is not best imaging.  Spoke with Dr. Corliss Skains, neuro interventional radiologist, over the phone regarding proper studies to obtain of brain to rule out other etiologies for patient's headaches.  Will obtain MRI without and MRA without.  Scheduled for 12/10/2018.  We will also place referral to neurology for patient to be evaluated further for his headaches.   [place] Beronica Lansdale, DO Family Medicine Resident PGY-2

## 2018-11-29 DIAGNOSIS — G4452 New daily persistent headache (NDPH): Secondary | ICD-10-CM | POA: Insufficient documentation

## 2018-11-29 NOTE — Assessment & Plan Note (Addendum)
Patient young and healthy with new concerning history of headaches.  Patient being woken up from sleep with headaches and having them every day with increasing intensity.  Also concerned that patient is having these headaches every time he bends down.  Differential includes cyclic migraines, cluster headache (although it is not unilateral), possible aneurysm, hypertension (however patient well-controlled in office today and during ED visits), no mass seen on CT however this is not best imaging.  Spoke with Dr. Corliss Skainseveshwar, neuro interventional radiologist, over the phone regarding proper studies to obtain of brain to rule out other etiologies for patient's headaches.  Will obtain MRI without and MRA without.  Scheduled for 12/10/2018.  We will also place referral to neurology for patient to be evaluated further for his headaches.

## 2018-11-30 ENCOUNTER — Ambulatory Visit: Payer: BLUE CROSS/BLUE SHIELD | Admitting: Family Medicine

## 2018-12-10 ENCOUNTER — Other Ambulatory Visit: Payer: Self-pay

## 2018-12-17 ENCOUNTER — Other Ambulatory Visit: Payer: Self-pay

## 2018-12-24 ENCOUNTER — Other Ambulatory Visit: Payer: Self-pay

## 2019-01-26 ENCOUNTER — Other Ambulatory Visit: Payer: Self-pay

## 2019-01-26 ENCOUNTER — Encounter: Payer: Self-pay | Admitting: Family Medicine

## 2019-01-26 ENCOUNTER — Ambulatory Visit: Payer: Self-pay | Admitting: Family Medicine

## 2019-01-26 VITALS — BP 122/80 | HR 80 | Temp 98.7°F | Ht 74.0 in | Wt 240.0 lb

## 2019-01-26 DIAGNOSIS — F419 Anxiety disorder, unspecified: Secondary | ICD-10-CM

## 2019-01-26 DIAGNOSIS — J4520 Mild intermittent asthma, uncomplicated: Secondary | ICD-10-CM

## 2019-01-26 MED ORDER — ALBUTEROL SULFATE HFA 108 (90 BASE) MCG/ACT IN AERS: 2.0000 | INHALATION_SPRAY | Freq: Four times a day (QID) | RESPIRATORY_TRACT | 4 refills | Status: DC | PRN

## 2019-01-26 MED ORDER — SERTRALINE HCL 50 MG PO TABS: 50.0000 mg | ORAL_TABLET | Freq: Every day | ORAL | 3 refills | Status: DC

## 2019-01-26 NOTE — Assessment & Plan Note (Signed)
Patient agreeable to seek out therapist. He states he would rather find one as he thinks his insurance will not cover our behavioral health team. - Sertraline 50mg  daily and follow up in one month

## 2019-01-26 NOTE — Progress Notes (Signed)
Subjective: Chief Complaint  Patient presents with  . Asthma     HPI: Scott Patterson is a 33 y.o. presenting to clinic today to discuss the following:  Asthma Patient states he has had increased chest tightness and difficulty taking a breath with SOB over the past few weeks. He has ran out of his albuterol inhaler and had been using it 4-5 days per week. He is not waking from sleep due to difficulty breathing or these symptoms.  Anxiety Patient states he has had anxiety for "a few years" and he used to get medication from an urgent care during acute episodes. He states he thinks his anxiety is due to multiple issues over the past few years which have all begun to add up. He has had job change and career change, custody battles, and family illness all affecting him. He states he has random episodes where he gets sweaty, feels his heart racing, has to sit down gets hot, and sometimes needs water to "calm down". Over the past few weeks he is not sure why but he has had an increased number of these episodes.  Denies any SI or HI.  ROS noted in HPI.   Past Medical, Surgical, Social, and Family History Reviewed & Updated per EMR.   Pertinent Historical Findings include:   Social History   Tobacco Use  Smoking Status Never Smoker  Smokeless Tobacco Never Used      Objective: BP 122/80   Pulse 80   Temp 98.7 F (37.1 C) (Oral)   Ht 6\' 2"  (1.88 m)   Wt 240 lb (108.9 kg)   SpO2 99%   BMI 30.81 kg/m  Vitals and nursing notes reviewed  Physical Exam Gen: Alert and Oriented x 3, NAD HEENT: Normocephalic, atraumatic CV: RRR, no murmurs, normal S1, S2 split Resp: CTAB, no wheezing, rales, or rhonchi, comfortable work of breathing Ext: no clubbing, cyanosis, or edema Skin: warm, dry, intact, no rashes Assessment/Plan:  Asthma, mild intermittent Given frequency of symptoms and use of albuterol inhaler I suspect he is not well controlled and may benefit from a controller  medication. However, difficult to ascertain how much anxiety is also playing a role in these episodes. - Refill albuterol inhaler  - Referral to Dr. Raymondo Band for PFTs  Anxiety Patient agreeable to seek out therapist. He states he would rather find one as he thinks his insurance will not cover our behavioral health team. - Sertraline 50mg  daily and follow up in one month   PATIENT EDUCATION PROVIDED: See AVS    Diagnosis and plan along with any newly prescribed medication(s) were discussed in detail with this patient today. The patient verbalized understanding and agreed with the plan. Patient advised if symptoms worsen return to clinic or ER.   Orders Placed This Encounter  Procedures  . Pulmonary function test    Standing Status:   Future    Standing Expiration Date:   01/27/2020    Order Specific Question:   Where should this test be performed?    Answer:   Other    Meds ordered this encounter  Medications  . albuterol (PROVENTIL HFA;VENTOLIN HFA) 108 (90 Base) MCG/ACT inhaler    Sig: Inhale 2 puffs into the lungs every 6 (six) hours as needed for wheezing or shortness of breath.    Dispense:  1 Inhaler    Refill:  4  . sertraline (ZOLOFT) 50 MG tablet    Sig: Take 1 tablet (50 mg total)  by mouth daily.    Dispense:  30 tablet    Refill:  3     Tim Karen Chafe, DO 01/26/2019, 2:31 PM PGY-2 [facility]

## 2019-01-26 NOTE — Assessment & Plan Note (Signed)
Given frequency of symptoms and use of albuterol inhaler I suspect he is not well controlled and may benefit from a controller medication. However, difficult to ascertain how much anxiety is also playing a role in these episodes. - Refill albuterol inhaler  - Referral to Dr. Raymondo Band for PFTs

## 2019-01-26 NOTE — Patient Instructions (Signed)
It was great to meet you today! Thank you for letting me participate in your care!  Today, we discussed your asthma and I am concerned it is not well controlled as you are using your albuterol inhaler too often. Please schedule an appointment with Dr. Raymondo Band for pulmonary function test to see if we need to modify your asthma medications.  Also, please request an appointment with our behavioral health team here at our clinic before you leave today. I am starting you on a new medication for anxiety called Sertraline. Please take it as prescribed.  Be well, Scott Schick, DO PGY-2, [facility]

## 2019-02-02 ENCOUNTER — Ambulatory Visit: Payer: Self-pay | Admitting: Pharmacist

## 2019-02-21 ENCOUNTER — Ambulatory Visit: Payer: BLUE CROSS/BLUE SHIELD | Admitting: Neurology

## 2019-02-21 ENCOUNTER — Other Ambulatory Visit: Payer: Self-pay | Admitting: Family Medicine

## 2019-02-21 DIAGNOSIS — G4452 New daily persistent headache (NDPH): Secondary | ICD-10-CM

## 2019-02-21 NOTE — Progress Notes (Deleted)
[facility]    Provider:  Dr Lucia Gaskins Requesting Provider: Shirley, [place], DO Primary Care Provider:  Shirley, [place], DO  CC:  headaches  HPI:  Scott Patterson is a 33 y.o. male here as requested by Shirley, [place], DO for new daily persistent headaches.  Past medical history anxiety, asthma, depression, hypertension.  I attempted to call patient as I suspect this may be sleep apnea and he would be best seeing our sleep tea. I wanted to briefly discuss is symptoms and see what he wanted to do. Unfortunately I called 3x and each time someone hung up on me. Unfortunately if he comes in will see him but then will have to refer to sleep team if I think this is sleep apnea. I also sent a note to Dr. [place] as she probably isn;t aware that our sleep team is separate from the general neurologists and not everyone here can treat for sleep apnea.   Reviewed notes, labs and imaging from outside physicians, which showed ***    CT head showed No acute intracranial abnormalities including mass lesion or mass effect, hydrocephalus, extra-axial fluid collection, midline shift, hemorrhage, or acute infarction, large ischemic events (personally reviewed images)   Reviewed notes from the emergency room and also Dr. Elvis Coil notes.  Patient was seen in the emergency room October 28, 2018.  Patient has been getting headaches that is mostly present in the morning when he wakes up for several weeks.  He reports every day he has a throbbing headache in the sides of his head.  It alleviates a little bit during the course of the day.  During the day he feels somewhat dizzy.  No visual changes.  No photophobia nausea or vomiting.  No weakness or numbness.  Headaches made worse by bending over.  Per Dr. [place] 2 years ago he had a concussion and had headaches after that.  Those completely resolved.  3 to 4 months ago patient started waking up with headaches in the morning.  Also started having  headaches anytime he would bend down to pick up something or tie his shoe.  He has been in the ED for this on 2 occasions  Review of Systems: Patient complains of symptoms per HPI as well as the following symptoms ***. Pertinent negatives and positives per HPI. All others negative.   Social History   Socioeconomic History  . Marital status: Single    Spouse name: Not on file  . Number of children: Not on file  . Years of education: Not on file  . Highest education level: Not on file  Occupational History  . Occupation: BOF    Comment: Mortgages  Social Needs  . Financial resource strain: Not on file  . Food insecurity:    Worry: Not on file    Inability: Not on file  . Transportation needs:    Medical: Not on file    Non-medical: Not on file  Tobacco Use  . Smoking status: Never Smoker  . Smokeless tobacco: Never Used  Substance and Sexual Activity  . Alcohol use: No  . Drug use: No  . Sexual activity: Not on file  Lifestyle  . Physical activity:    Days per week: Not on file    Minutes per session: Not on file  . Stress: Not on file  Relationships  . Social connections:    Talks on phone: Not on file    Gets together: Not on file    Attends religious  service: Not on file    Active member of club or organization: Not on file    Attends meetings of clubs or organizations: Not on file    Relationship status: Not on file  . Intimate partner violence:    Fear of current or ex partner: Not on file    Emotionally abused: Not on file    Physically abused: Not on file    Forced sexual activity: Not on file  Other Topics Concern  . Not on file  Social History Narrative   Lives alone.      Family History  Problem Relation Age of Onset  . Depression Mother   . Hypertension Mother   . Alcohol abuse Father   . Drug abuse Father   . Stroke Father   . Hypertension Father   . Alcohol abuse Paternal Uncle   . Hypertension Brother     Past Medical History:   Diagnosis Date  . Anxiety   . Asthma   . Depression   . Hypertension     Patient Active Problem List   Diagnosis Date Noted  . New daily persistent headache 11/29/2018  . Bilateral knee pain 06/01/2018  . 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 Anxiety 02/06/2018  . Depression 01/11/2014  . Genital herpes 04/02/2011  . ESSENTIAL HYPERTENSION, BENIGN 12/11/2010  . OBESITY, NOS 02/09/2007  . Allergic rhinitis 02/09/2007  . Asthma, mild intermittent 02/09/2007    Past Surgical History:  Procedure Laterality Date  . None      Current Outpatient Medications  Medication Sig Dispense Refill  . albuterol (PROVENTIL HFA;VENTOLIN HFA) 108 (90 Base) MCG/ACT inhaler Inhale 2 puffs into the lungs every 6 (six) hours as needed for wheezing or shortness of breath. 1 Inhaler 4  . amLODipine (NORVASC) 5 MG tablet Take 5 mg by mouth daily.  2  . candesartan (ATACAND) 4 MG tablet Take 1 tablet (4 mg total) by mouth daily. 30 tablet 11  . cetirizine (ZYRTEC) 10 MG tablet Take 1 tablet (10 mg total) by mouth daily. 30 tablet 5  . diphenhydrAMINE (BENADRYL) 25 MG tablet Take 1 tablet (25 mg total) by mouth every 6 (six) hours. 20 tablet 0  . fluticasone (FLONASE) 50 MCG/ACT nasal spray USE 2 SPRAY(S) IN EACH NOSTRIL ONCE DAILY 16 g 2  . hydrochlorothiazide (HYDRODIURIL) 12.5 MG tablet Take 12.5 mg by mouth daily.  5  . metoCLOPramide (REGLAN) 10 MG tablet Take 1 tablet (10 mg total) by mouth every 6 (six) hours. 1 tablet every 6-8 hours with Benadryl for headache. 30 tablet 0  . naproxen (NAPROSYN) 500 MG tablet Take 1 tablet (500 mg total) by mouth 2 (two) times daily. 30 tablet 0  . prochlorperazine (COMPAZINE) 10 MG tablet Take 1 tablet (10 mg total) by mouth once for 1 dose. 1 tablet 0  . sertraline (ZOLOFT) 50 MG tablet Take 1 tablet (50 mg total) by mouth daily. 30 tablet 3  . traMADol (ULTRAM) 50 MG tablet Take 1 tablet (50 mg total) by mouth every 8 (eight) hours as needed. 10 tablet 0    No current facility-administered medications for this visit.     Allergies as of 02/21/2019  . (No Known Allergies)    Vitals: There were no vitals taken for this visit. Last Weight:  Wt Readings from Last 1 Encounters:  01/26/19 240 lb (108.9 kg)   Last Height:   Ht Readings from Last 1 Encounters:  01/26/19 6\' 2"  (1.88 m)  Physical exam: Exam: Gen: NAD, conversant, well nourised, obese, well groomed                     CV: RRR, no MRG. No Carotid Bruits. No peripheral edema, warm, nontender Eyes: Conjunctivae clear without exudates or hemorrhage  Neuro: Detailed Neurologic Exam  Speech:    Speech is normal; fluent and spontaneous with normal comprehension.  Cognition:    The patient is oriented to person, place, and time;     recent and remote memory intact;     language fluent;     normal attention, concentration,     fund of knowledge Cranial Nerves:    The pupils are equal, round, and reactive to light. The fundi are normal and spontaneous venous pulsations are present. Visual fields are full to finger confrontation. Extraocular movements are intact. Trigeminal sensation is intact and the muscles of mastication are normal. The face is symmetric. The palate elevates in the midline. Hearing intact. Voice is normal. Shoulder shrug is normal. The tongue has normal motion without fasciculations.   Coordination:    Normal finger to nose and heel to shin. Normal rapid alternating movements.   Gait:    Heel-toe and tandem gait are normal.   Motor Observation:    No asymmetry, no atrophy, and no involuntary movements noted. Tone:    Normal muscle tone.    Posture:    Posture is normal. normal erect    Strength:    Strength is V/V in the upper and lower limbs.      Sensation: intact to LT     Reflex Exam:  DTR's:    Deep tendon reflexes in the upper and lower extremities are normal bilaterally.   Toes:    The toes are downgoing bilaterally.   Clonus:     Clonus is absent.    Assessment/Plan:    No orders of the defined types were placed in this encounter.  No orders of the defined types were placed in this encounter.   Cc: Shirley, [place], DO,    Naomie Dean, MD  [facility] [address] [place], [place] [address]  Phone [phone] Fax [phone]

## 2019-02-21 NOTE — Progress Notes (Signed)
Will place referral for sleep study per neurology recommendation. Patient had order placed by cardiology in 2016 for concern for OSA, however patient appears to have not completed this based on chart review. Will call to discuss with patient at later time.   [place] Cooper Stamp, DO PGY-2, [facility]

## 2019-03-13 ENCOUNTER — Telehealth (INDEPENDENT_AMBULATORY_CARE_PROVIDER_SITE_OTHER): Payer: BLUE CROSS/BLUE SHIELD | Admitting: Family Medicine

## 2019-03-13 ENCOUNTER — Encounter (HOSPITAL_BASED_OUTPATIENT_CLINIC_OR_DEPARTMENT_OTHER): Payer: Self-pay | Admitting: *Deleted

## 2019-03-13 ENCOUNTER — Emergency Department (HOSPITAL_BASED_OUTPATIENT_CLINIC_OR_DEPARTMENT_OTHER)
Admission: EM | Admit: 2019-03-13 | Discharge: 2019-03-13 | Disposition: A | Discharge status: Home / Self Care | Payer: BLUE CROSS/BLUE SHIELD | Attending: Emergency Medicine | Admitting: Emergency Medicine

## 2019-03-13 ENCOUNTER — Other Ambulatory Visit: Payer: Self-pay

## 2019-03-13 DIAGNOSIS — R0600 Dyspnea, unspecified: Secondary | ICD-10-CM | POA: Insufficient documentation

## 2019-03-13 DIAGNOSIS — I10 Essential (primary) hypertension: Secondary | ICD-10-CM | POA: Insufficient documentation

## 2019-03-13 DIAGNOSIS — Z79899 Other long term (current) drug therapy: Secondary | ICD-10-CM | POA: Insufficient documentation

## 2019-03-13 DIAGNOSIS — M791 Myalgia, unspecified site: Secondary | ICD-10-CM | POA: Diagnosis not present

## 2019-03-13 DIAGNOSIS — J45909 Unspecified asthma, uncomplicated: Secondary | ICD-10-CM | POA: Insufficient documentation

## 2019-03-13 DIAGNOSIS — R05 Cough: Secondary | ICD-10-CM | POA: Insufficient documentation

## 2019-03-13 DIAGNOSIS — J111 Influenza due to unidentified influenza virus with other respiratory manifestations: Secondary | ICD-10-CM | POA: Diagnosis not present

## 2019-03-13 DIAGNOSIS — R69 Illness, unspecified: Secondary | ICD-10-CM

## 2019-03-13 DIAGNOSIS — R0989 Other specified symptoms and signs involving the circulatory and respiratory systems: Secondary | ICD-10-CM | POA: Diagnosis present

## 2019-03-13 DIAGNOSIS — J988 Other specified respiratory disorders: Secondary | ICD-10-CM

## 2019-03-13 DIAGNOSIS — B9789 Other viral agents as the cause of diseases classified elsewhere: Secondary | ICD-10-CM | POA: Diagnosis not present

## 2019-03-13 MED ORDER — ALBUTEROL SULFATE HFA 108 (90 BASE) MCG/ACT IN AERS: 2.0000 | INHALATION_SPRAY | Freq: Four times a day (QID) | RESPIRATORY_TRACT | 4 refills | Status: DC | PRN

## 2019-03-13 MED ORDER — HYDROCHLOROTHIAZIDE 12.5 MG PO TABS: 12.5000 mg | ORAL_TABLET | Freq: Every day | ORAL | 3 refills | Status: DC

## 2019-03-13 MED ORDER — FLUTICASONE PROPIONATE 50 MCG/ACT NA SUSP: NASAL | 2 refills | Status: DC

## 2019-03-13 NOTE — Progress Notes (Signed)
Agrees to phone visit. Sick with sore throat x 1 week. Did not have fever until last night.    Now has fever and body aches. Also has dry cough and mild SOB.  Just went to hospital, told he may have Covid.  O2 level tested and OK.  Told no wheezing.  Told to go home and isolate and call his doctor.  Speak in full sentences.  Does not describe himself as severely ill.  Only mild SOB.  Also needs refills on meds.  Has moved and needs Rx sent to [facility] in [place].  Given red flag sx for return, I.e. severe SOB  Emphasized isolation.  Patient understands and had no further questions. Duration of visit 10 minutes.

## 2019-03-13 NOTE — ED Provider Notes (Signed)
[facility] EMERGENCY DEPARTMENT Provider Note   CSN: [id number] Arrival date & time: 03/13/19  1009    History   Chief Complaint Chief Complaint  Patient presents with  . Generalized Body Aches    HPI Scott Patterson is a 33 y.o. male.     HPI  33 year old male presents with dry throat and cough.  He states he has been having a dry throat feeling for about a week.  He is also noticed a cough that is intermittent and mild.  Nonproductive.  Yesterday he felt like his body was warm like he had a fever but did not check his temperature.  He also felt some body aches.  He states he has asthma and has a little bit of dyspnea but does not feel like it is bad.  No vomiting or significant headache.  He states his throat does not hurt, just feels dry.  Past Medical History:  Diagnosis Date  . Anxiety   . Asthma   . Depression   . Hypertension     Patient Active Problem List   Diagnosis Date Noted  . New daily persistent headache 11/29/2018  . Bilateral knee pain 06/01/2018  . 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 Anxiety 02/06/2018  . Depression 01/11/2014  . Genital herpes 04/02/2011  . ESSENTIAL HYPERTENSION, BENIGN 12/11/2010  . OBESITY, NOS 02/09/2007  . Allergic rhinitis 02/09/2007  . Asthma, mild intermittent 02/09/2007    Past Surgical History:  Procedure Laterality Date  . None          Home Medications    Prior to Admission medications   Medication Sig Start Date End Date Taking? Authorizing Provider  albuterol (PROVENTIL HFA;VENTOLIN HFA) 108 (90 Base) MCG/ACT inhaler Inhale 2 puffs into the lungs every 6 (six) hours as needed for wheezing or shortness of breath. 01/26/19   Arlyce Harman, DO  cetirizine (ZYRTEC) 10 MG tablet Take 1 tablet (10 mg total) by mouth daily. 01/20/17   Bonney Aid, MD  diphenhydrAMINE (BENADRYL) 25 MG tablet Take 1 tablet (25 mg total) by mouth every 6 (six) hours. 10/28/18   Arby Barrette, MD  fluticasone  Aleda Grana) 50 MCG/ACT nasal spray USE 2 SPRAY(S) IN EACH NOSTRIL ONCE DAILY 09/25/18   Myrene Buddy, MD  hydrochlorothiazide (HYDRODIURIL) 12.5 MG tablet Take 12.5 mg by mouth daily. 06/14/18   [provider]  metoCLOPramide (REGLAN) 10 MG tablet Take 1 tablet (10 mg total) by mouth every 6 (six) hours. 1 tablet every 6-8 hours with Benadryl for headache. 10/28/18   Arby Barrette, MD  naproxen (NAPROSYN) 500 MG tablet Take 1 tablet (500 mg total) by mouth 2 (two) times daily. 10/28/18   Arby Barrette, MD  prochlorperazine (COMPAZINE) 10 MG tablet Take 1 tablet (10 mg total) by mouth once for 1 dose. 09/27/18 09/27/18  Curatolo, Adam, DO  sertraline (ZOLOFT) 50 MG tablet Take 1 tablet (50 mg total) by mouth daily. 01/26/19   Arlyce Harman, DO  traMADol (ULTRAM) 50 MG tablet Take 1 tablet (50 mg total) by mouth every 8 (eight) hours as needed. 11/28/18   Shirley, [place], DO    Family History Family History  Problem Relation Age of Onset  . Depression Mother   . Hypertension Mother   . Alcohol abuse Father   . Drug abuse Father   . Stroke Father   . Hypertension Father   . Alcohol abuse Paternal Uncle   . Hypertension Brother     Social History Social History  Tobacco Use  . Smoking status: Never Smoker  . Smokeless tobacco: Never Used  Substance Use Topics  . Alcohol use: No  . Drug use: No     Allergies   Patient has no known allergies.   Review of Systems Review of Systems  Constitutional: Positive for fever (subjective).  HENT: Positive for sore throat.   Respiratory: Positive for cough and shortness of breath.   Gastrointestinal: Negative for vomiting.  Neurological: Negative for headaches.  All other systems reviewed and are negative.    Physical Exam Updated Vital Signs BP (!) 135/96   Pulse 78   Temp 98.1 F (36.7 C)   Resp 18   Ht 6\' 2"  (1.88 m)   Wt 111.1 kg   SpO2 99%   BMI 31.46 kg/m   Physical Exam Vitals signs and nursing note  reviewed.  Constitutional:      Appearance: He is well-developed. He is obese. He is not ill-appearing or diaphoretic.  HENT:     Head: Normocephalic and atraumatic.     Right Ear: External ear normal.     Left Ear: External ear normal.     Nose: Nose normal.     Mouth/Throat:     Mouth: Mucous membranes are moist.     Pharynx: No oropharyngeal exudate or posterior oropharyngeal erythema.  Eyes:     General:        Right eye: No discharge.        Left eye: No discharge.  Neck:     Musculoskeletal: Neck supple.  Cardiovascular:     Rate and Rhythm: Normal rate and regular rhythm.     Heart sounds: Normal heart sounds.  Pulmonary:     Effort: Pulmonary effort is normal.     Breath sounds: Normal breath sounds. No wheezing, rhonchi or rales.  Abdominal:     Palpations: Abdomen is soft.     Tenderness: There is no abdominal tenderness.  Skin:    General: Skin is warm and dry.  Neurological:     Mental Status: He is alert.  Psychiatric:        Mood and Affect: Mood is not anxious.      ED Treatments / Results  Labs (all labs ordered are listed, but only abnormal results are displayed) Labs Reviewed - No data to display  EKG None  Radiology No results found.  Procedures Procedures (including critical care time)  Medications Ordered in ED Medications - No data to display   Initial Impression / Assessment and Plan / ED Course  I have reviewed the triage vital signs and the nursing notes.  Pertinent labs & imaging results that were available during my care of the patient were reviewed by me and considered in my medical decision making (see chart for details).        Patient is well-appearing and his vital signs are reassuring.  While he does have some viral illness-like symptoms and very well may have COVID-19, he is quite well-appearing and does not appear to need hospitalization or further work-up at this time.  With clear lungs, no increased work of breathing  or hypoxia, I do not think chest x-ray would be beneficial.  Labs are not indicated at this time.  I discussed needing to self quarantine as well as return precautions.  No indication of a bacterial illness at this time.  Scott Patterson was evaluated in Emergency Department on 03/13/2019 for the symptoms described in the history of present illness.  He was evaluated in the context of the global COVID-19 pandemic, which necessitated consideration that the patient might be at risk for infection with the SARS-CoV-2 virus that causes COVID-19. Institutional protocols and algorithms that pertain to the evaluation of patients at risk for COVID-19 are in a state of rapid change based on information released by regulatory bodies including the CDC and federal and state organizations. These policies and algorithms were followed during the patient's care in the ED.  Final Clinical Impressions(s) / ED Diagnoses   Final diagnoses:  Influenza-like illness    ED Discharge Orders    None       Pricilla Loveless, MD 03/13/19 1055

## 2019-03-13 NOTE — Discharge Instructions (Signed)
At this time, you are presumed to have an upper respiratory infection and possibly COVID-19.  You need to self quarantine for the next 7 days and to be symptom-free for at least 3 days.  If at any point you feel you are having trouble breathing or having any other new/concerning symptoms then return to the ER for evaluation.     Person Under Monitoring Name: Scott Patterson  Location: [address] [place] [address]   Infection Prevention Recommendations for Individuals Confirmed to have, or Being Evaluated for, 2019 Novel Coronavirus (COVID-19) Infection Who Receive Care at Home  Individuals who are confirmed to have, or are being evaluated for, COVID-19 should follow the prevention steps below until a healthcare provider or local or state health department says they can return to normal activities.  Stay home except to get medical care You should restrict activities outside your home, except for getting medical care. Do not go to work, school, or public areas, and do not use public transportation or taxis.  Call ahead before visiting your doctor Before your medical appointment, call the healthcare provider and tell them that you have, or are being evaluated for, COVID-19 infection. This will help the healthcare providers office take steps to keep other people from getting infected. Ask your healthcare provider to call the local or state health department.  Monitor your symptoms Seek prompt medical attention if your illness is worsening (e.g., difficulty breathing). Before going to your medical appointment, call the healthcare provider and tell them that you have, or are being evaluated for, COVID-19 infection. Ask your healthcare provider to call the local or state health department.  Wear a facemask You should wear a facemask that covers your nose and mouth when you are in the same room with other people and when you visit a healthcare provider. People who live with or visit you  should also wear a facemask while they are in the same room with you.  Separate yourself from other people in your home As much as possible, you should stay in a different room from other people in your home. Also, you should use a separate bathroom, if available.  Avoid sharing household items You should not share dishes, drinking glasses, cups, eating utensils, towels, bedding, or other items with other people in your home. After using these items, you should wash them thoroughly with soap and water.  Cover your coughs and sneezes Cover your mouth and nose with a tissue when you cough or sneeze, or you can cough or sneeze into your sleeve. Throw used tissues in a lined trash can, and immediately wash your hands with soap and water for at least 20 seconds or use an alcohol-based hand rub.  Wash your [employer] your hands often and thoroughly with soap and water for at least 20 seconds. You can use an alcohol-based hand sanitizer if soap and water are not available and if your hands are not visibly dirty. Avoid touching your eyes, nose, and mouth with unwashed hands.   Prevention Steps for Caregivers and Household Members of Individuals Confirmed to have, or Being Evaluated for, COVID-19 Infection Being Cared for in the Home  If you live with, or provide care at home for, a person confirmed to have, or being evaluated for, COVID-19 infection please follow these guidelines to prevent infection:  Follow healthcare providers instructions Make sure that you understand and can help the patient follow any healthcare provider instructions for all care.  Provide for the  patients basic needs You should help the patient with basic needs in the home and provide support for getting groceries, prescriptions, and other personal needs.  Monitor the patients symptoms If they are getting sicker, call his or her medical provider and tell them that the patient has, or is being evaluated  for, COVID-19 infection. This will help the healthcare providers office take steps to keep other people from getting infected. Ask the healthcare provider to call the local or state health department.  Limit the number of people who have contact with the patient If possible, have only one caregiver for the patient. Other household members should stay in another home or place of residence. If this is not possible, they should stay in another room, or be separated from the patient as much as possible. Use a separate bathroom, if available. Restrict visitors who do not have an essential need to be in the home.  Keep older adults, very young children, and other sick people away from the patient Keep older adults, very young children, and those who have compromised immune systems or chronic health conditions away from the patient. This includes people with chronic heart, lung, or kidney conditions, diabetes, and cancer.  Ensure good ventilation Make sure that shared spaces in the home have good air flow, such as from an air conditioner or an opened window, weather permitting.  Wash your hands often Wash your hands often and thoroughly with soap and water for at least 20 seconds. You can use an alcohol based hand sanitizer if soap and water are not available and if your hands are not visibly dirty. Avoid touching your eyes, nose, and mouth with unwashed hands. Use disposable paper towels to dry your hands. If not available, use dedicated cloth towels and replace them when they become wet.  Wear a facemask and gloves Wear a disposable facemask at all times in the room and gloves when you touch or have contact with the patients blood, body fluids, and/or secretions or excretions, such as sweat, saliva, sputum, nasal mucus, vomit, urine, or feces.  Ensure the mask fits over your nose and mouth tightly, and do not touch it during use. Throw out disposable facemasks and gloves after using them. Do not  reuse. Wash your hands immediately after removing your facemask and gloves. If your personal clothing becomes contaminated, carefully remove clothing and launder. Wash your hands after handling contaminated clothing. Place all used disposable facemasks, gloves, and other waste in a lined container before disposing them with other household waste. Remove gloves and wash your hands immediately after handling these items.  Do not share dishes, glasses, or other household items with the patient Avoid sharing household items. You should not share dishes, drinking glasses, cups, eating utensils, towels, bedding, or other items with a patient who is confirmed to have, or being evaluated for, COVID-19 infection. After the person uses these items, you should wash them thoroughly with soap and water.  Wash laundry thoroughly Immediately remove and wash clothes or bedding that have blood, body fluids, and/or secretions or excretions, such as sweat, saliva, sputum, nasal mucus, vomit, urine, or feces, on them. Wear gloves when handling laundry from the patient. Read and follow directions on labels of laundry or clothing items and detergent. In general, wash and dry with the warmest temperatures recommended on the label.  Clean all areas the individual has used often Clean all touchable surfaces, such as counters, tabletops, doorknobs, bathroom fixtures, toilets, phones, keyboards, tablets, and bedside tables,  every day. Also, clean any surfaces that may have blood, body fluids, and/or secretions or excretions on them. Wear gloves when cleaning surfaces the patient has come in contact with. Use a diluted bleach solution (e.g., dilute bleach with 1 part bleach and 10 parts water) or a household disinfectant with a label that says EPA-registered for coronaviruses. To make a bleach solution at home, add 1 tablespoon of bleach to 1 quart (4 cups) of water. For a larger supply, add  cup of bleach to 1 gallon (16  cups) of water. Read labels of cleaning products and follow recommendations provided on product labels. Labels contain instructions for safe and effective use of the cleaning product including precautions you should take when applying the product, such as wearing gloves or eye protection and making sure you have good ventilation during use of the product. Remove gloves and wash hands immediately after cleaning.  Monitor yourself for signs and symptoms of illness Caregivers and household members are considered close contacts, should monitor their health, and will be asked to limit movement outside of the home to the extent possible. Follow the monitoring steps for close contacts listed on the symptom monitoring form.   ? If you have additional questions, contact your local health department or call the epidemiologist on call at [phone] (available 24/7). ? This guidance is subject to change. For the most up-to-date guidance from [facility], please refer to their website: [email or web address]

## 2019-03-13 NOTE — Assessment & Plan Note (Signed)
PUI.  Does not meet testing criteria.  Isolate

## 2019-03-13 NOTE — ED Triage Notes (Signed)
Pt c/o sore throat x 3 days , fever and body aches x 1 day

## 2019-03-17 ENCOUNTER — Other Ambulatory Visit: Payer: Self-pay | Admitting: Family Medicine

## 2019-03-22 ENCOUNTER — Other Ambulatory Visit: Payer: Self-pay

## 2019-03-22 ENCOUNTER — Telehealth (INDEPENDENT_AMBULATORY_CARE_PROVIDER_SITE_OTHER): Payer: BLUE CROSS/BLUE SHIELD | Admitting: Family Medicine

## 2019-03-22 DIAGNOSIS — J4530 Mild persistent asthma, uncomplicated: Secondary | ICD-10-CM

## 2019-03-22 MED ORDER — FLUTICASONE PROPIONATE 50 MCG/ACT NA SUSP: NASAL | 2 refills | Status: DC

## 2019-03-22 MED ORDER — SPACER/AERO-HOLD CHAMBER BAGS MISC: 1 refills | Status: AC

## 2019-03-22 MED ORDER — FLUTICASONE FUROATE-VILANTEROL 100-25 MCG/INH IN AEPB: 1.0000 | INHALATION_SPRAY | Freq: Every day | RESPIRATORY_TRACT | 3 refills | Status: DC

## 2019-03-22 MED ORDER — MONTELUKAST SODIUM 10 MG PO TABS: 10.0000 mg | ORAL_TABLET | Freq: Every day | ORAL | 3 refills | Status: DC

## 2019-03-22 MED ORDER — ALBUTEROL SULFATE HFA 108 (90 BASE) MCG/ACT IN AERS: 2.0000 | INHALATION_SPRAY | Freq: Four times a day (QID) | RESPIRATORY_TRACT | 4 refills | Status: DC | PRN

## 2019-03-22 NOTE — Progress Notes (Signed)
[facility] Telemedicine Visit  Patient consented to have visit conducted via telephone.  Encounter participants: Patient: Scott Patterson  Provider: Westley Chandler   Chief Complaint: Persistent cough  HPI:  Scott Patterson is a 33 year old male with a history of persistent asthma hypertension presenting via telephone visit for persistent cough and dyspnea at times.  The patient has been seen in the emergency room twice at 2 urgent cares and also called our clinic for his concerns.  He is undergone testing for both influenza improvement.  Both are negative.  He reports overall symptoms are mildly improved.  His most bothersome symptoms currently is his lingering cough and nasal congestion.  He reports he is taking his allergy medications as prescribed although he is out of both of his allergy pills for the last week.  He is using his albuterol inhaler 4-6 times a day.  He has gotten a steroid dosepak from to urgent care locations. He denies chest pain, dyspnea on exertion, orthopnea, lower extremity edema.  He reports he has some tightness and wheezing at times. The patient does not smoke.  He denies alcohol or marijuana use. ROS: Negative for fevers, chest pain and the remainder as above.  Pertinent PMHx:   Problem list documented is mild asthma.  The patient has been on Advair and fluticasone a rather large doses in the past. Exam:  Respiratory: Speaking in full sentences.  Breathing comfortably.  He is walking around a grocery store during most of our conversation.  At the end of our conversation at checkout he is able to still speak in full sentences.  Assessment/Plan: Diagnoses and all orders for this visit:  Mild persistent asthma -     montelukast (SINGULAIR) 10 MG tablet; Take 1 tablet (10 mg total) by mouth at bedtime. -     fluticasone (FLONASE) 50 MCG/ACT nasal spray; USE 2 SPRAY(S) IN EACH NOSTRIL ONCE DAILY -     Spacer/Aero-Hold Chamber Bags MISC; Use  with inhaler -     fluticasone furoate-vilanterol (BREO ELLIPTA) 100-25 MCG/INH AEPB; Inhale 1 puff into the lungs daily. -     albuterol (PROVENTIL HFA;VENTOLIN HFA) 108 (90 Base) MCG/ACT inhaler; Inhale 2 puffs into the lungs every 6 (six) hours as needed for wheezing or shortness of breath. -   The patient reports a many year history of asthma since he was a child.  We had no pulmonary function tests on file.  This could also represent a restrictive type lung disease or less likely interstitial lung disease.  He has had persistent symptoms for some time that go on and off with weather changes.  In February he was seen by Dr. Karen Chafe and restarted on his inhalers.  He previously was on multiple oral inhalers from 20 12-20 18.  He would likely benefit from spirometry testing with Dr. Raymondo Band and review of medication clearance.  If he continues to have symptoms recommend referral to pulmonology and consideration of imaging.    Time spent on phone with patient: 10 minutes

## 2019-03-25 ENCOUNTER — Other Ambulatory Visit: Payer: Self-pay

## 2019-03-25 ENCOUNTER — Emergency Department (HOSPITAL_COMMUNITY)
Admission: EM | Admit: 2019-03-25 | Discharge: 2019-03-25 | Disposition: A | Discharge status: Home / Self Care | Payer: BLUE CROSS/BLUE SHIELD | Attending: Emergency Medicine | Admitting: Emergency Medicine

## 2019-03-25 ENCOUNTER — Encounter (HOSPITAL_COMMUNITY): Payer: Self-pay

## 2019-03-25 ENCOUNTER — Emergency Department (HOSPITAL_COMMUNITY): Payer: BLUE CROSS/BLUE SHIELD

## 2019-03-25 DIAGNOSIS — J4541 Moderate persistent asthma with (acute) exacerbation: Secondary | ICD-10-CM

## 2019-03-25 DIAGNOSIS — Z79899 Other long term (current) drug therapy: Secondary | ICD-10-CM | POA: Insufficient documentation

## 2019-03-25 DIAGNOSIS — R0602 Shortness of breath: Secondary | ICD-10-CM | POA: Diagnosis present

## 2019-03-25 DIAGNOSIS — I10 Essential (primary) hypertension: Secondary | ICD-10-CM | POA: Diagnosis not present

## 2019-03-25 LAB — CBC WITH DIFFERENTIAL/PLATELET
Abs Immature Granulocytes: 0.04 10*3/uL (ref 0.00–0.07)
Basophils Absolute: 0 10*3/uL (ref 0.0–0.1)
Basophils Relative: 0 %
Eosinophils Absolute: 0 10*3/uL (ref 0.0–0.5)
Eosinophils Relative: 0 %
HCT: 47.5 % (ref 39.0–52.0)
Hemoglobin: 14.8 g/dL (ref 13.0–17.0)
Immature Granulocytes: 1 %
Lymphocytes Relative: 32 %
Lymphs Abs: 2.9 10*3/uL (ref 0.7–4.0)
MCH: 25.4 pg — ABNORMAL LOW (ref 26.0–34.0)
MCHC: 31.2 g/dL (ref 30.0–36.0)
MCV: 81.6 fL (ref 80.0–100.0)
Monocytes Absolute: 0.9 10*3/uL (ref 0.1–1.0)
Monocytes Relative: 10 %
Neutro Abs: 5 10*3/uL (ref 1.7–7.7)
Neutrophils Relative %: 57 %
Platelets: 172 10*3/uL (ref 150–400)
RBC: 5.82 MIL/uL — ABNORMAL HIGH (ref 4.22–5.81)
RDW: 14.5 % (ref 11.5–15.5)
WBC: 8.9 10*3/uL (ref 4.0–10.5)
nRBC: 0 % (ref 0.0–0.2)

## 2019-03-25 LAB — BASIC METABOLIC PANEL
Anion gap: 9 (ref 5–15)
BUN: 17 mg/dL (ref 6–20)
CO2: 25 mmol/L (ref 22–32)
Calcium: 9.1 mg/dL (ref 8.9–10.3)
Chloride: 102 mmol/L (ref 98–111)
Creatinine, Ser: 1.43 mg/dL — ABNORMAL HIGH (ref 0.61–1.24)
GFR calc Af Amer: >60 mL/min (ref >60)
GFR calc non Af Amer: >60 mL/min (ref >60)
Glucose, Bld: 103 mg/dL — ABNORMAL HIGH (ref 70–99)
Potassium: 3.7 mmol/L (ref 3.5–5.1)
Sodium: 136 mmol/L (ref 135–145)

## 2019-03-25 MED ORDER — DOXYCYCLINE HYCLATE 100 MG PO CAPS: 100.0000 mg | ORAL_CAPSULE | Freq: Two times a day (BID) | ORAL | 0 refills | Status: DC

## 2019-03-25 MED ORDER — PREDNISONE 10 MG PO TABS: 20.0000 mg | ORAL_TABLET | Freq: Two times a day (BID) | ORAL | 0 refills | Status: DC

## 2019-03-25 MED ORDER — MAGNESIUM SULFATE 2 GM/50ML IV SOLN
2.0000 g | Freq: Once | INTRAVENOUS | Status: AC
  Administered 2019-03-25: 2 g via INTRAVENOUS
  Filled 2019-03-25: qty 50

## 2019-03-25 MED ORDER — ALBUTEROL SULFATE HFA 108 (90 BASE) MCG/ACT IN AERS
8.0000 | INHALATION_SPRAY | RESPIRATORY_TRACT | Status: DC | PRN
  Administered 2019-03-25: 23:00:00 8 via RESPIRATORY_TRACT
  Filled 2019-03-25: qty 6.7

## 2019-03-25 MED ORDER — METHYLPREDNISOLONE SODIUM SUCC 125 MG IJ SOLR
125.0000 mg | Freq: Once | INTRAMUSCULAR | Status: AC
  Administered 2019-03-25: 125 mg via INTRAVENOUS
  Filled 2019-03-25: qty 2

## 2019-03-25 NOTE — ED Provider Notes (Signed)
[facility] EMERGENCY DEPARTMENT Provider Note   CSN: [id number] Arrival date & time: 03/25/19  2123    History   Chief Complaint Chief Complaint  Patient presents with  . Shortness of Breath    HPI Scott Patterson is a 33 y.o. male.     Patient is a 33 year old male with past medical history of asthma, anxiety, depression, and hypertension.  He presents today for evaluation of shortness of breath.  This is been ongoing for the past several weeks.  He was seen here previously approximately 1 week ago and prescribed steroids, however this is not helping.  This evening he felt as though his breathing was worse and presents for evaluation of it.  He denies any fevers, chills, or productive cough.  He denies any ill contacts or travel.  The history is provided by the patient.  Shortness of Breath  Severity:  Moderate Onset quality:  Gradual Duration:  3 weeks Timing:  Constant Progression:  Worsening Chronicity:  New Relieved by:  Nothing Worsened by:  Nothing Ineffective treatments:  Inhaler Associated symptoms: no chest pain, no fever and no sputum production     Past Medical History:  Diagnosis Date  . Anxiety   . Asthma   . Depression   . Hypertension     Patient Active Problem List   Diagnosis Date Noted  . Viral respiratory illness 03/13/2019  . New daily persistent headache 11/29/2018  . Bilateral knee pain 06/01/2018  . 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 Anxiety 02/06/2018  . Depression 01/11/2014  . Genital herpes 04/02/2011  . ESSENTIAL HYPERTENSION, BENIGN 12/11/2010  . OBESITY, NOS 02/09/2007  . Allergic rhinitis 02/09/2007  . Asthma, mild intermittent 02/09/2007    Past Surgical History:  Procedure Laterality Date  . None          Home Medications    Prior to Admission medications   Medication Sig Start Date End Date Taking? Authorizing Provider  albuterol (PROVENTIL HFA;VENTOLIN HFA) 108 (90 Base) MCG/ACT inhaler  Inhale 2 puffs into the lungs every 6 (six) hours as needed for wheezing or shortness of breath. 03/22/19   Westley Chandler, MD  cetirizine (ZYRTEC) 10 MG tablet Take 1 tablet (10 mg total) by mouth daily. 01/20/17   Bonney Aid, MD  diphenhydrAMINE (BENADRYL) 25 MG tablet Take 1 tablet (25 mg total) by mouth every 6 (six) hours. 10/28/18   Arby Barrette, MD  fluticasone (FLONASE) 50 MCG/ACT nasal spray USE 2 SPRAY(S) IN EACH NOSTRIL ONCE DAILY 03/22/19   Westley Chandler, MD  fluticasone furoate-vilanterol (BREO ELLIPTA) 100-25 MCG/INH AEPB Inhale 1 puff into the lungs daily. 03/22/19   Westley Chandler, MD  hydrochlorothiazide (HYDRODIURIL) 12.5 MG tablet Take 1 tablet (12.5 mg total) by mouth daily. 03/13/19   Moses Manners, MD  metoCLOPramide (REGLAN) 10 MG tablet Take 1 tablet (10 mg total) by mouth every 6 (six) hours. 1 tablet every 6-8 hours with Benadryl for headache. 10/28/18   Arby Barrette, MD  montelukast (SINGULAIR) 10 MG tablet Take 1 tablet (10 mg total) by mouth at bedtime. 03/22/19   Westley Chandler, MD  naproxen (NAPROSYN) 500 MG tablet Take 1 tablet (500 mg total) by mouth 2 (two) times daily. 10/28/18   Arby Barrette, MD  prochlorperazine (COMPAZINE) 10 MG tablet Take 1 tablet (10 mg total) by mouth once for 1 dose. 09/27/18 09/27/18  Curatolo, Adam, DO  sertraline (ZOLOFT) 50 MG tablet TAKE 1 TABLET BY MOUTH  EVERY DAY 03/19/19   Shirley, [place], DO  Spacer/Aero-Hold Chamber Bags MISC Use with inhaler 03/22/19   Westley ChandlerBrown, Carina M, MD  traMADol (ULTRAM) 50 MG tablet Take 1 tablet (50 mg total) by mouth every 8 (eight) hours as needed. 11/28/18   Shirley, [place], DO    Family History Family History  Problem Relation Age of Onset  . Depression Mother   . Hypertension Mother   . Alcohol abuse Father   . Drug abuse Father   . Stroke Father   . Hypertension Father   . Alcohol abuse Paternal Uncle   . Hypertension Brother      Social History Social History   Tobacco Use  .  Smoking status: Never Smoker  . Smokeless tobacco: Never Used  Substance Use Topics  . Alcohol use: No  . Drug use: No     Allergies   Patient has no known allergies.   Review of Systems Review of Systems  Constitutional: Negative for fever.  Respiratory: Positive for shortness of breath. Negative for sputum production.   Cardiovascular: Negative for chest pain.  All other systems reviewed and are negative.    Physical Exam Updated Vital Signs BP (!) 154/99   Pulse (!) 108   Temp 97.9 F (36.6 C) (Oral)   Resp 12   SpO2 100%   Physical Exam Vitals signs and nursing note reviewed.  Constitutional:      General: He is not in acute distress.    Appearance: He is well-developed. He is not diaphoretic.  HENT:     Head: Normocephalic and atraumatic.  Neck:     Musculoskeletal: Normal range of motion and neck supple.  Cardiovascular:     Rate and Rhythm: Normal rate and regular rhythm.     Heart sounds: No murmur. No friction rub.  Pulmonary:     Effort: Pulmonary effort is normal. No respiratory distress.     Breath sounds: Normal breath sounds. No wheezing or rales.  Abdominal:     General: Bowel sounds are normal. There is no distension.     Palpations: Abdomen is soft.     Tenderness: There is no abdominal tenderness.  Musculoskeletal: Normal range of motion.     Right lower leg: No edema.     Left lower leg: No edema.  Skin:    General: Skin is warm and dry.  Neurological:     Mental Status: He is alert and oriented to person, place, and time.     Coordination: Coordination normal.      ED Treatments / Results  Labs (all labs ordered are listed, but only abnormal results are displayed) Labs Reviewed  BASIC METABOLIC PANEL  CBC WITH DIFFERENTIAL/PLATELET    EKG None  Radiology No results found.  Procedures Procedures (including critical care time)  Medications Ordered in ED Medications  albuterol (PROVENTIL HFA;VENTOLIN HFA) 108 (90 Base)  MCG/ACT inhaler 8 puff (has no administration in time range)  methylPREDNISolone sodium succinate (SOLU-MEDROL) 125 mg/2 mL injection 125 mg (has no administration in time range)  magnesium sulfate IVPB 2 g 50 mL (has no administration in time range)     Initial Impression / Assessment and Plan / ED Course  I have reviewed the triage vital signs and the nursing notes.  Pertinent labs & imaging results that were available during my care of the patient were reviewed by me and considered in my medical decision making (see chart for details).  Patient is a 33 year old male with history  of asthma presenting with complaints of shortness of breath and intermittent cough.  He was seen here recently with similar complaints, but did not improve with steroids and albuterol.  Patient was given albuterol by [facility] here in the ER along with magnesium and Solu-Medrol.  He had a chest x-ray showing no acute infiltrate and laboratory studies which are unremarkable.  Patient has no history of travel or contacts with covid, and I doubt this to be the issue.    He will be discharged with prednisone, continued use of his inhaler, and Zithromax in case patient is experiencing a bronchitis.  He is to return if symptoms worsen or change.  Final Clinical Impressions(s) / ED Diagnoses   Final diagnoses:  None    ED Discharge Orders    None       Geoffery Lyons, MD 03/25/19 2325

## 2019-03-25 NOTE — Discharge Instructions (Addendum)
Doxycycline and prednisone as prescribed.  Continue use of your inhaler, 2 puffs every 4 hours as needed.  Follow-up with your primary doctor if symptoms or not improving in the next 3 to 4 days.

## 2019-03-25 NOTE — ED Triage Notes (Signed)
Onset several weeks shortness of breath, intermittant cough x 1 week, "heart pounding".   Talking in complete sentences.

## 2019-04-21 ENCOUNTER — Other Ambulatory Visit: Payer: Self-pay | Admitting: Family Medicine

## 2019-05-10 ENCOUNTER — Encounter (HOSPITAL_BASED_OUTPATIENT_CLINIC_OR_DEPARTMENT_OTHER): Payer: Self-pay | Admitting: *Deleted

## 2019-05-10 ENCOUNTER — Other Ambulatory Visit: Payer: Self-pay

## 2019-05-10 ENCOUNTER — Emergency Department (HOSPITAL_BASED_OUTPATIENT_CLINIC_OR_DEPARTMENT_OTHER)
Admission: EM | Admit: 2019-05-10 | Discharge: 2019-05-10 | Disposition: A | Discharge status: Home / Self Care | Payer: BLUE CROSS/BLUE SHIELD | Attending: Emergency Medicine | Admitting: Emergency Medicine

## 2019-05-10 DIAGNOSIS — Z79899 Other long term (current) drug therapy: Secondary | ICD-10-CM | POA: Insufficient documentation

## 2019-05-10 DIAGNOSIS — Y9389 Activity, other specified: Secondary | ICD-10-CM | POA: Diagnosis not present

## 2019-05-10 DIAGNOSIS — I10 Essential (primary) hypertension: Secondary | ICD-10-CM | POA: Insufficient documentation

## 2019-05-10 DIAGNOSIS — S6991XA Unspecified injury of right wrist, hand and finger(s), initial encounter: Secondary | ICD-10-CM | POA: Diagnosis present

## 2019-05-10 DIAGNOSIS — W25XXXA Contact with sharp glass, initial encounter: Secondary | ICD-10-CM | POA: Insufficient documentation

## 2019-05-10 DIAGNOSIS — J45909 Unspecified asthma, uncomplicated: Secondary | ICD-10-CM | POA: Diagnosis not present

## 2019-05-10 DIAGNOSIS — Y929 Unspecified place or not applicable: Secondary | ICD-10-CM | POA: Diagnosis not present

## 2019-05-10 DIAGNOSIS — S61511A Laceration without foreign body of right wrist, initial encounter: Secondary | ICD-10-CM | POA: Insufficient documentation

## 2019-05-10 DIAGNOSIS — Y999 Unspecified external cause status: Secondary | ICD-10-CM | POA: Diagnosis not present

## 2019-05-10 NOTE — ED Triage Notes (Signed)
Window was jammed and he hit the glass with his hand causing the glass to break and laceration to his right wrist. Bleeding controlled. 1" laceration noted.

## 2019-05-10 NOTE — ED Provider Notes (Signed)
[facility] EMERGENCY DEPARTMENT Provider Note   CSN: [id number] Arrival date & time: 05/10/19  1256    History   Chief Complaint Chief Complaint  Patient presents with  . Wrist Injury    HPI Jamonte L Treas is a 33 y.o. male.     The history is provided by the patient and medical records. No language interpreter was used.  Wrist Injury   Lander L Brendel is a 33 y.o. male who presents to the emergency department for concerns of laceration to the right wrist which occurred last night around 6 PM.  Patient states he was trying to open a jammed window when the glass broke and cut him on the wrist.  He has washed it with soap and water several times.  He thought that it would be fine without intervention, when he awoke this morning, he thought it still looked a little to open and came to ER for evaluation.  He denies any arthralgias or pain of the wrist or hand.  No pain or difficulty with range of motion of the upper extremity.  No numbness or weakness.  Denies any drainage from the wound.  States that bleeding was controlled rather quickly with direct pressure.  Believes tetanus is up-to-date.  Past Medical History:  Diagnosis Date  . Anxiety   . Asthma   . Depression   . Hypertension     Patient Active Problem List   Diagnosis Date Noted  . Viral respiratory illness 03/13/2019  . New daily persistent headache 11/29/2018  . Bilateral knee pain 06/01/2018  . 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 Anxiety 02/06/2018  . Depression 01/11/2014  . Genital herpes 04/02/2011  . ESSENTIAL HYPERTENSION, BENIGN 12/11/2010  . OBESITY, NOS 02/09/2007  . Allergic rhinitis 02/09/2007  . Asthma, mild intermittent 02/09/2007    Past Surgical History:  Procedure Laterality Date  . None          Home Medications    Prior to Admission medications   Medication Sig Start Date End Date Taking? Authorizing Provider  albuterol (PROVENTIL HFA;VENTOLIN HFA) 108 (90 Base) MCG/ACT  inhaler Inhale 2 puffs into the lungs every 6 (six) hours as needed for wheezing or shortness of breath. 03/22/19   Westley Chandler, MD  cetirizine (ZYRTEC) 10 MG tablet Take 1 tablet (10 mg total) by mouth daily. 01/20/17   Bonney Aid, MD  diphenhydrAMINE (BENADRYL) 25 MG tablet Take 1 tablet (25 mg total) by mouth every 6 (six) hours. Patient taking differently: Take 25 mg by mouth every 6 (six) hours as needed for itching or allergies.  10/28/18   Arby Barrette, MD  doxycycline (VIBRAMYCIN) 100 MG capsule Take 1 capsule (100 mg total) by mouth 2 (two) times daily. One po bid x 7 days 03/25/19   Geoffery Lyons, MD  fluticasone (FLONASE) 50 MCG/ACT nasal spray USE 2 SPRAY(S) IN EACH NOSTRIL ONCE DAILY Patient taking differently: Place 1 spray into both nostrils every morning.  03/22/19   Westley Chandler, MD  fluticasone furoate-vilanterol (BREO ELLIPTA) 100-25 MCG/INH AEPB Inhale 1 puff into the lungs daily. 03/22/19   Westley Chandler, MD  hydrochlorothiazide (HYDRODIURIL) 12.5 MG tablet Take 1 tablet by mouth once daily 04/23/19   Shirley, [place], DO  metoCLOPramide (REGLAN) 10 MG tablet Take 1 tablet (10 mg total) by mouth every 6 (six) hours. 1 tablet every 6-8 hours with Benadryl for headache. Patient taking differently: Take 10 mg by mouth See admin instructions. 1 tablet every  6-8 hours with Benadryl for headache. 10/28/18   Arby Barrette, MD  montelukast (SINGULAIR) 10 MG tablet Take 1 tablet (10 mg total) by mouth at bedtime. Patient not taking: Reported on 03/25/2019 03/22/19   Westley Chandler, MD  naproxen (NAPROSYN) 500 MG tablet Take 1 tablet (500 mg total) by mouth 2 (two) times daily. Patient not taking: Reported on 03/25/2019 10/28/18   Arby Barrette, MD  predniSONE (DELTASONE) 10 MG tablet Take 2 tablets (20 mg total) by mouth 2 (two) times daily with a meal. 03/25/19   Geoffery Lyons, MD  prochlorperazine (COMPAZINE) 10 MG tablet Take 1 tablet (10 mg total) by mouth once for 1 dose.  09/27/18 09/27/18  Curatolo, Adam, DO  sertraline (ZOLOFT) 50 MG tablet TAKE 1 TABLET BY MOUTH EVERY DAY Patient taking differently: Take 50 mg by mouth at bedtime.  03/19/19   Shirley, [place], DO  Spacer/Aero-Hold Chamber Bags MISC Use with inhaler 03/22/19   Westley Chandler, MD  traMADol (ULTRAM) 50 MG tablet Take 1 tablet (50 mg total) by mouth every 8 (eight) hours as needed. Patient taking differently: Take 50 mg by mouth every 12 (twelve) hours as needed for moderate pain.  11/28/18   Shirley, [place], DO    Family History Family History  Problem Relation Age of Onset  . Depression Mother   . Hypertension Mother   . Alcohol abuse Father   . Drug abuse Father   . Stroke Father   . Hypertension Father   . Alcohol abuse Paternal Uncle   . Hypertension Brother     Social History Social History   Tobacco Use  . Smoking status: Never Smoker  . Smokeless tobacco: Never Used  Substance Use Topics  . Alcohol use: No  . Drug use: No     Allergies   Patient has no known allergies.   Review of Systems Review of Systems  Musculoskeletal: Negative for arthralgias and myalgias.  Skin: Positive for wound.  Neurological: Negative for weakness and numbness.     Physical Exam Updated Vital Signs BP 129/87   Pulse 90   Temp 98.5 F (36.9 C) (Oral)   Resp 18   Ht  (1.88 m)   Wt 111 kg   SpO2 100%   BMI 31.42 kg/m   Physical Exam Vitals signs and nursing note reviewed.  Constitutional:      General: He is not in acute distress.    Appearance: He is well-developed.  HENT:     Head: Normocephalic and atraumatic.  Neck:     Musculoskeletal: Neck supple.  Cardiovascular:     Rate and Rhythm: Normal rate and regular rhythm.     Heart sounds: Normal heart sounds. No murmur.  Pulmonary:     Effort: Pulmonary effort is normal. No respiratory distress.     Breath sounds: Normal breath sounds. No wheezing or rales.  Musculoskeletal:     Comments: Full range of motion  and 5/5 muscle strength including grip strength of the right hand/wrist.  2+ radial pulse.  Good cap refill.  Sensation intact to radial, median and ulnar nerve distributions.  No tenderness whatsoever of the wrist or hand.  Skin:    General: Skin is warm and dry.     Comments: 2 cm linear laceration across the right wrist which is quite superficial.  Neurological:     Mental Status: He is alert.      ED Treatments / Results  Labs (all labs ordered are listed,  but only abnormal results are displayed) Labs Reviewed - No data to display  EKG None  Radiology No results found.  Procedures Procedures (including critical care time)  Medications Ordered in ED Medications - No data to display   Initial Impression / Assessment and Plan / ED Course  I have reviewed the triage vital signs and the nursing notes.  Pertinent labs & imaging results that were available during my care of the patient were reviewed by me and considered in my medical decision making (see chart for details).       Yuuki L Manson PasseyBrown is a 33 y.o. male who presents to ED for laceration to the wrist which occurred at 6 PM last night.  Denies any arthralgias or myalgias, concerned about laceration itself.  He has no bony tenderness to suggest underlying musculoskeletal injury.  Do not feel imaging is warranted.  Neurovascularly intact on exam.  Has a very superficial linear 2 cm laceration to the right wrist without any signs of infection.  Given the duration since injury as well as superficial nature of the wound, feel that letting this heal without intervention is best course.  Discussed home wound care instructions.  Monitor for signs of infection which were discussed.  Reasons return to ER discussed and all questions answered.  Final Clinical Impressions(s) / ED Diagnoses   Final diagnoses:  Laceration of right wrist, initial encounter    ED Discharge Orders    None       , Chase PicketJaime Pilcher, PA-C 05/10/19  1348    Arby BarrettePfeiffer, Marcy, MD 05/25/19 1431

## 2019-05-10 NOTE — Discharge Instructions (Signed)
It was my pleasure taking care of you today!   As we discussed, clean wound at least twice a day.  Dry well and apply topical antibiotic ointment which you can buy over-the-counter (such as Neosporin).   Follow-up with your doctor if you notice any signs of infection such as redness around the wound.  You can also return to the ER if this occurs or if you have any new symptoms or additional concerns.

## 2019-05-16 ENCOUNTER — Encounter: Payer: BLUE CROSS/BLUE SHIELD | Admitting: Family Medicine

## 2019-05-23 ENCOUNTER — Encounter: Payer: BLUE CROSS/BLUE SHIELD | Admitting: Family Medicine

## 2019-06-20 ENCOUNTER — Other Ambulatory Visit: Payer: Self-pay

## 2019-06-20 MED ORDER — ALBUTEROL SULFATE HFA 108 (90 BASE) MCG/ACT IN AERS: 2.0000 | INHALATION_SPRAY | Freq: Four times a day (QID) | RESPIRATORY_TRACT | 0 refills | Status: DC | PRN

## 2019-06-20 MED ORDER — FLUTICASONE PROPIONATE 50 MCG/ACT NA SUSP: 1.0000 | NASAL | 0 refills | Status: DC

## 2019-06-20 MED ORDER — SERTRALINE HCL 50 MG PO TABS: 50.0000 mg | ORAL_TABLET | Freq: Every day | ORAL | 0 refills | Status: DC

## 2019-06-20 NOTE — Telephone Encounter (Signed)
Patient calls nurse line stating his new insurance is [employer], which we do not accept anymore. Patient does have an apt for August with a new provider. However, they will not fill his meds until he is seen. Patient asking for refills to get him to new PCP apt.

## 2019-06-20 NOTE — Telephone Encounter (Signed)
Patient requesting refills before he sees new PCP in August due to insurance change in coverage.

## 2019-06-25 ENCOUNTER — Other Ambulatory Visit: Payer: Self-pay | Admitting: *Deleted

## 2019-06-25 MED ORDER — FLUTICASONE PROPIONATE 50 MCG/ACT NA SUSP: 1.0000 | NASAL | 0 refills | Status: DC

## 2019-06-26 ENCOUNTER — Other Ambulatory Visit: Payer: Self-pay | Admitting: *Deleted

## 2019-06-26 MED ORDER — MONTELUKAST SODIUM 10 MG PO TABS: 10.0000 mg | ORAL_TABLET | Freq: Every day | ORAL | 1 refills | Status: DC

## 2019-06-26 MED ORDER — ALBUTEROL SULFATE HFA 108 (90 BASE) MCG/ACT IN AERS: 2.0000 | INHALATION_SPRAY | Freq: Four times a day (QID) | RESPIRATORY_TRACT | 1 refills | Status: DC | PRN

## 2019-06-26 MED ORDER — FLUTICASONE PROPIONATE 50 MCG/ACT NA SUSP: 1.0000 | NASAL | 1 refills | Status: DC

## 2019-06-26 MED ORDER — HYDROCHLOROTHIAZIDE 12.5 MG PO TABS: 12.5000 mg | ORAL_TABLET | Freq: Every day | ORAL | 1 refills | Status: DC

## 2019-06-26 NOTE — Telephone Encounter (Signed)
Pt calls and is upset because his meds went to different places and now he is out of HCTZ and needs to head back to [place] for his job.    Advised that the confusion probably came in because of all the pharmacies he has in his chart.  Deleted all, with his permission, except for [facility] [address].  Will resent Flonase, HCTZ montelukast and albuterol since pt is running out of time and medication.  He has [place] insurance and has appt on 08/03/19.  Will send meds for 2 months to get him through.  Pt appreciative. Christen Bame, CMA

## 2019-07-09 ENCOUNTER — Other Ambulatory Visit: Payer: Self-pay | Admitting: *Deleted

## 2019-07-09 MED ORDER — BREO ELLIPTA 100-25 MCG/INH IN AEPB: 1.0000 | INHALATION_SPRAY | Freq: Every day | RESPIRATORY_TRACT | 3 refills | Status: DC

## 2019-07-11 MED ORDER — MONTELUKAST SODIUM 10 MG PO TABS: 10.0000 mg | ORAL_TABLET | Freq: Every day | ORAL | 1 refills | Status: DC

## 2019-07-11 NOTE — Addendum Note (Signed)
Addended by: Christen Bame D on: 07/11/2019 09:53 AM   Modules accepted: Orders

## 2019-07-11 NOTE — Telephone Encounter (Signed)
Pt calls again, he never went to pick up meds from [facility] [address].  He is now in [place] and needs the script sent there, cant get the script transferred.  Resent. Christen Bame, CMA

## 2019-07-16 ENCOUNTER — Other Ambulatory Visit: Payer: Self-pay | Admitting: Family Medicine

## 2019-07-18 ENCOUNTER — Other Ambulatory Visit: Payer: Self-pay | Admitting: Family Medicine

## 2019-07-27 ENCOUNTER — Other Ambulatory Visit: Payer: Self-pay

## 2019-07-27 MED ORDER — BREO ELLIPTA 100-25 MCG/INH IN AEPB: 1.0000 | INHALATION_SPRAY | Freq: Every day | RESPIRATORY_TRACT | 3 refills | Status: DC

## 2019-08-02 ENCOUNTER — Other Ambulatory Visit: Payer: Self-pay | Admitting: Family Medicine

## 2019-08-09 ENCOUNTER — Other Ambulatory Visit: Payer: Self-pay | Admitting: Family Medicine

## 2019-09-19 ENCOUNTER — Encounter (HOSPITAL_BASED_OUTPATIENT_CLINIC_OR_DEPARTMENT_OTHER): Payer: Self-pay

## 2019-09-19 ENCOUNTER — Other Ambulatory Visit: Payer: Self-pay

## 2019-09-19 ENCOUNTER — Emergency Department (HOSPITAL_BASED_OUTPATIENT_CLINIC_OR_DEPARTMENT_OTHER)
Admission: EM | Admit: 2019-09-19 | Discharge: 2019-09-19 | Disposition: A | Discharge status: Home / Self Care | Payer: BLUE CROSS/BLUE SHIELD | Attending: Emergency Medicine | Admitting: Emergency Medicine

## 2019-09-19 DIAGNOSIS — J0100 Acute maxillary sinusitis, unspecified: Secondary | ICD-10-CM | POA: Diagnosis not present

## 2019-09-19 DIAGNOSIS — J45909 Unspecified asthma, uncomplicated: Secondary | ICD-10-CM | POA: Diagnosis not present

## 2019-09-19 DIAGNOSIS — J069 Acute upper respiratory infection, unspecified: Secondary | ICD-10-CM | POA: Diagnosis not present

## 2019-09-19 DIAGNOSIS — Z79899 Other long term (current) drug therapy: Secondary | ICD-10-CM | POA: Insufficient documentation

## 2019-09-19 DIAGNOSIS — I10 Essential (primary) hypertension: Secondary | ICD-10-CM | POA: Diagnosis not present

## 2019-09-19 DIAGNOSIS — R519 Headache, unspecified: Secondary | ICD-10-CM | POA: Diagnosis present

## 2019-09-19 MED ORDER — AMOXICILLIN-POT CLAVULANATE 875-125 MG PO TABS: 1.0000 | ORAL_TABLET | Freq: Two times a day (BID) | ORAL | 0 refills | Status: AC

## 2019-09-19 NOTE — ED Provider Notes (Signed)
[facility] EMERGENCY DEPARTMENT Provider Note   CSN: [id number] Arrival date & time: 09/19/19  1115     History   Chief Complaint Chief Complaint  Patient presents with  . Oral Swelling    HPI Scott Patterson is a 33 y.o. male with history of asthma, hypertension, obesity presents today for multiple complaints.  Patient's primary complaint today is of a sinus infection.  Patient reports 7-8 days of sinus pain.  He describes a full/pressure-like sensation in the front of his face moderate in intensity constant, nonradiating and without aggravating factors.  He reports he has been using Flonase and Zyrtec without relief.  Associated symptoms of rhinorrhea, clear yellow-colored.  Patient secondary concern today is pink bumps on the back of his tongue.  He reports that he has had postnasal drip for the past few days and 2 days ago he noticed a small pink bumps on the back of his tongue which she has never noticed before.  He reports they are not painful and have not changed in the past 2 days.  Patient denies fever/chills, headache/vision changes, sore throat, difficulty swallowing, difficulty breathing, swelling of the neck, trismus, cough/shortness of breath, chest pain, nausea/vomiting, diarrhea, abdominal pain, rash, new foods, new environmental exposures or bug bites or any additional concerns.  Of note patient tolerating p.o. without difficulty, eating in lobby.     HPI  Past Medical History:  Diagnosis Date  . Anxiety   . Asthma   . Depression   . Hypertension     Patient Active Problem List   Diagnosis Date Noted  . Viral respiratory illness 03/13/2019  . New daily persistent headache 11/29/2018  . Bilateral knee pain 06/01/2018  . 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 Anxiety 02/06/2018  . Depression 01/11/2014  . Genital herpes 04/02/2011  . ESSENTIAL HYPERTENSION, BENIGN 12/11/2010  . OBESITY, NOS 02/09/2007  . Allergic rhinitis 02/09/2007  . Asthma,  mild intermittent 02/09/2007    Past Surgical History:  Procedure Laterality Date  . None          Home Medications    Prior to Admission medications   Medication Sig Start Date End Date Taking? Authorizing Provider  amoxicillin-clavulanate (AUGMENTIN) 875-125 MG tablet Take 1 tablet by mouth every 12 (twelve) hours for 7 days. 09/19/19 09/26/19  Harlene Salts A, PA-C  cetirizine (ZYRTEC) 10 MG tablet Take 1 tablet (10 mg total) by mouth daily. 01/20/17   Bonney Aid, MD  diphenhydrAMINE (BENADRYL) 25 MG tablet Take 1 tablet (25 mg total) by mouth every 6 (six) hours. Patient taking differently: Take 25 mg by mouth every 6 (six) hours as needed for itching or allergies.  10/28/18   Arby Barrette, MD  fluticasone (FLONASE) 50 MCG/ACT nasal spray USE 1 SPRAY INTO BOTH NOSTRILS EVERY MORNING. 08/09/19   Talbert Forest, [place], DO  fluticasone furoate-vilanterol (BREO ELLIPTA) 100-25 MCG/INH AEPB Inhale 1 puff into the lungs daily. 07/27/19   Shirley, [place], DO  hydrochlorothiazide (HYDRODIURIL) 12.5 MG tablet TAKE 1 TABLET BY MOUTH EVERY DAY 07/18/19   Shirley, [place], DO  metoCLOPramide (REGLAN) 10 MG tablet Take 1 tablet (10 mg total) by mouth every 6 (six) hours. 1 tablet every 6-8 hours with Benadryl for headache. Patient taking differently: Take 10 mg by mouth See admin instructions. 1 tablet every 6-8 hours with Benadryl for headache. 10/28/18   Arby Barrette, MD  montelukast (SINGULAIR) 10 MG tablet TAKE 1 TABLET BY MOUTH EVERYDAY AT BEDTIME 08/02/19   Shirley, [place],  DO  naproxen (NAPROSYN) 500 MG tablet Take 1 tablet (500 mg total) by mouth 2 (two) times daily. Patient not taking: Reported on 03/25/2019 10/28/18   Arby Barrette, MD  predniSONE (DELTASONE) 10 MG tablet Take 2 tablets (20 mg total) by mouth 2 (two) times daily with a meal. 03/25/19   Geoffery Lyons, MD  prochlorperazine (COMPAZINE) 10 MG tablet Take 1 tablet (10 mg total) by mouth once for 1 dose. 09/27/18 09/27/18   Curatolo, Adam, DO  sertraline (ZOLOFT) 50 MG tablet Take 1 tablet (50 mg total) by mouth at bedtime. 06/20/19   Shirley, [place], DO  Spacer/Aero-Hold Chamber Bags MISC Use with inhaler 03/22/19   Westley Chandler, MD  traMADol (ULTRAM) 50 MG tablet Take 1 tablet (50 mg total) by mouth every 8 (eight) hours as needed. Patient taking differently: Take 50 mg by mouth every 12 (twelve) hours as needed for moderate pain.  11/28/18   Shirley, [place], DO  VENTOLIN HFA 108 (90 Base) MCG/ACT inhaler TAKE 2 PUFFS BY MOUTH EVERY 6 HOURS AS NEEDED FOR WHEEZE OR SHORTNESS OF BREATH 07/16/19   Shirley, [place], DO  fluticasone [facility]) 50 MCG/ACT nasal spray Place 1 spray into both nostrils every morning. 06/26/19   Shirley, [place], DO  fluticasone [facility]) 50 MCG/ACT nasal spray PLACE 1 SPRAY INTO BOTH NOSTRILS EVERY MORNING. 08/02/19   Shirley, [place], DO  montelukast (SINGULAIR) 10 MG tablet Take 1 tablet (10 mg total) by mouth at bedtime. 07/11/19   Shirley, [place], DO    Family History Family History  Problem Relation Age of Onset  . Depression Mother   . Hypertension Mother   . Alcohol abuse Father   . Drug abuse Father   . Stroke Father   . Hypertension Father   . Alcohol abuse Paternal Uncle   . Hypertension Brother     Social History Social History   Tobacco Use  . Smoking status: Never Smoker  . Smokeless tobacco: Never Used  Substance Use Topics  . Alcohol use: No  . Drug use: No     Allergies   Patient has no known allergies.   Review of Systems Review of Systems Ten systems are reviewed and are negative for acute change except as noted in the HPI   Physical Exam Updated Vital Signs BP 118/83 (BP Location: Left Arm)   Pulse 67   Temp 98.3 F (36.8 C) (Oral)   Resp 20   Ht  (1.88 m)   Wt 111.1 kg   SpO2 100%   BMI 31.46 kg/m   Physical Exam Constitutional:      General: He is not in acute distress.    Appearance: Normal appearance. He is well-developed. He is not  ill-appearing or diaphoretic.  HENT:     Head: Normocephalic and atraumatic. No raccoon eyes or Battle's sign.     Jaw: There is normal jaw occlusion. No trismus.     Right Ear: Tympanic membrane, ear canal and external ear normal.     Left Ear: Tympanic membrane, ear canal and external ear normal.     Nose: Rhinorrhea present. Rhinorrhea is clear.     Right Sinus: Maxillary sinus tenderness present. No frontal sinus tenderness.     Left Sinus: Maxillary sinus tenderness present. No frontal sinus tenderness.     Mouth/Throat:      Comments: Cobblestoning of the posterior oropharynx consistent with postnasal drip.  Patient with multiple prominent vallate papilla on the posterior tongue without drainage, erythema or  induration, appear physiologic.  The patient has normal phonation and is in control of secretions. No stridor.  Midline uvula without edema. Soft palate rises symmetrically. No tonsillar erythema, swelling or exudates. Tongue protrusion is normal, floor of mouth is soft. No trismus. No creptius on neck palpation. No gingival erythema or fluctuance noted. Mucus membranes moist. Eyes:     General: Vision grossly intact. Gaze aligned appropriately.     Extraocular Movements: Extraocular movements intact.     Conjunctiva/sclera: Conjunctivae normal.     Pupils: Pupils are equal, round, and reactive to light.     Comments: No pain with extraocular motion  Neck:     Musculoskeletal: Full passive range of motion without pain, normal range of motion and neck supple. No neck rigidity or crepitus.     Trachea: Trachea and phonation normal. No tracheal tenderness or tracheal deviation.     Meningeal: Brudzinski's sign absent.  Pulmonary:     Effort: Pulmonary effort is normal. No accessory muscle usage or respiratory distress.  Abdominal:     General: There is no distension.     Palpations: Abdomen is soft.     Tenderness: There is no abdominal tenderness. There is no guarding or  rebound.  Musculoskeletal: Normal range of motion.  Skin:    General: Skin is warm and dry.     Findings: No rash.  Neurological:     Mental Status: He is alert.     GCS: GCS eye subscore is 4. GCS verbal subscore is 5. GCS motor subscore is 6.     Comments: Speech is clear and goal oriented, follows commands Major Cranial nerves without deficit, no facial droop Moves extremities without ataxia, coordination intact  Psychiatric:        Behavior: Behavior normal.    ED Treatments / Results  Labs (all labs ordered are listed, but only abnormal results are displayed) Labs Reviewed  SARS CORONAVIRUS 2 (TAT 6-24 HRS)    EKG None  Radiology No results found.  Procedures Procedures (including critical care time)  Medications Ordered in ED Medications - No data to display   Initial Impression / Assessment and Plan / ED Course  I have reviewed the triage vital signs and the nursing notes.  Pertinent labs & imaging results that were available during my care of the patient were reviewed by me and considered in my medical decision making (see chart for details).    33 year old male presents today for concern of sinus infection and bumps on his tongue.  He is overall well-appearing and in no acute distress.  No signs of otitis, moderate amount of clear rhinorrhea present with postnasal drip.  There are some physiologic appearing papilla on the posterior tongue which patient seems to be referring to without signs of bacterial infection.  He has no sign of tonsillitis, PTA, RPA, Ludwig's, dental abscess or other deep space infections of the oropharynx or neck.  Patient is also complaining of approximately 8 days of sinus pressure, his mild maxillary tenderness to palpation.  No pain with extraocular motion, no signs of orbital or preseptal cellulitis.  Will treat patient with Augmentin for sinusitis today and encourage patient to continue using Zyrtec and Flonase OTC.  There are no signs of  allergic reaction or airway compromise no signs of angioedema, anaphylaxis or other acute pathologies at this time.  Patient will follow-up with his PCP before the end of the week for recheck and return to the emergency department immediately for any new  or worsening symptoms.  At this time there does not appear to be any evidence of an acute emergency medical condition and the patient appears stable for discharge with appropriate outpatient follow up. Diagnosis was discussed with patient who verbalizes understanding of care plan and is agreeable to discharge. I have discussed return precautions with patient who verbalizes understanding of return precautions. Patient encouraged to follow-up with their PCP. All questions answered.  Patient has been discharged in good condition.  Patient's case discussed with Dr. Melina Copa who agrees with plan to discharge with follow-up.   Scott Patterson was evaluated in Emergency Department on 09/19/2019 for the symptoms described in the history of present illness. He was evaluated in the context of the global COVID-19 pandemic, which necessitated consideration that the patient might be at risk for infection with the SARS-CoV-2 virus that causes COVID-19. Institutional protocols and algorithms that pertain to the evaluation of patients at risk for COVID-19 are in a state of rapid change based on information released by regulatory bodies including the CDC and federal and state organizations. These policies and algorithms were followed during the patient's care in the ED.  Note: Portions of this report may have been transcribed using voice recognition software. Every effort was made to ensure accuracy; however, inadvertent computerized transcription errors may still be present. Final Clinical Impressions(s) / ED Diagnoses   Final diagnoses:  Acute non-recurrent maxillary sinusitis  Viral upper respiratory tract infection    ED Discharge Orders         Ordered     amoxicillin-clavulanate (AUGMENTIN) 875-125 MG tablet  Every 12 hours     09/19/19 1227           Gari Crown 09/19/19 1233    Hayden Rasmussen, MD 09/19/19 1640

## 2019-09-19 NOTE — ED Triage Notes (Signed)
Pt c/o swelling and bumps to tongue x 2 days-also c/o sinus infection x 1 week-reports neg covid test 3 weeks ago-NAD-steady gait-pt was asked to not eat chips he bought at vending upon arrival to ED prior to exam

## 2019-09-19 NOTE — Discharge Instructions (Addendum)
You have been diagnosed today with sinus infection and upper respiratory tract infection.  At this time there does not appear to be the presence of an emergent medical condition, however there is always the potential for conditions to change. Please read and follow the below instructions.  Please return to the Emergency Department immediately for any new or worsening symptoms or if your symptoms do not improve within 3 days. Please be sure to follow up with your Primary Care Provider within one week regarding your visit today; please call their office to schedule an appointment even if you are feeling better for a follow-up visit. You have been tested today for the COVID-19 virus.  Your results will be available on your MyChart account in the next 2 days.  If your test is positive please continue your self quarantine for 2 weeks and symptom-free x7 days.  If your test is negative please continue your self quarantine until you are symptom-free for 7 days to avoid spread in case of a false negative test. Please take the antibiotic Augmentin as prescribed for treatment of sinus infection.  Please drink plenty of water to maintain your hydration.  You may continue using Zyrtec and Flonase as prescribed to help with your symptoms.  Please get plenty of rest. These small bumps on the back of your tongue appear to be taste buds otherwise known as papillae of the tongue.  Get help right away if: You have a very bad headache. You have very bad pain or swelling around your face or eyes. You have trouble seeing. You feel confused. Your neck is stiff. You have trouble breathing. You feel pain or pressure in your chest. You have shortness of breath. You faint or feel like you will faint. You throw up You feel confused. You develop a rash You have any new/concerning or worsening symptoms  Please read the additional information packets attached to your discharge summary.  Do not take your medicine if   develop an itchy rash, swelling in your mouth or lips, or difficulty breathing; call 911 and seek immediate emergency medical attention if this occurs.  Note: Portions of this text may have been transcribed using voice recognition software. Every effort was made to ensure accuracy; however, inadvertent computerized transcription errors may still be present.

## 2019-10-29 IMAGING — DX DG KNEE COMPLETE 4+V*R*
4 series · 4 of 4 positions shown · non-contrast
Comparison: None.

CLINICAL DATA: 32-year-old male with anterior pain with popping.
Initial encounter.

EXAM:
RIGHT KNEE - COMPLETE 4+ VIEW

[dg knee complete 4 views right (1 of 4)]
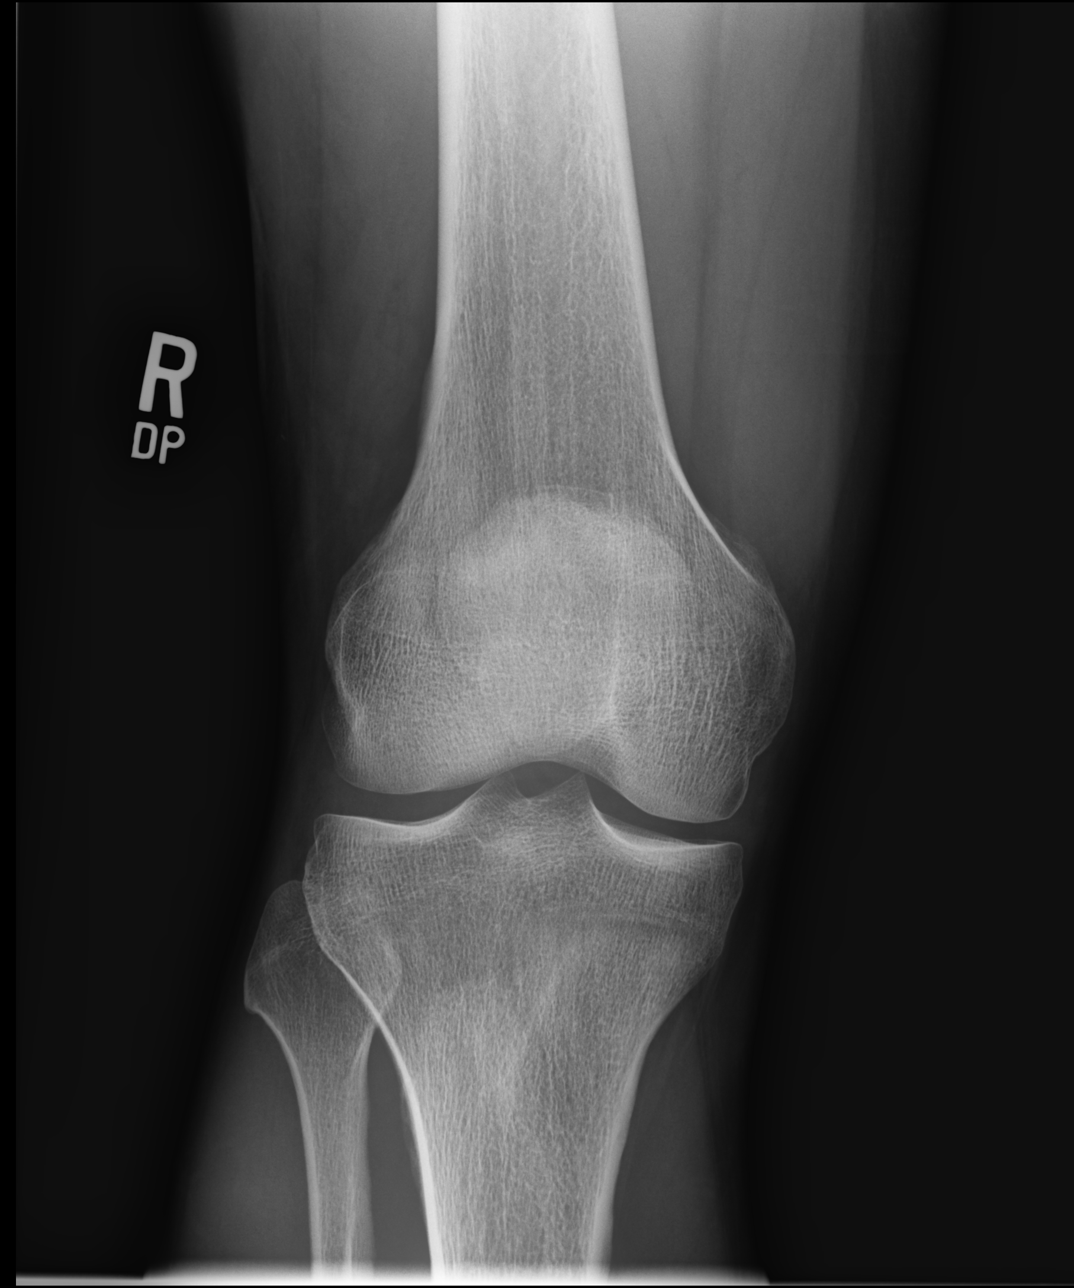

[dg knee complete 4 views right (2 of 4)]
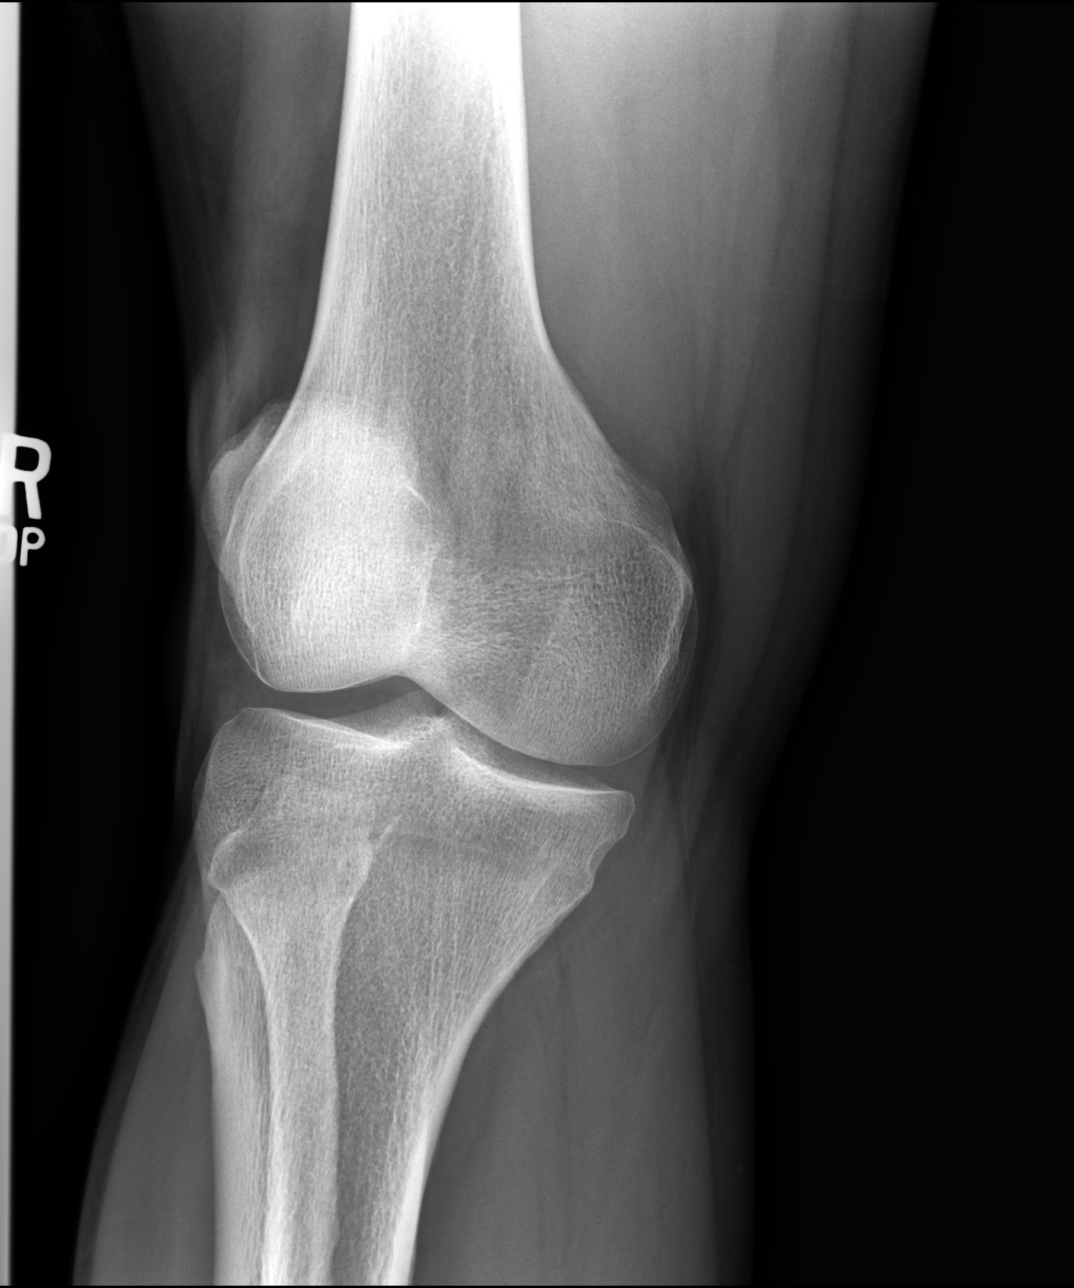

[dg knee complete 4 views right (3 of 4)]
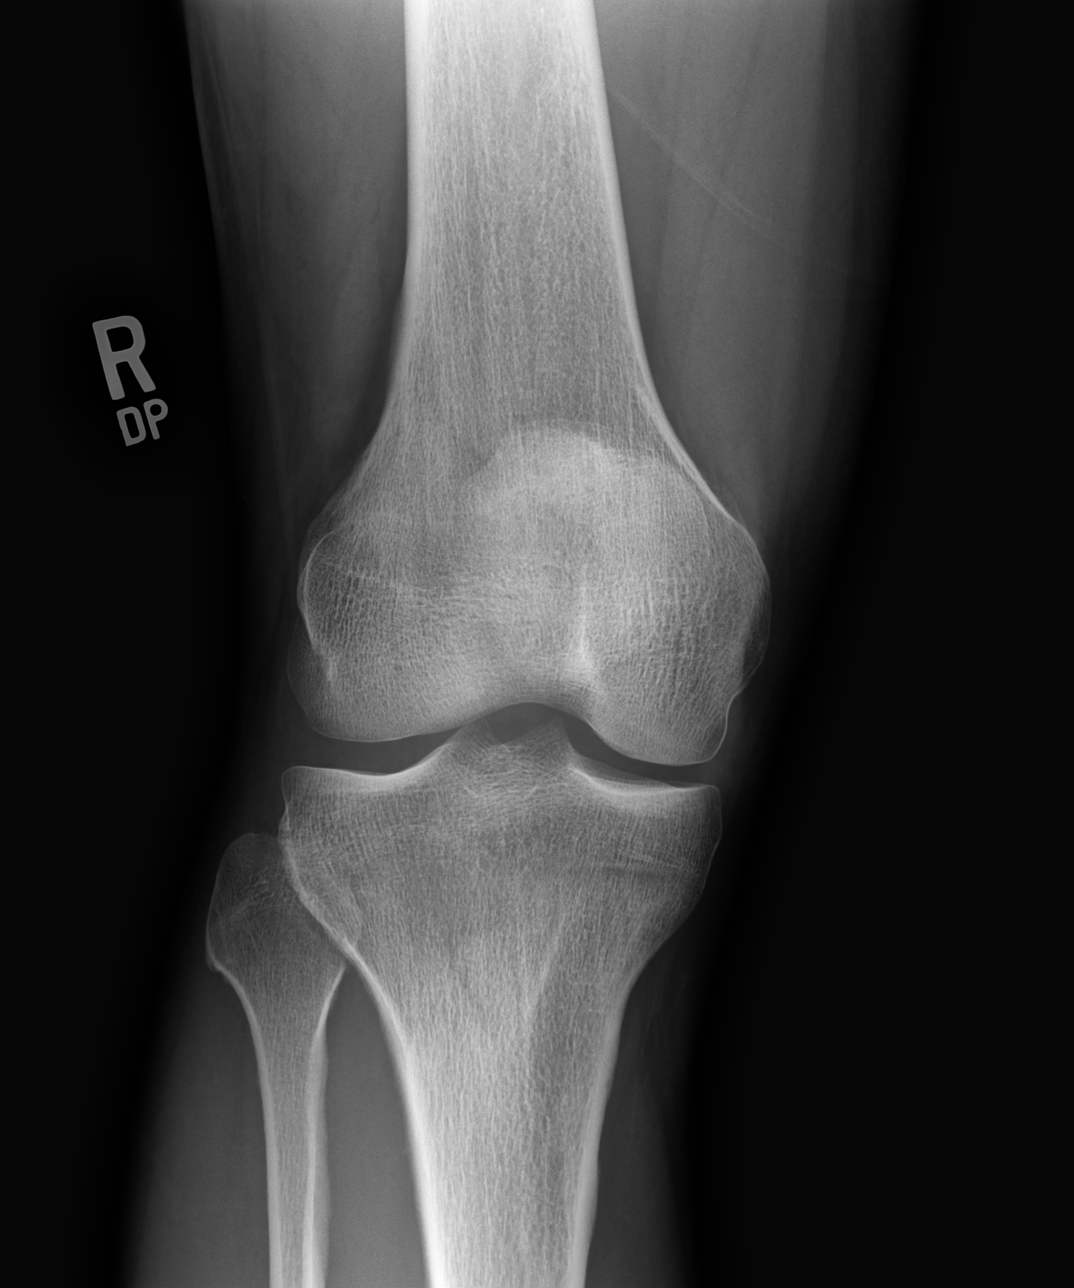

[dg knee complete 4 views right (4 of 4)]
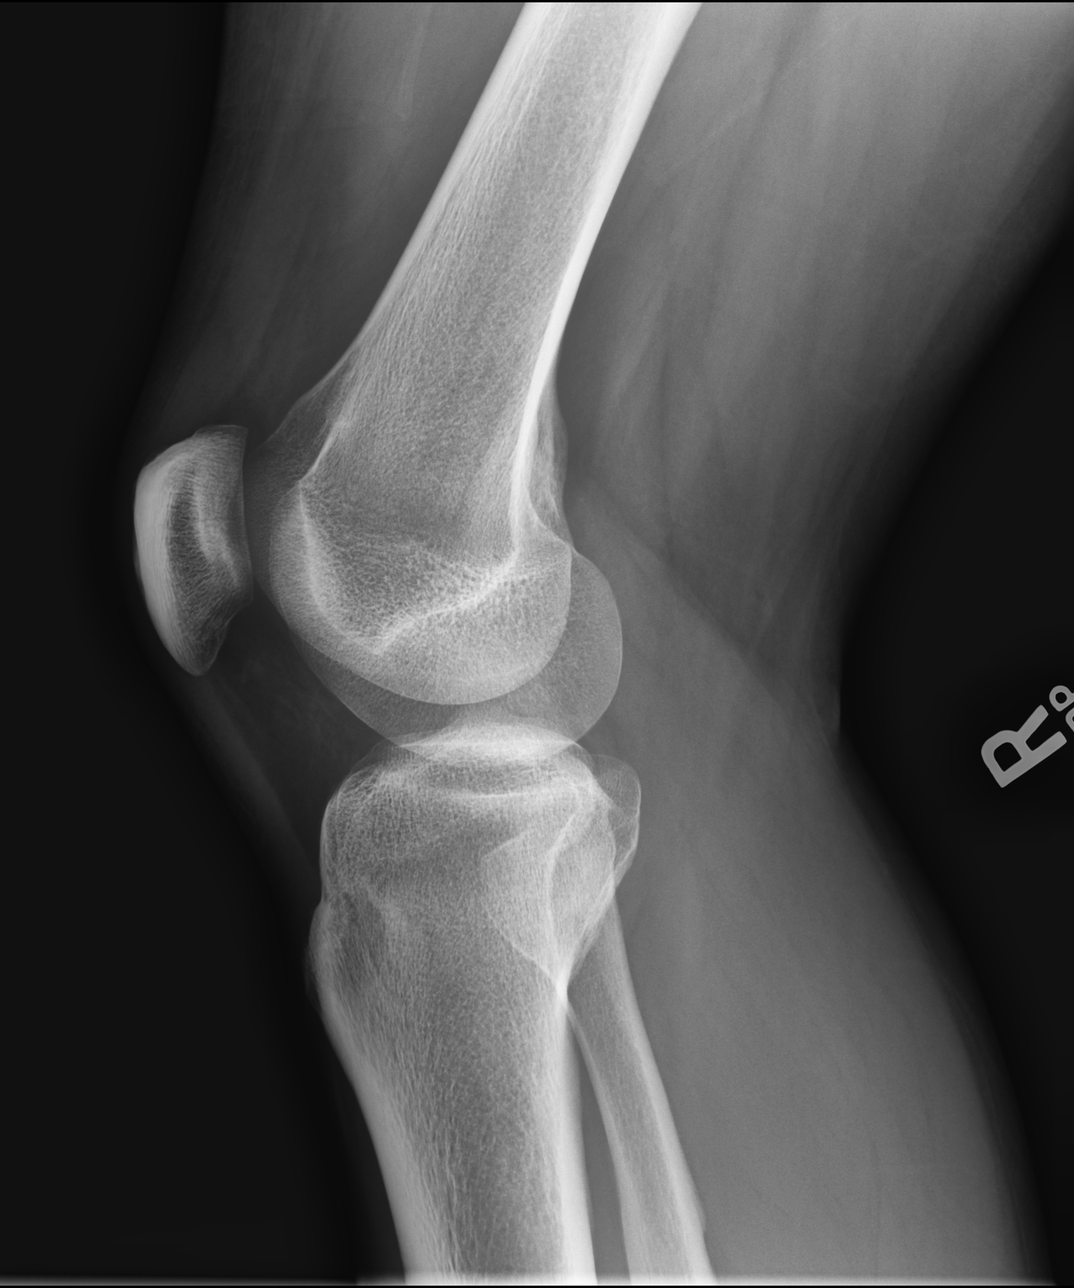

[4 of 4 positions shown; findings below may reference images not displayed]

FINDINGS: No evidence of fracture, dislocation, or joint effusion. No evidence
of arthropathy or other focal bone abnormality. Soft tissues are
unremarkable.
IMPRESSION: Negative.

## 2019-12-20 ENCOUNTER — Ambulatory Visit (INDEPENDENT_AMBULATORY_CARE_PROVIDER_SITE_OTHER): Payer: Self-pay | Admitting: Family Medicine

## 2019-12-20 ENCOUNTER — Other Ambulatory Visit: Payer: Self-pay

## 2019-12-20 VITALS — BP 106/74 | HR 82 | Wt 262.2 lb

## 2019-12-20 DIAGNOSIS — J4520 Mild intermittent asthma, uncomplicated: Secondary | ICD-10-CM

## 2019-12-20 DIAGNOSIS — J310 Chronic rhinitis: Secondary | ICD-10-CM

## 2019-12-20 DIAGNOSIS — L0292 Furuncle, unspecified: Secondary | ICD-10-CM

## 2019-12-20 DIAGNOSIS — I10 Essential (primary) hypertension: Secondary | ICD-10-CM

## 2019-12-20 DIAGNOSIS — J329 Chronic sinusitis, unspecified: Secondary | ICD-10-CM

## 2019-12-20 MED ORDER — ALBUTEROL SULFATE HFA 108 (90 BASE) MCG/ACT IN AERS: INHALATION_SPRAY | RESPIRATORY_TRACT | 0 refills | Status: DC

## 2019-12-20 MED ORDER — BREO ELLIPTA 100-25 MCG/INH IN AEPB: 1.0000 | INHALATION_SPRAY | Freq: Every day | RESPIRATORY_TRACT | 0 refills | Status: DC

## 2019-12-20 MED ORDER — HYDROCHLOROTHIAZIDE 12.5 MG PO TABS: 12.5000 mg | ORAL_TABLET | Freq: Every day | ORAL | 0 refills | Status: DC

## 2019-12-20 MED ORDER — AMOXICILLIN-POT CLAVULANATE 875-125 MG PO TABS: 1.0000 | ORAL_TABLET | Freq: Two times a day (BID) | ORAL | 0 refills | Status: AC

## 2019-12-20 MED ORDER — MONTELUKAST SODIUM 10 MG PO TABS: 10.0000 mg | ORAL_TABLET | Freq: Every day | ORAL | 1 refills | Status: DC

## 2019-12-20 NOTE — Patient Instructions (Signed)
Thank you for coming to see me today. It was a pleasure. Today we talked about:   Your skin:  Use warm compresses and neosporin from the pharmacy twice a day.  Keep your skin clean.  Your sinuses:  I sent an antibiotic to the pharmacy for you.  Use this for the next 7 days.  Continue to use Flonase every day and Zyrtec.  If you do not improve after antibiotics, please come back.  At that time we can consider giving further imaging or referring you to an ear nose and throat specialist.  Your blood pressure: I have sent a prescription to the pharmacy for hydrochlorothiazide, I have also ordered labs, please have these done today.  Please follow-up with your PCP at your convenience.  Please follow-up with your PCP in 1 month or sooner.  If you have any questions or concerns, please do not hesitate to call the office at [phone].  Best,   Luis Abed, DO

## 2019-12-20 NOTE — Progress Notes (Signed)
Subjective: Chief Complaint  Patient presents with  . Bump On Head     HPI: Scott Patterson is a 34 y.o. presenting to clinic today to discuss the following:  1  Bump on back of head Had them about a year ago, but came back in the last week.  Bump on head.  It is on the back of his head so he is unsure if it is red.  Tender to touch.  Thinks it has drained a little bit of pus/blood.    2 Sinus Congestion All winter has been using nasal spray and zyrtec every morning.  Having congestion, runny nose, a slight sore throat worse in the AM, itchy and watery eyes.  No fevers.  No productive cough.  Occasional dry cough.  Nasal mucus is green.  Has been tested twice for COVID "awhile ago" and both were negative.  Symptoms haven't gotten worse since then, they just don't go away.  No known COVID-19 contacts, or other sick contacts.  Girlfriend is an NP, works at a hospital in [place] and has not had COVID, gets tested frequently.  Has asthma, but has not had shortness of breath.  Takes the breo daily, needs ventolin at least twice a day.  Hasn't been taking singulair.  Requesting refill on breo, albuterol.  Hasn't taken singular in "a while."  3 HTN Needs refill of HCTZ  4 asthma Patient has been taking Breo daily, needs Ventolin at least twice daily per his report.  Denies shortness of breath at present.  Has not been taking his Singulair.  ROS noted in HPI. Chief complaint noted.  Other Pertinent PMH: Hypertension, asthma, anxiety, depression Past Medical, Surgical, Social, and Family History Reviewed & Updated per EMR.      Social History   Tobacco Use  Smoking Status Never Smoker  Smokeless Tobacco Never Used   Smoking status noted.    Objective: BP 106/74   Pulse 82   Wt 262 lb 3.2 oz (118.9 kg)   SpO2 96%   BMI 33.66 kg/m  Vitals and nursing notes reviewed  Physical Exam:  General: 34 y.o. male in NAD HEENT: Nasal turbinates swollen and erythematous, throat with  cobblestoning and postnasal drip Neck: No cervical lymphadenopathy Cardio: RRR no m/r/g Lungs: CTAB, no wheezing, no rhonchi, no crackles, no IWOB on RA Skin: warm and dry, furuncle on left occipital scalp Extremities: No edema   Results for orders placed or performed in visit on 12/20/19 (from the past 72 hour(s))  Basic Metabolic Panel     Status: Abnormal   Collection Time: 12/20/19 10:02 AM  Result Value Ref Range   Glucose 97 65 - 99 mg/dL   BUN 15 6 - 20 mg/dL   Creatinine, Ser 0.09 (H) 0.76 - 1.27 mg/dL   GFR calc non Af Amer 68 >59 mL/min/1.73   GFR calc Af Amer 79 >59 mL/min/1.73   BUN/Creatinine Ratio 11 9 - 20   Sodium 138 134 - 144 mmol/L   Potassium 5.1 3.5 - 5.2 mmol/L   Chloride 101 96 - 106 mmol/L   CO2 24 20 - 29 mmol/L   Calcium 9.7 8.7 - 10.2 mg/dL    Assessment/Plan:  Furuncle Furuncle noted on scalp.  Did appear to have scab over it that was already draining some.  Did not seem obviously fluctuant.  No surrounding erythema.  Advised patient to use warm compresses and use Neosporin over-the-counter.  If no improvement or worsening in the  next few weeks, to return to care.  Rhinosinusitis Patient reports that he has been tested for Covid during the time of the symptoms and has been negative x2.  Cannot find this in the chart, but patient seems reliable.  Symptoms also have been going on for a few months per his report, therefore doubt COVID-19.  It sounds as though he does have allergies, and has been taking his allergy medication.  He has also been using Flonase for the last few months.  Given this and no improvement, discussed with Dr. Dione Plover,, agreed to treat patient for bacterial rhinosinusitis.  Given scription for Augmentin x 7 days.  If no improvement, patient to return to care.  Could consider at that time getting a CT maxillofacial sinus study and referral to ENT.  Advised to continue to use Zyrtec and Flonase.  ESSENTIAL HYPERTENSION, BENIGN Patient has  not had BMP in the last year, therefore completed today.  Appears to be within normal limits, creatinine appears at baseline.  Hypertension well controlled today at 106/74.  One-time refill sent for patient's hydrochlorothiazide to encourage him to come back to see PCP for regular health maintenance.  He has had prior EKGs in the past that have not shown evidence of left ventricular hypertrophy which is reassuring given his young age and hypertension diagnosis.  Asthma, mild intermittent Does not seem as though he is well controlled.  He has not been taking Singulair however.  Refilled Breo, albuterol, Singulair.  Advised patient to take all of these.  Advised to use albuterol only as needed.  Advised follow-up with PCP at his convenience to discuss better long-term treatment of his asthma.     PATIENT EDUCATION PROVIDED: See AVS    Diagnosis and plan along with any newly prescribed medication(s) were discussed in detail with this patient today. The patient verbalized understanding and agreed with the plan. Patient advised if symptoms worsen return to clinic or ER.     Orders Placed This Encounter  Procedures  . Basic Metabolic Panel    Meds ordered this encounter  Medications  . montelukast (SINGULAIR) 10 MG tablet    Sig: Take 1 tablet (10 mg total) by mouth at bedtime.    Dispense:  30 tablet    Refill:  1  . hydrochlorothiazide (HYDRODIURIL) 12.5 MG tablet    Sig: Take 1 tablet (12.5 mg total) by mouth daily.    Dispense:  30 tablet    Refill:  0  . albuterol (VENTOLIN HFA) 108 (90 Base) MCG/ACT inhaler    Sig: TAKE 2 PUFFS BY MOUTH EVERY 6 HOURS AS NEEDED FOR WHEEZE OR SHORTNESS OF BREATH    Dispense:  18 g    Refill:  0  . fluticasone furoate-vilanterol (BREO ELLIPTA) 100-25 MCG/INH AEPB    Sig: Inhale 1 puff into the lungs daily.    Dispense:  28 each    Refill:  0  . amoxicillin-clavulanate (AUGMENTIN) 875-125 MG tablet    Sig: Take 1 tablet by mouth 2 (two) times daily  for 7 days.    Dispense:  14 tablet    Refill:  [place], DO 12/21/2019, 4:31 PM PGY-2 [place]

## 2019-12-21 DIAGNOSIS — J329 Chronic sinusitis, unspecified: Secondary | ICD-10-CM | POA: Insufficient documentation

## 2019-12-21 DIAGNOSIS — L0292 Furuncle, unspecified: Secondary | ICD-10-CM | POA: Insufficient documentation

## 2019-12-21 DIAGNOSIS — J310 Chronic rhinitis: Secondary | ICD-10-CM | POA: Insufficient documentation

## 2019-12-21 LAB — BASIC METABOLIC PANEL
BUN/Creatinine Ratio: 11 (ref 9–20)
BUN: 15 mg/dL (ref 6–20)
CO2: 24 mmol/L (ref 20–29)
Calcium: 9.7 mg/dL (ref 8.7–10.2)
Chloride: 101 mmol/L (ref 96–106)
Creatinine, Ser: 1.35 mg/dL — ABNORMAL HIGH (ref 0.76–1.27)
GFR calc Af Amer: 79 mL/min/{1.73_m2} (ref >59)
GFR calc non Af Amer: 68 mL/min/{1.73_m2} (ref >59)
Glucose: 97 mg/dL (ref 65–99)
Potassium: 5.1 mmol/L (ref 3.5–5.2)
Sodium: 138 mmol/L (ref 134–144)

## 2019-12-21 NOTE — Assessment & Plan Note (Signed)
Furuncle noted on scalp.  Did appear to have scab over it that was already draining some.  Did not seem obviously fluctuant.  No surrounding erythema.  Advised patient to use warm compresses and use Neosporin over-the-counter.  If no improvement or worsening in the next few weeks, to return to care.

## 2019-12-21 NOTE — Assessment & Plan Note (Signed)
Patient reports that he has been tested for Covid during the time of the symptoms and has been negative x2.  Cannot find this in the chart, but patient seems reliable.  Symptoms also have been going on for a few months per his report, therefore doubt COVID-19.  It sounds as though he does have allergies, and has been taking his allergy medication.  He has also been using Flonase for the last few months.  Given this and no improvement, discussed with Dr. Crissie Reese,, agreed to treat patient for bacterial rhinosinusitis.  Given scription for Augmentin x 7 days.  If no improvement, patient to return to care.  Could consider at that time getting a CT maxillofacial sinus study and referral to ENT.  Advised to continue to use Zyrtec and Flonase.

## 2019-12-21 NOTE — Assessment & Plan Note (Addendum)
Does not seem as though he is well controlled.  He has not been taking Singulair however.  Refilled Breo, albuterol, Singulair.  Advised patient to take all of these.  Advised to use albuterol only as needed.  Advised follow-up with PCP at his convenience to discuss better long-term treatment of his asthma.

## 2019-12-21 NOTE — Assessment & Plan Note (Signed)
Patient has not had BMP in the last year, therefore completed today.  Appears to be within normal limits, creatinine appears at baseline.  Hypertension well controlled today at 106/74.  One-time refill sent for patient's hydrochlorothiazide to encourage him to come back to see PCP for regular health maintenance.  He has had prior EKGs in the past that have not shown evidence of left ventricular hypertrophy which is reassuring given his young age and hypertension diagnosis.

## 2020-01-03 ENCOUNTER — Other Ambulatory Visit: Payer: Self-pay | Admitting: *Deleted

## 2020-01-03 MED ORDER — FLUTICASONE PROPIONATE 50 MCG/ACT NA SUSP: NASAL | 1 refills | Status: DC

## 2020-01-14 ENCOUNTER — Other Ambulatory Visit: Payer: Self-pay | Admitting: Family Medicine

## 2020-01-14 DIAGNOSIS — J4520 Mild intermittent asthma, uncomplicated: Secondary | ICD-10-CM

## 2020-01-16 ENCOUNTER — Encounter: Payer: Self-pay | Admitting: Family Medicine

## 2020-01-17 ENCOUNTER — Other Ambulatory Visit: Payer: Self-pay | Admitting: Family Medicine

## 2020-01-21 ENCOUNTER — Other Ambulatory Visit: Payer: Self-pay

## 2020-01-21 DIAGNOSIS — I10 Essential (primary) hypertension: Secondary | ICD-10-CM

## 2020-01-21 DIAGNOSIS — J4520 Mild intermittent asthma, uncomplicated: Secondary | ICD-10-CM

## 2020-01-21 MED ORDER — FLUTICASONE PROPIONATE 50 MCG/ACT NA SUSP: 1.0000 | Freq: Every day | NASAL | 1 refills | Status: DC

## 2020-01-21 MED ORDER — HYDROCHLOROTHIAZIDE 12.5 MG PO TABS: 12.5000 mg | ORAL_TABLET | Freq: Every day | ORAL | 0 refills | Status: DC

## 2020-01-21 MED ORDER — ALBUTEROL SULFATE HFA 108 (90 BASE) MCG/ACT IN AERS: 1.0000 | INHALATION_SPRAY | Freq: Four times a day (QID) | RESPIRATORY_TRACT | 1 refills | Status: DC | PRN

## 2020-02-01 ENCOUNTER — Other Ambulatory Visit: Payer: Self-pay | Admitting: Family Medicine

## 2020-03-05 ENCOUNTER — Ambulatory Visit: Payer: 59 | Admitting: Family Medicine

## 2020-03-05 ENCOUNTER — Encounter: Payer: Self-pay | Admitting: Family Medicine

## 2020-03-05 ENCOUNTER — Other Ambulatory Visit: Payer: Self-pay

## 2020-03-05 VITALS — BP 132/78 | HR 95 | Wt 272.0 lb

## 2020-03-05 DIAGNOSIS — J4520 Mild intermittent asthma, uncomplicated: Secondary | ICD-10-CM

## 2020-03-05 DIAGNOSIS — J3089 Other allergic rhinitis: Secondary | ICD-10-CM | POA: Diagnosis not present

## 2020-03-05 DIAGNOSIS — F419 Anxiety disorder, unspecified: Secondary | ICD-10-CM

## 2020-03-05 DIAGNOSIS — J310 Chronic rhinitis: Secondary | ICD-10-CM

## 2020-03-05 DIAGNOSIS — J329 Chronic sinusitis, unspecified: Secondary | ICD-10-CM

## 2020-03-05 DIAGNOSIS — I10 Essential (primary) hypertension: Secondary | ICD-10-CM | POA: Diagnosis not present

## 2020-03-05 MED ORDER — BREO ELLIPTA 100-25 MCG/INH IN AEPB: 1.0000 | INHALATION_SPRAY | Freq: Every day | RESPIRATORY_TRACT | 5 refills | Status: DC

## 2020-03-05 MED ORDER — CETIRIZINE HCL 10 MG PO TABS: 10.0000 mg | ORAL_TABLET | Freq: Every day | ORAL | 5 refills | Status: DC

## 2020-03-05 MED ORDER — MONTELUKAST SODIUM 10 MG PO TABS: 10.0000 mg | ORAL_TABLET | Freq: Every day | ORAL | 5 refills | Status: DC

## 2020-03-05 MED ORDER — HYDROXYZINE HCL 10 MG PO TABS: 10.0000 mg | ORAL_TABLET | Freq: Three times a day (TID) | ORAL | 2 refills | Status: DC | PRN

## 2020-03-05 MED ORDER — FLUTICASONE PROPIONATE 50 MCG/ACT NA SUSP: NASAL | 1 refills | Status: DC

## 2020-03-05 MED ORDER — HYDROCHLOROTHIAZIDE 12.5 MG PO TABS: 12.5000 mg | ORAL_TABLET | Freq: Every day | ORAL | 1 refills | Status: DC

## 2020-03-05 MED ORDER — ALBUTEROL SULFATE HFA 108 (90 BASE) MCG/ACT IN AERS: 1.0000 | INHALATION_SPRAY | Freq: Four times a day (QID) | RESPIRATORY_TRACT | 1 refills | Status: DC | PRN

## 2020-03-05 NOTE — Patient Instructions (Addendum)
Thank you for coming to see me today. It was a pleasure! Today we talked about:   I am glad you are recovering from your recent hospitalization with Covid.  I recommend that you start walking regularly as you are able. You can slowly increase the amount that you walk and the pace over time.   I have refilled the Atarax that you can use as needed for your panic attack.  I have also attached some resources that you may use for therapy.  If you decide to start a more long-term medication to prevent the panic attacks and do not hesitate to give me a call or return to the office.    I have refilled your allergy medications and also put in a referral for an allergist.  If you do not hear from them within 2 weeks and please give our office a call to ensure that the referral has been placed.  Please follow-up with me in 6-8 weeks or sooner as needed.  If you have any questions or concerns, please do not hesitate to call the office at [phone].  Take Care,   [place] Ollivander See, DO  Therapy and Counseling Resources Most providers on this list will take Medicaid. Patients with commercial insurance or Medicare should contact their insurance company to get a list of in network providers.  [facility] [address]  [place], [place] [address]       [phone]     [facility] [address] [place], [place]  [phone]    [facility] [address] [place], [place] [phone]  [facility]:  [address] [place] [phone]  [facility]:  [address] [place], [place] [phone]  [facility] (under & uninsured) [address]   [place] [phone]    [email or web address]    [facility] [place] -[address]     Phone:  [phone]     [facility]-  [address]  [phone]    [facility] [address]      [place], [place] [phone]  [facility]: [address] [place], [place], [place]  [phone]   [facility] (Spanish therapist) [address] [place]  [address]   [place] [address]    [phone]    [facility]   [address],  [place], [place]  [phone]   [facility]  [address] [place]  [phone]  [facility]  [address] [place], [place]        [phone]  [facility] under & uninsured [place],  [address] [place] [phone]):  Mon - Fri from 8 AM - 3 PM  [facility] [address],  (Spanish)   [address], [place]: [phone]) 8:30 - 12; 1 - 2:30  [facility] [employer],  [address] [place], [place]    [phone]):8:30 - 12; 2 - 3PM  [facility] [employer],  [address]  [place]; [phone]):   Mon - Fri 8 AM - 5 PM  [facility] [address] [place]  MWF 12:30 to 3:00 or call to schedule an appointment  [phone]  Specific Provider options Psychology Today  [email or web address] 1. click on find a therapist  2. enter your zip code 3. left side and select or tailor a therapist for your specific need.   [facility] Provider Directory [email or web address]  (Medicaid)   Follow all drop down to find a provider  [place] or [email or web address] [address], [place], [place] Recovery support and educational   In home counseling [place] Telephone: [phone]  office in [place] [email or web address]   Does not take reg. Medicaid or Medicare private insurance [facility], [place] health Choice, [facility], [place], [place], [place], [place] Health Choice  24- Hour Availability:  . [place] or [phone]  . [facility] [employer] [phone]  [facility]  [phone]   . [place]  [phone] (after hours)  . [facility]Mobile Crisis   [phone]  . [place] National Suicide Hotline  (301)171-3415 ([place])  . Call 911 or go to emergency room  . [employer]  [phone]);  [place] and [employer]   . [facility] ACCESS  [phone]); [place], [place], [place], [place], [place], [place], [place]

## 2020-03-05 NOTE — Progress Notes (Signed)
SUBJECTIVE:   CHIEF COMPLAINT / HPI:   Hospital f/u for COVID  Asthma: Patient reports that he was hospitalized for Covid for about 1 week in [place].  States he was given steroids as well as multiple vitamins to take.  He states since his discharge from the hospital he is feeling much better not having any trouble breathing.  He does report that he is having less exercise tolerance than previously.  He denies any chest pain, palpitations, leg swelling, shortness of breath or cough.  Allergies: Patient reports that he has difficulty with his allergies.  He has a history of asthma that is well controlled on Breo and albuterol as needed.  Patient also takes Singulair in addition to Flonase and Zyrtec which helped control his symptoms however he states during the spring he often has a hard time controlling this.  He has previously been in evaluated by an allergist and would like a referral in order to follow-up with an allergist again.   Anxiety: Patient reports that he has been having some issues with waking up in the middle of the night with panic attacks.  States that they started while he was in the hospital with Covid.  He reports that in the hospital day gave him hydroxyzine which he would take as needed and it helped with his anxiety.  He reports that some of this may be due to him gaining weight recently.  Patient previously has worked hard on weight loss and after being stuck in his home and unable to go to the gym and then being sick with Covid and started on steroids he has noticed a weight increase.  He is anxious to get back into exercise which also helps relieve some of his anxiety.  Patient reports that he would like to continue taking hydroxyzine and speak with a therapist rather than starting a daily medication such as an SSRI at this time.  He may think about that at a later time.  He has previously tried Zoloft, Remeron and Paxil.  He does not remember if the Remeron or  Paxil helped but states that the Zoloft had side effects that he did not like.  Patient has not had any thoughts of hurting himself or others.  Hypertension: - Medications: HCTZ 12.5mg  - Compliance: yes - Checking BP at home: no - Denies any SOB, CP, vision changes, LE edema, medication SEs, or symptoms of hypotension - Exercise: patient tries to exercise regularly  PERTINENT  PMH / PSH: Asthma, HTN, anxiety/depression  OBJECTIVE:  BP 132/78   Pulse 95   Wt 272 lb (123.4 kg)   SpO2 100%   BMI 34.92 kg/m   General: NAD, pleasant Neck: Supple, no LAD Cardiovascular: RRR, no m/r/g Respiratory: CTA BL, normal work of breathing Neuro: CN II-XII grossly intact Psych: AOx3, appropriate affect  ASSESSMENT/PLAN:   ESSENTIAL HYPERTENSION, BENIGN BP today 132/78 which is within goal, will continue hydrochlorothiazide 12.5 mg.  Patient encouraged to begin exercising as he is able  Asthma, mild intermittent Refilled albuterol, Breo and Singulair.  Patient recovering after recent hospitalization with COVID-19 and has finished his steroid treatment.  Will refer to allergy per his request.  Lungs clear today and patient not requiring any albuterol after being discharged from the hospital.  Allergic rhinitis Allergies worse in the spring.  Refilled patient's Zyrtec, Flonase and Singulair.  Referral made to allergist per request.  Anxiety Patient with history of anxiety.  Previously has not tolerated side effects from  Zoloft.  Reports having occasional panic attacks where he is waking up at night.  Was recently given Atarax which he states helps him significantly with his anxiety.  He also believes that being able to exercise more regularly again will help with his anxiety as it has in the past.  Patient has previously been prescribed Remeron and Paxil which he does not remember if it helped however he would not like to initiate an SSRI at this time.  He will seek therapy.  Given resources.  If he  does not have any improvement in his anxiety then he will return in 6 to 8 weeks to discuss starting a medication such as SSRI.  Refilled Atarax for him to use as needed for his panic attacks.  Strict return precautions discussed, patient voiced understanding.    Scott Reyes [place], DO PGY-3, [facility]

## 2020-03-07 ENCOUNTER — Encounter: Payer: Self-pay | Admitting: Family Medicine

## 2020-03-07 NOTE — Assessment & Plan Note (Signed)
BP today 132/78 which is within goal, will continue hydrochlorothiazide 12.5 mg.  Patient encouraged to begin exercising as he is able

## 2020-03-07 NOTE — Assessment & Plan Note (Signed)
Patient with history of anxiety.  Previously has not tolerated side effects from Zoloft.  Reports having occasional panic attacks where he is waking up at night.  Was recently given Atarax which he states helps him significantly with his anxiety.  He also believes that being able to exercise more regularly again will help with his anxiety as it has in the past.  Patient has previously been prescribed Remeron and Paxil which he does not remember if it helped however he would not like to initiate an SSRI at this time.  He will seek therapy.  Given resources.  If he does not have any improvement in his anxiety then he will return in 6 to 8 weeks to discuss starting a medication such as SSRI.  Refilled Atarax for him to use as needed for his panic attacks.  Strict return precautions discussed, patient voiced understanding.

## 2020-03-07 NOTE — Assessment & Plan Note (Signed)
Refilled albuterol, Breo and Singulair.  Patient recovering after recent hospitalization with COVID-19 and has finished his steroid treatment.  Will refer to allergy per his request.  Lungs clear today and patient not requiring any albuterol after being discharged from the hospital.

## 2020-03-07 NOTE — Assessment & Plan Note (Addendum)
Allergies worse in the spring.  Refilled patient's Zyrtec, Flonase and Singulair.  Referral made to allergist per request.

## 2020-03-28 ENCOUNTER — Other Ambulatory Visit: Payer: Self-pay

## 2020-03-28 ENCOUNTER — Encounter (HOSPITAL_BASED_OUTPATIENT_CLINIC_OR_DEPARTMENT_OTHER): Payer: Self-pay | Admitting: *Deleted

## 2020-03-28 ENCOUNTER — Emergency Department (HOSPITAL_BASED_OUTPATIENT_CLINIC_OR_DEPARTMENT_OTHER)
Admission: EM | Admit: 2020-03-28 | Discharge: 2020-03-28 | Disposition: A | Discharge status: Home / Self Care | Payer: 59 | Attending: Emergency Medicine | Admitting: Emergency Medicine

## 2020-03-28 DIAGNOSIS — J45909 Unspecified asthma, uncomplicated: Secondary | ICD-10-CM | POA: Insufficient documentation

## 2020-03-28 DIAGNOSIS — Z79899 Other long term (current) drug therapy: Secondary | ICD-10-CM | POA: Diagnosis not present

## 2020-03-28 DIAGNOSIS — J029 Acute pharyngitis, unspecified: Secondary | ICD-10-CM | POA: Diagnosis not present

## 2020-03-28 DIAGNOSIS — I10 Essential (primary) hypertension: Secondary | ICD-10-CM | POA: Diagnosis not present

## 2020-03-28 LAB — GROUP A STREP BY PCR: Group A Strep by PCR: NOT DETECTED

## 2020-03-28 MED ORDER — PENICILLIN V POTASSIUM 500 MG PO TABS: 500.0000 mg | ORAL_TABLET | Freq: Four times a day (QID) | ORAL | 0 refills | Status: DC

## 2020-03-28 NOTE — ED Provider Notes (Signed)
[facility] EMERGENCY DEPARTMENT Provider Note   CSN: [id number] Arrival date & time: 03/28/20  1720     History Chief Complaint  Patient presents with  . Sore Throat    Scott Patterson is a 34 y.o. male.  The history is provided by the patient. No language interpreter was used.  Sore Throat This is a new problem. The current episode started 2 days ago. The problem occurs constantly. The problem has not changed since onset.Nothing aggravates the symptoms. Nothing relieves the symptoms. He has tried nothing for the symptoms. The treatment provided no relief.   Pt reports his 2 children have strep.  Pt complains of a sore throat.     Past Medical History:  Diagnosis Date  . Anxiety   . Asthma   . Depression   . Genital herpes 04/02/2011  . Hypertension     Patient Active Problem List   Diagnosis Date Noted  . Anxiety 02/06/2018  . Depression 01/11/2014  . ESSENTIAL HYPERTENSION, BENIGN 12/11/2010  . OBESITY, NOS 02/09/2007  . Allergic rhinitis 02/09/2007  . Asthma, mild intermittent 02/09/2007    Past Surgical History:  Procedure Laterality Date  . None         Family History  Problem Relation Age of Onset  . Depression Mother   . Hypertension Mother   . Alcohol abuse Father   . Drug abuse Father   . Stroke Father   . Hypertension Father   . Alcohol abuse Paternal Uncle   . Hypertension Brother     Social History   Tobacco Use  . Smoking status: Never Smoker  . Smokeless tobacco: Never Used  Substance Use Topics  . Alcohol use: No  . Drug use: No    Home Medications Prior to Admission medications   Medication Sig Start Date End Date Taking? Authorizing Provider  albuterol (VENTOLIN HFA) 108 (90 Base) MCG/ACT inhaler Inhale 1-2 puffs into the lungs every 6 (six) hours as needed for wheezing or shortness of breath. 03/05/20  Yes Enid Derry, [place], DO  cetirizine (ZYRTEC) 10 MG tablet Take 1 tablet (10 mg total) by mouth daily. 03/05/20  Yes  Enid Derry, [place], DO  fluticasone [facility]) 50 MCG/ACT nasal spray USE 1 SPRAY INTO BOTH NOSTRILS EVERY MORNING. 03/05/20  Yes Enid Derry, [place], DO  fluticasone furoate-vilanterol (BREO ELLIPTA) 100-25 MCG/INH AEPB Inhale 1 puff into the lungs daily. 03/05/20  Yes Enid Derry, [place], DO  hydrochlorothiazide (HYDRODIURIL) 12.5 MG tablet Take 1 tablet (12.5 mg total) by mouth daily. 03/05/20  Yes Enid Derry, [place], DO  hydrOXYzine (ATARAX/VISTARIL) 10 MG tablet Take 1 tablet (10 mg total) by mouth 3 (three) times daily as needed. 03/05/20  Yes Enid Derry, [place], DO  montelukast (SINGULAIR) 10 MG tablet Take 1 tablet (10 mg total) by mouth at bedtime. 03/05/20  Yes Shirley, [place], DO  Spacer/Aero-Hold Chamber Bags MISC Use with inhaler 03/22/19  Yes Martyn Malay, MD  fluticasone [facility]) 50 MCG/ACT nasal spray Place 1 spray into both nostrils every morning. 06/26/19   Shirley, [place], DO  fluticasone [facility]) 50 MCG/ACT nasal spray PLACE 1 SPRAY INTO BOTH NOSTRILS EVERY MORNING. 08/02/19   Shirley, [place], DO  fluticasone [facility]) 50 MCG/ACT nasal spray Place 1 spray into both nostrils daily. 01/21/20   Shirley, [place], DO  montelukast (SINGULAIR) 10 MG tablet Take 1 tablet (10 mg total) by mouth at bedtime. 07/11/19   Shirley, [place], DO    Allergies    Patient has no known allergies.  Review of Systems  Review of Systems  All other systems reviewed and are negative.   Physical Exam Updated Vital Signs BP (!) 137/95 (BP Location: Left Arm)   Pulse 99   Temp 98.1 F (36.7 C) (Oral)   Resp 16   Ht 6\' 2"  (1.88 m)   SpO2 100%   BMI 34.92 kg/m   Physical Exam Vitals and nursing note reviewed.  Constitutional:      Appearance: He is well-developed.  HENT:     Head: Normocephalic and atraumatic.     Mouth/Throat:     Pharynx: Posterior oropharyngeal erythema present.  Eyes:     Conjunctiva/sclera: Conjunctivae normal.  Cardiovascular:     Rate and Rhythm: Normal rate.     Heart sounds: No  murmur.  Pulmonary:     Effort: Pulmonary effort is normal. No respiratory distress.     Breath sounds: Normal breath sounds.  Abdominal:     Palpations: Abdomen is soft.     Tenderness: There is no abdominal tenderness.  Musculoskeletal:     Cervical back: Neck supple.  Skin:    General: Skin is warm and dry.  Neurological:     Mental Status: He is alert.  Psychiatric:        Mood and Affect: Mood normal.     ED Results / Procedures / Treatments   Labs (all labs ordered are listed, but only abnormal results are displayed) Labs Reviewed  GROUP A STREP BY PCR    EKG None  Radiology No results found.  Procedures Procedures (including critical care time)  Medications Ordered in ED Medications - No data to display  ED Course  I have reviewed the triage vital signs and the nursing notes.  Pertinent labs & imaging results that were available during my care of the patient were reviewed by me and considered in my medical decision making (see chart for details).    MDM Rules/Calculators/A&P                       Final Clinical Impression(s) / ED Diagnoses Final diagnoses:  Acute pharyngitis, unspecified etiology    Rx / DC Orders ED Discharge Orders         Ordered    penicillin v potassium (VEETID) 500 MG tablet  4 times daily     03/28/20 1827        An After Visit Summary was printed and given to the patient.   03/30/20 03/28/20 1828    03/30/20, MD 03/28/20 2256

## 2020-03-28 NOTE — ED Triage Notes (Signed)
Sore throat x 2 days. His kids have strep.

## 2020-04-09 ENCOUNTER — Ambulatory Visit: Payer: 59 | Admitting: Allergy

## 2020-04-09 NOTE — Progress Notes (Deleted)
New Patient Note  RE: Scott Patterson MRN: [id number] DOB: [birth year] Date of Office Visit: 04/09/2020  Referring provider: Westley Chandler, MD Primary care provider: Shirley, [place], DO  Chief Complaint: No chief complaint on file.  History of Present Illness: I had the pleasure of seeing Scott Patterson for initial evaluation at the [facility] on 04/09/2020. He is a 34 y.o. male, who is referred here by Shirley, [place], DO for the evaluation of asthma and allergic rhinitis.  Asthma: He reports symptoms of *** chest tightness, shortness of breath, coughing, wheezing, nocturnal awakenings for *** years. Current medications include *** which help. He reports *** using aerochamber with inhalers. He tried the following inhalers: ***. Main triggers are ***allergies, infections, weather changes, smoke, exercise, pet exposure. In the last month, frequency of symptoms: ***x/week. Frequency of nocturnal symptoms: ***x/month. Frequency of SABA use: ***x/week. Interference with physical activity: ***. Sleep is ***disturbed. In the last 12 months, emergency room visits/urgent care visits/doctor office visits or hospitalizations due to respiratory issues: ***. In the last 12 months, oral steroids courses: ***. Lifetime history of hospitalization for respiratory issues: ***. Prior intubations: ***. Asthma was diagnosed at age *** by ***. History of pneumonia: ***. He was evaluated by allergist ***pulmonologist in the past. Smoking exposure: ***. Up to date with flu vaccine: ***. Up to date with pneumonia vaccine: ***.  History of reflux: ***.  Rhinitis: He reports symptoms of ***. Symptoms have been going on for *** years. The symptoms are present *** all year around with worsening in ***. Other triggers include exposure to ***. Anosmia: ***. Headache: ***. He has used *** with ***fair improvement in symptoms. Sinus infections: ***. Previous work up includes: ***. Previous ENT evaluation:  ***. Previous sinus imaging: ***. History of nasal polyps: ***. Last eye exam: ***. History of reflux: ***.  Assessment and Plan: [name] is a 34 y.o. male with: No problem-specific Assessment & Plan notes found for this encounter.  No follow-ups on file.  No orders of the defined types were placed in this encounter.  Lab Orders  No laboratory test(s) ordered today    Other allergy screening: Asthma: {Blank single:19197::"yes","no"} Rhino conjunctivitis: {Blank single:19197::"yes","no"} Food allergy: {Blank single:19197::"yes","no"} Medication allergy: {Blank single:19197::"yes","no"} Hymenoptera allergy: {Blank single:19197::"yes","no"} Urticaria: {Blank single:19197::"yes","no"} Eczema:{Blank single:19197::"yes","no"} History of recurrent infections suggestive of immunodeficency: {Blank single:19197::"yes","no"}  Diagnostics: Spirometry:  Tracings reviewed. His effort: {Blank single:19197::"Good reproducible efforts.","It was hard to get consistent efforts and there is a question as to whether this reflects a maximal maneuver.","Poor effort, data can not be interpreted."} FVC: ***L FEV1: ***L, ***% predicted FEV1/FVC ratio: ***% Interpretation: {Blank single:19197::"Spirometry consistent with mild obstructive disease","Spirometry consistent with moderate obstructive disease","Spirometry consistent with severe obstructive disease","Spirometry consistent with possible restrictive disease","Spirometry consistent with mixed obstructive and restrictive disease","Spirometry uninterpretable due to technique","Spirometry consistent with normal pattern","No overt abnormalities noted given today's efforts"}.  Please see scanned spirometry results for details.  Skin Testing: {Blank single:19197::"Select foods","Environmental allergy panel","Environmental allergy panel and select foods","Food allergy panel","None","Deferred due to recent antihistamines use"}. Positive test to: ***. Negative  test to: ***.  Results discussed with patient/family.   Past Medical History: Patient Active Problem List   Diagnosis Date Noted  . Anxiety 02/06/2018  . Depression 01/11/2014  . ESSENTIAL HYPERTENSION, BENIGN 12/11/2010  . OBESITY, NOS 02/09/2007  . Allergic rhinitis 02/09/2007  . Asthma, mild intermittent 02/09/2007   Past Medical History:  Diagnosis Date  . Anxiety   . Asthma   .  Depression   . Genital herpes 04/02/2011  . Hypertension    Past Surgical History: Past Surgical History:  Procedure Laterality Date  . None     Medication List:  Current Outpatient Medications  Medication Sig Dispense Refill  . albuterol (VENTOLIN HFA) 108 (90 Base) MCG/ACT inhaler Inhale 1-2 puffs into the lungs every 6 (six) hours as needed for wheezing or shortness of breath. 18 g 1  . cetirizine (ZYRTEC) 10 MG tablet Take 1 tablet (10 mg total) by mouth daily. 30 tablet 5  . fluticasone (FLONASE) 50 MCG/ACT nasal spray USE 1 SPRAY INTO BOTH NOSTRILS EVERY MORNING. 16 mL 1  . fluticasone furoate-vilanterol (BREO ELLIPTA) 100-25 MCG/INH AEPB Inhale 1 puff into the lungs daily. 28 each 5  . hydrochlorothiazide (HYDRODIURIL) 12.5 MG tablet Take 1 tablet (12.5 mg total) by mouth daily. 90 tablet 1  . hydrOXYzine (ATARAX/VISTARIL) 10 MG tablet Take 1 tablet (10 mg total) by mouth 3 (three) times daily as needed. 30 tablet 2  . montelukast (SINGULAIR) 10 MG tablet Take 1 tablet (10 mg total) by mouth at bedtime. 30 tablet 5  . penicillin v potassium (VEETID) 500 MG tablet Take 1 tablet (500 mg total) by mouth 4 (four) times daily. 40 tablet 0  . Spacer/Aero-Hold Chamber Bags MISC Use with inhaler 1 each 1   No current facility-administered medications for this visit.   Allergies: No Known Allergies Social History: Social History   Socioeconomic History  . Marital status: Single    Spouse name: Not on file  . Number of children: Not on file  . Years of education: Not on file  . Highest  education level: Not on file  Occupational History  . Occupation: BOF    Comment: Mortgages  Tobacco Use  . Smoking status: Never Smoker  . Smokeless tobacco: Never Used  Substance and Sexual Activity  . Alcohol use: No  . Drug use: No  . Sexual activity: Not on file  Other Topics Concern  . Not on file  Social History Narrative   Lives alone.     Social Determinants of Health   Financial Resource Strain:   . Difficulty of Paying Living Expenses:   Food Insecurity:   . Worried About Charity fundraiser in the Last Year:   . Arboriculturist in the Last Year:   Transportation Needs:   . Film/video editor (Medical):   Marland Kitchen Lack of Transportation (Non-Medical):   Physical Activity:   . Days of Exercise per Week:   . Minutes of Exercise per Session:   Stress:   . Feeling of Stress :   Social Connections:   . Frequency of Communication with Friends and Family:   . Frequency of Social Gatherings with Friends and Family:   . Attends Religious Services:   . Active Member of Clubs or Organizations:   . Attends Archivist Meetings:   Marland Kitchen Marital Status:    Lives in a ***. Smoking: *** Occupation: ***  Environmental HistoryFreight forwarder in the house: Estate agent in the family room: {Blank single:19197::"yes","no"} Carpet in the bedroom: {Blank single:19197::"yes","no"} Heating: {Blank single:19197::"electric","gas"} Cooling: {Blank single:19197::"central","window"} Pet: {Blank single:19197::"yes ***","no"}  Family History: Family History  Problem Relation Age of Onset  . Depression Mother   . Hypertension Mother   . Alcohol abuse Father   . Drug abuse Father   . Stroke Father   . Hypertension Father   . Alcohol abuse Paternal Uncle   .  Hypertension Brother    Problem                               Relation Asthma                                   *** Eczema                                *** Food allergy                           *** Allergic rhino conjunctivitis     ***  Review of Systems  Constitutional: Negative for appetite change, chills, fever and unexpected weight change.  HENT: Negative for congestion and rhinorrhea.   Eyes: Negative for itching.  Respiratory: Negative for cough, chest tightness, shortness of breath and wheezing.   Cardiovascular: Negative for chest pain.  Gastrointestinal: Negative for abdominal pain.  Genitourinary: Negative for difficulty urinating.  Skin: Negative for rash.  Allergic/Immunologic: Negative for environmental allergies and food allergies.  Neurological: Negative for headaches.   Objective: There were no vitals taken for this visit. There is no height or weight on file to calculate BMI. Physical Exam  Constitutional: He is oriented to person, place, and time. He appears well-developed and well-nourished.  HENT:  Head: Normocephalic and atraumatic.  Right Ear: External ear normal.  Left Ear: External ear normal.  Nose: Nose normal.  Mouth/Throat: Oropharynx is clear and moist.  Eyes: Conjunctivae and EOM are normal.  Cardiovascular: Normal rate, regular rhythm and normal heart sounds. Exam reveals no gallop and no friction rub.  No murmur heard. Pulmonary/Chest: Effort normal and breath sounds normal. He has no wheezes. He has no rales.  Abdominal: Soft.  Musculoskeletal:     Cervical back: Neck supple.  Neurological: He is alert and oriented to person, place, and time.  Skin: Skin is warm. No rash noted.  Psychiatric: He has a normal mood and affect. His behavior is normal.  Nursing note and vitals reviewed.  The plan was reviewed with the patient/family, and all questions/concerned were addressed.  It was my pleasure to see Kelby today and participate in his care. Please feel free to contact me with any questions or concerns.  Sincerely,  Wyline Mood, DO Allergy & Immunology  [facility] office:  [phone] [facility] office: [phone] [place] office: [phone]

## 2020-04-22 ENCOUNTER — Ambulatory Visit: Payer: 59 | Admitting: Allergy & Immunology

## 2020-04-23 ENCOUNTER — Other Ambulatory Visit: Payer: Self-pay

## 2020-04-23 ENCOUNTER — Ambulatory Visit (INDEPENDENT_AMBULATORY_CARE_PROVIDER_SITE_OTHER): Payer: 59 | Admitting: Allergy

## 2020-04-23 ENCOUNTER — Encounter: Payer: Self-pay | Admitting: Allergy

## 2020-04-23 VITALS — BP 124/82 | HR 81 | Temp 97.2°F | Resp 16 | Ht 74.0 in | Wt 280.2 lb

## 2020-04-23 DIAGNOSIS — H1013 Acute atopic conjunctivitis, bilateral: Secondary | ICD-10-CM

## 2020-04-23 DIAGNOSIS — J4540 Moderate persistent asthma, uncomplicated: Secondary | ICD-10-CM | POA: Diagnosis not present

## 2020-04-23 DIAGNOSIS — J3089 Other allergic rhinitis: Secondary | ICD-10-CM

## 2020-04-23 MED ORDER — ALBUTEROL SULFATE HFA 108 (90 BASE) MCG/ACT IN AERS: 1.0000 | INHALATION_SPRAY | Freq: Four times a day (QID) | RESPIRATORY_TRACT | 1 refills | Status: DC | PRN

## 2020-04-23 MED ORDER — MONTELUKAST SODIUM 10 MG PO TABS: 10.0000 mg | ORAL_TABLET | Freq: Every day | ORAL | 5 refills | Status: DC

## 2020-04-23 MED ORDER — OLOPATADINE HCL 0.2 % OP SOLN: 1.0000 [drp] | Freq: Every day | OPHTHALMIC | 5 refills | Status: DC

## 2020-04-23 MED ORDER — AZELASTINE-FLUTICASONE 137-50 MCG/ACT NA SUSP: 1.0000 | Freq: Two times a day (BID) | NASAL | 5 refills | Status: DC

## 2020-04-23 MED ORDER — LEVOCETIRIZINE DIHYDROCHLORIDE 5 MG PO TABS
5.0000 mg | ORAL_TABLET | Freq: Every evening | ORAL | 5 refills | Status: DC
Start: 2020-04-23 — End: 2021-09-08

## 2020-04-23 MED ORDER — BREO ELLIPTA 200-25 MCG/INH IN AEPB: 1.0000 | INHALATION_SPRAY | Freq: Every day | RESPIRATORY_TRACT | 5 refills | Status: DC

## 2020-04-23 NOTE — Patient Instructions (Addendum)
Asthma - at this time not well controlled - lung function testing is normal today - have access to albuterol inhaler 2 puffs every 4-6 hours as needed for cough/wheeze/shortness of breath/chest tightness.  May use 15-20 minutes prior to activity.   Monitor frequency of use.   - stop Breo 114mg  - start Breo 2065m 1 puff daily - resume Singulair '10mg'$  daily use at bedtime - will obtain labwork today to assess for eosinophils which can drive asthma symptoms  Asthma control goals:   Full participation in all desired activities (may need albuterol before activity)  Albuterol use two time or less a week on average (not counting use with activity)  Cough interfering with sleep two time or less a month  Oral steroids no more than once a year  No hospitalizations  Allergies  - will obtain environmental allergy testing via blood work  - resume Singulair as above  - stop Zyrtec and trial Xyzal '5mg'$  or Allegra '180mg'$  daily  - stop Flonase and trial Dymista 1 spray each nostril twice a day.  This is a combination nasal spray with Flonase + Astelin (nasal antihistamine).  This helps with both nasal congestion and drainage.   - discussed performing nasal saline rinses.  Provided with rinse kit today.  Use distilled water or boil water and cool to room temperature before use.  Breathe thru mouth during the rinse.    After performing rinse then use your medicated nasal spray.    - use Olopatadine 0.2% 1 drop each eye daily as needed for itchy, watery eyes  Follow-up 3 months or sooner if needed

## 2020-04-23 NOTE — Progress Notes (Signed)
New Patient Note  RE: Scott Patterson MRN: [id number] DOB: [birth year] Date of Office Visit: 04/23/2020  Referring provider: Martyn Malay, MD Primary care provider: Shirley, [place], DO  Chief Complaint: asthma  History of present illness: Scott Patterson is a 34 y.o. male presenting today for consultation for asthma.  He was diagnosed with asthma in childhood.  He feels as he has gotten older his ashtma has gotten worse.  Shortness of breath, wheezing and chest tightness are symptoms with his asthma.  He does report smoke exposure is trigger.  2-3 times a week he notes asthma symptoms both daytime and nighttime awakening symptoms.   He reports using albuterol about 5 times a week.  He reports using Breo daily 1 puff for the past 1.5 years.  He has singulair but states he does not take it everyday.  No hospitalization for asthma exacerbations.  He thinks he has needed steroids for flares in the past.    He reports having environmental allergies as well.  His symptoms include sneezing, itchy and watery eyes, eye puffiness at night, nasal congestion and drainage.  He feels the symptoms are year-round primarily.  Has been taking zyrtec daily for years.  Using flonase 2 sprays each nostril daily and helps a little bit with his congestion.  Has not tried any eyedrops or saline rinses before.    No history of eczema or food allergy.     Review of systems: Review of Systems  Constitutional: Negative.   HENT:       See HPI  Eyes:       See HPI  Respiratory:       See HPI  Cardiovascular: Negative.   Gastrointestinal: Negative.   Musculoskeletal: Negative.   Skin: Negative.   Neurological: Negative.     All other systems negative unless noted above in HPI  Past medical history: Past Medical History:  Diagnosis Date  . Anxiety   . Asthma   . Depression   . Genital herpes 04/02/2011  . Hypertension     Past surgical history: Past Surgical History:  Procedure Laterality Date  .  None      Family history:  Family History  Problem Relation Age of Onset  . Depression Mother   . Hypertension Mother   . Allergic rhinitis Mother   . Alcohol abuse Father   . Drug abuse Father   . Stroke Father   . Hypertension Father   . Allergic rhinitis Father   . Alcohol abuse Paternal Uncle   . Hypertension Brother   . Angioedema Neg Hx   . Asthma Neg Hx   . Atopy Neg Hx   . Eczema Neg Hx   . Immunodeficiency Neg Hx   . Urticaria Neg Hx     Social history: He lives in a home without carpeting with electric heating and central cooling.  No pets in the home.  No concern for water damage, mildew or roaches in the home.  He works in Arts administrator doing pretrial release.  Denies a smoking history.  Medication List: Current Outpatient Medications  Medication Sig Dispense Refill  . albuterol (VENTOLIN HFA) 108 (90 Base) MCG/ACT inhaler Inhale 1-2 puffs into the lungs every 6 (six) hours as needed for wheezing or shortness of breath. 18 g 1  . hydrochlorothiazide (HYDRODIURIL) 12.5 MG tablet Take 1 tablet (12.5 mg total) by mouth daily. 90 tablet 1  . hydrOXYzine (ATARAX/VISTARIL) 10 MG tablet Take 1 tablet (10 mg  total) by mouth 3 (three) times daily as needed. 30 tablet 2  . montelukast (SINGULAIR) 10 MG tablet Take 1 tablet (10 mg total) by mouth at bedtime. 30 tablet 5  . penicillin v potassium (VEETID) 500 MG tablet Take 1 tablet (500 mg total) by mouth 4 (four) times daily. 40 tablet 0  . Spacer/Aero-Hold Chamber Bags MISC Use with inhaler 1 each 1  . Azelastine-Fluticasone 137-50 MCG/ACT SUSP Place 1 spray into the nose in the morning and at bedtime. 23 g 5  . fluticasone furoate-vilanterol (BREO ELLIPTA) 200-25 MCG/INH AEPB Inhale 1 puff into the lungs daily. 60 each 5  . levocetirizine (XYZAL) 5 MG tablet Take 1 tablet (5 mg total) by mouth every evening. 30 tablet 5  . Olopatadine HCl 0.2 % SOLN Apply 1 drop to eye daily. 2.5 mL 5   No current facility-administered  medications for this visit.    Known medication allergies: No Known Allergies   Physical examination: Blood pressure 124/82, pulse 81, temperature (!) 97.2 F (36.2 C), temperature source Temporal, resp. rate 16, height _0  (1.88 m), weight 280 lb 3.2 oz (127.1 kg), SpO2 99 %.  General: Alert, interactive, in no acute distress. HEENT: PERRLA, TMs pearly gray, turbinates moderately edematous with clear discharge, post-pharynx non erythematous. Neck: Supple without lymphadenopathy. Lungs: Clear to auscultation without wheezing, rhonchi or rales. {no increased work of breathing. CV: Normal S1, S2 without murmurs. Abdomen: Nondistended, nontender. Skin: Warm and dry, without lesions or rashes. Extremities:  No clubbing, cyanosis or edema. Neuro:   Grossly intact.  Diagnositics/Labs:  Spirometry: FEV1: 3.42 L 81%, FVC: 4.17 L 82%, ratio consistent with Nonobstructive pattern  Allergy testing: Unable to perform due to recent antihistamine use  Assessment and plan:   Asthma, moderate persistent - at this time not well controlled - lung function testing is normal today - have access to albuterol inhaler 2 puffs every 4-6 hours as needed for cough/wheeze/shortness of breath/chest tightness.  May use 15-20 minutes prior to activity.   Monitor frequency of use.   - stop Breo 143mg  - start Breo 2024m 1 puff daily - resume Singulair 1035maily use at bedtime - will obtain labwork today to assess for eosinophils which can drive asthma symptoms  Asthma control goals:   Full participation in all desired activities (may need albuterol before activity)  Albuterol use two time or less a week on average (not counting use with activity)  Cough interfering with sleep two time or less a month  Oral steroids no more than once a year  No hospitalizations  Rhinitis with conjunctivitis, presumed allergic  - will obtain environmental allergy testing via blood work  - resume Singulair as  above  - stop Zyrtec and trial Xyzal 5mg2m Allegra 180mg42mly  - stop Flonase and trial Dymista 1 spray each nostril twice a day.  This is a combination nasal spray with Flonase + Astelin (nasal antihistamine).  This helps with both nasal congestion and drainage.   - discussed performing nasal saline rinses.  Provided with rinse kit today.  Use distilled water or boil water and cool to room temperature before use.  Breathe thru mouth during the rinse.    After performing rinse then use your medicated nasal spray.    - use Olopatadine 0.2% 1 drop each eye daily as needed for itchy, watery eyes  Follow-up 3 months or sooner if needed  I appreciate the opportunity to take part in Scott Patterson's care. Please do  not hesitate to contact me with questions.  Sincerely,   Prudy Feeler, MD Allergy/Immunology [facility]

## 2020-04-28 LAB — ALLERGENS, ZONE 2
Alternaria Alternata IgE: 3.49 kU/L — AB
Amer Sycamore IgE Qn: 1.06 kU/L — AB
Aspergillus Fumigatus IgE: 2.43 kU/L — AB
Bahia Grass IgE: 1.1 kU/L — AB
Bermuda Grass IgE: 0.79 kU/L — AB
Cat Dander IgE: 0.68 kU/L — AB
Cedar, Mountain IgE: 0.91 kU/L — AB
Cladosporium Herbarum IgE: 2.08 kU/L — AB
Cockroach, American IgE: 0.45 kU/L — AB
Common Silver Birch IgE: 9.69 kU/L — AB
D Farinae IgE: 5.73 kU/L — AB
D Pteronyssinus IgE: 6.88 kU/L — AB
Dog Dander IgE: 0.2 kU/L — AB
Elm, American IgE: 1.66 kU/L — AB
Hickory, White IgE: 4.34 kU/L — AB
Johnson Grass IgE: 0.86 kU/L — AB
Maple/Box Elder IgE: 1.02 kU/L — AB
Mucor Racemosus IgE: 0.96 kU/L — AB
Mugwort IgE Qn: 0.54 kU/L — AB
Nettle IgE: 0.88 kU/L — AB
Oak, White IgE: 10.9 kU/L — AB
Penicillium Chrysogen IgE: 1.79 kU/L — AB
Pigweed, Rough IgE: 0.73 kU/L — AB
Plantain, English IgE: 0.76 kU/L — AB
Ragweed, Short IgE: 1.02 kU/L — AB
Sheep Sorrel IgE Qn: 0.54 kU/L — AB
Stemphylium Herbarum IgE: 1.68 kU/L — AB
Sweet gum IgE RAST Ql: 2.65 kU/L — AB
Timothy Grass IgE: 1.3 kU/L — AB
White Mulberry IgE: 0.29 kU/L — AB

## 2020-04-28 LAB — CBC WITH DIFFERENTIAL
Basophils Absolute: 0 10*3/uL (ref 0.0–0.2)
Basos: 0 %
EOS (ABSOLUTE): 0 10*3/uL (ref 0.0–0.4)
Eos: 1 %
Hematocrit: 51 % (ref 37.5–51.0)
Hemoglobin: 16.3 g/dL (ref 13.0–17.7)
Immature Grans (Abs): 0 10*3/uL (ref 0.0–0.1)
Immature Granulocytes: 1 %
Lymphocytes Absolute: 1.1 10*3/uL (ref 0.7–3.1)
Lymphs: 26 %
MCH: 25.2 pg — ABNORMAL LOW (ref 26.6–33.0)
MCHC: 32 g/dL (ref 31.5–35.7)
MCV: 79 fL (ref 79–97)
Monocytes Absolute: 0.5 10*3/uL (ref 0.1–0.9)
Monocytes: 11 %
Neutrophils Absolute: 2.6 10*3/uL (ref 1.4–7.0)
Neutrophils: 61 %
RBC: 6.47 x10E6/uL — ABNORMAL HIGH (ref 4.14–5.80)
RDW: 15.5 % — ABNORMAL HIGH (ref 11.6–15.4)
WBC: 4.3 10*3/uL (ref 3.4–10.8)

## 2020-05-01 NOTE — Addendum Note (Signed)
Addended by: Osa Craver on: 05/01/2020 08:57 AM   Modules accepted: Orders

## 2020-05-06 LAB — SPECIMEN STATUS REPORT

## 2020-05-06 LAB — IGE: IgE (Immunoglobulin E), Serum: 1808 IU/mL — ABNORMAL HIGH (ref 6–495)

## 2020-05-08 ENCOUNTER — Other Ambulatory Visit: Payer: Self-pay

## 2020-05-08 ENCOUNTER — Other Ambulatory Visit (HOSPITAL_COMMUNITY)
Admission: RE | Admit: 2020-05-08 | Discharge: 2020-05-08 | Disposition: A | Discharge status: Home / Self Care | Payer: 59 | Source: Ambulatory Visit | Attending: Family Medicine | Admitting: Family Medicine

## 2020-05-08 ENCOUNTER — Ambulatory Visit (INDEPENDENT_AMBULATORY_CARE_PROVIDER_SITE_OTHER): Payer: 59 | Admitting: Family Medicine

## 2020-05-08 VITALS — BP 112/84 | HR 93 | Ht 74.0 in | Wt 276.4 lb

## 2020-05-08 DIAGNOSIS — R5383 Other fatigue: Secondary | ICD-10-CM | POA: Diagnosis not present

## 2020-05-08 DIAGNOSIS — A630 Anogenital (venereal) warts: Secondary | ICD-10-CM | POA: Insufficient documentation

## 2020-05-08 HISTORY — DX: Anogenital (venereal) warts: A63.0

## 2020-05-08 NOTE — Patient Instructions (Signed)
1. Please follow up for fatigue in 1 month 2. We have gotten blood work today, we will either call or send a letter with results 3. Please follow up with urology

## 2020-05-08 NOTE — Assessment & Plan Note (Signed)
Patient with continued fatigue post covid. Multiple differentials. Can consider thyroid dysfunction especially given weight gain. Will plan to obtain TSH. Will need to r/o anemia with CBC. Will obtain BMP to r/o kidney dysfunction or electrolyte abnormality. Can consider hypogonadism however no issues with fertility. If above w/u is negative can consider w/u for hypogonadism. Can consider depression given elevated PHQ9, however, PHQ9 elevated 2/2 fatigue. Patient denies depression. Can consider post COVID reaction as well. Advised to f/u in 1 month, sooner if worsening

## 2020-05-08 NOTE — Progress Notes (Deleted)
    SUBJECTIVE:   CHIEF COMPLAINT / HPI:   Fatigue Patient reports having low energy, like he always wants to go back to sleep. Patient reports symptoms started after he was diagnosed with COVID in March. States he never went back to feeling 100%. Thinks he may have had some symptoms before COVID. No hair or skin changes. No diarrhea or constipations. No palpitations. No tremor. Patient gets 7-8hrs of sleep a night. Still feels tired when he wakes up. Takes a multivitamin daily. Does exercise daily. Does not snore in his sleep. Does have anxiety but no depression. No blood in stool or urine. No family history of colon cancer, but was told he has a family history of something that can lead to it. No thyroid disorders in the family. No drug, alcohol, tobacco use. Has children. No issues with infertility. Has gained 20+lbs during COVID 2/2 not working out as much.   Depression screen [facility] 2/9 05/08/2020 05/08/2020 03/05/2020 12/21/2019 12/20/2019  Decreased Interest 1 0 1 1 1   Down, Depressed, Hopeless 1 0 0 0 1  PHQ - 2 Score 2 0 1 1 2   Altered sleeping 0 - - 1 -  Tired, decreased energy 2 - - 1 -  Change in appetite 0 - - 0 -  Feeling bad or failure about yourself  0 - - 0 -  Trouble concentrating 1 - - 0 -  Moving slowly or fidgety/restless 0 - - 0 -  Suicidal thoughts 0 - - 0 -  PHQ-9 Score 5 - - 3 -    Cyst on leg Patient states he came to clinic 1 year ago and was told to see another doctor to get it removed but never went. Denies pain or irritation to area. No growth   PERTINENT  PMH / PSH: HTN, obesity, depression, anxiety   OBJECTIVE:   BP 112/84   Pulse 93   Ht 6\' 2"  (1.88 m)   Wt 276 lb 6.4 oz (125.4 kg)   SpO2 99%   BMI 35.49 kg/m   ***  ASSESSMENT/PLAN:   No problem-specific Assessment & Plan notes found for this encounter.     , DO [facility]

## 2020-05-08 NOTE — Progress Notes (Signed)
    SUBJECTIVE:   CHIEF COMPLAINT / HPI:   Fatigue Patient reports having low energy, like he always wants to go back to sleep. Patient reports symptoms started after he was diagnosed with COVID in March. States he never went back to feeling 100%. Thinks he may have had some symptoms before COVID. No hair or skin changes. No diarrhea or constipations. No palpitations. No tremor. Patient gets 7-8hrs of sleep a night. Still feels tired when he wakes up. Takes a multivitamin daily. Does exercise daily. Does not snore in his sleep. Does have anxiety but no depression. No blood in stool or urine. No family history of colon cancer, but was told he has a family history of something that can lead to it. No thyroid disorders in the family. No drug, alcohol, tobacco use. Has children. No issues with infertility. Has gained 20+lbs during COVID 2/2 not working out as much.   Depression screen [facility] 2/9 05/08/2020 05/08/2020 03/05/2020 12/21/2019 12/20/2019  Decreased Interest 1 0 1 1 1   Down, Depressed, Hopeless 1 0 0 0 1  PHQ - 2 Score 2 0 1 1 2   Altered sleeping 0 - - 1 -  Tired, decreased energy 2 - - 1 -  Change in appetite 0 - - 0 -  Feeling bad or failure about yourself  0 - - 0 -  Trouble concentrating 1 - - 0 -  Moving slowly or fidgety/restless 0 - - 0 -  Suicidal thoughts 0 - - 0 -  PHQ-9 Score 5 - - 3 -    Cyst on leg Patient states he came to clinic 1 year ago and was told to see another doctor to get it removed but never went. Denies pain or irritation to area. No growth   PERTINENT  PMH / PSH: HTN, obesity, depression, anxiety   OBJECTIVE:   BP 112/84   Pulse 93   Ht 6\' 2"  (1.88 m)   Wt 276 lb 6.4 oz (125.4 kg)   SpO2 99%   BMI 35.49 kg/m   Gen: awake and alert, NAD HEENT: moist mucous membranes, no thyromegaly or thyroid nodules Cardio: RRR, no MRG Resp: CTAB, no wheezes, rales or rhonchi  GI: soft, non tender, non distended, bowel sounds present Ext: no edema GU: condyloma noted  on right side of base of penis. Chaperone present   ASSESSMENT/PLAN:   Fatigue Patient with continued fatigue post covid. Multiple differentials. Can consider thyroid dysfunction especially given weight gain. Will plan to obtain TSH. Will need to r/o anemia with CBC. Will obtain BMP to r/o kidney dysfunction or electrolyte abnormality. Can consider hypogonadism however no issues with fertility. If above w/u is negative can consider w/u for hypogonadism. Can consider depression given elevated PHQ9, however, PHQ9 elevated 2/2 fatigue. Patient denies depression. Can consider post COVID reaction as well. Advised to f/u in 1 month, sooner if worsening  Condyloma Patient with condyloma and requests removal. Will plan to get STD testing to r/o other STDs. Will refer to urology for removal.      , DO [facility]

## 2020-05-08 NOTE — Assessment & Plan Note (Signed)
Patient with condyloma and requests removal. Will plan to get STD testing to r/o other STDs. Will refer to urology for removal.

## 2020-05-09 ENCOUNTER — Telehealth: Payer: Self-pay | Admitting: *Deleted

## 2020-05-09 LAB — TSH: TSH: 1.57 u[IU]/mL (ref 0.450–4.500)

## 2020-05-09 LAB — URINE CYTOLOGY ANCILLARY ONLY
Chlamydia: NEGATIVE
Comment: NEGATIVE
Comment: NEGATIVE
Comment: NORMAL
Neisseria Gonorrhea: NEGATIVE
Trichomonas: NEGATIVE

## 2020-05-09 LAB — HEPATITIS C ANTIBODY: Hep C Virus Ab: <0.1 s/co ratio (ref 0.0–0.9)

## 2020-05-09 LAB — BASIC METABOLIC PANEL
BUN/Creatinine Ratio: 11 (ref 9–20)
BUN: 15 mg/dL (ref 6–20)
CO2: 24 mmol/L (ref 20–29)
Calcium: 9.9 mg/dL (ref 8.7–10.2)
Chloride: 100 mmol/L (ref 96–106)
Creatinine, Ser: 1.34 mg/dL — ABNORMAL HIGH (ref 0.76–1.27)
GFR calc Af Amer: 79 mL/min/{1.73_m2} (ref >59)
GFR calc non Af Amer: 68 mL/min/{1.73_m2} (ref >59)
Glucose: 87 mg/dL (ref 65–99)
Potassium: 4.3 mmol/L (ref 3.5–5.2)
Sodium: 137 mmol/L (ref 134–144)

## 2020-05-09 LAB — CBC
Hematocrit: 46.6 % (ref 37.5–51.0)
Hemoglobin: 15.6 g/dL (ref 13.0–17.7)
MCH: 26.7 pg (ref 26.6–33.0)
MCHC: 33.5 g/dL (ref 31.5–35.7)
MCV: 80 fL (ref 79–97)
Platelets: 177 10*3/uL (ref 150–450)
RBC: 5.84 x10E6/uL — ABNORMAL HIGH (ref 4.14–5.80)
RDW: 14.9 % (ref 11.6–15.4)
WBC: 4 10*3/uL (ref 3.4–10.8)

## 2020-05-09 LAB — HIV ANTIBODY (ROUTINE TESTING W REFLEX): HIV Screen 4th Generation wRfx: NONREACTIVE

## 2020-05-09 LAB — RPR: RPR Ser Ql: NONREACTIVE

## 2020-05-09 NOTE — Telephone Encounter (Signed)
Pt informed. Azra Abrell, CMA  

## 2020-05-09 NOTE — Telephone Encounter (Signed)
Letter to home for lab results.  °

## 2020-05-09 NOTE — Telephone Encounter (Signed)
-----   Message from Oralia Manis, DO sent at 05/09/2020 11:37 AM EDT ----- Please inform patient that results of labs are negative. Cr is similar to previous

## 2020-05-19 ENCOUNTER — Ambulatory Visit: Payer: 59 | Admitting: Allergy

## 2020-06-09 ENCOUNTER — Ambulatory Visit: Payer: 59 | Admitting: Family Medicine

## 2020-06-12 ENCOUNTER — Telehealth: Payer: Self-pay | Admitting: *Deleted

## 2020-06-12 NOTE — Telephone Encounter (Signed)
Pt called and I reviewed his lab results with him. He states his asthma has been doing better.

## 2020-06-23 ENCOUNTER — Other Ambulatory Visit: Payer: Self-pay | Admitting: Family Medicine

## 2020-06-23 DIAGNOSIS — I10 Essential (primary) hypertension: Secondary | ICD-10-CM

## 2020-07-25 ENCOUNTER — Ambulatory Visit: Payer: 59 | Admitting: Allergy

## 2020-09-02 ENCOUNTER — Encounter (HOSPITAL_BASED_OUTPATIENT_CLINIC_OR_DEPARTMENT_OTHER): Payer: Self-pay | Admitting: Emergency Medicine

## 2020-09-02 ENCOUNTER — Other Ambulatory Visit: Payer: Self-pay

## 2020-09-02 ENCOUNTER — Emergency Department (HOSPITAL_BASED_OUTPATIENT_CLINIC_OR_DEPARTMENT_OTHER)
Admission: EM | Admit: 2020-09-02 | Discharge: 2020-09-02 | Disposition: A | Discharge status: Home / Self Care | Payer: 59 | Attending: Emergency Medicine | Admitting: Emergency Medicine

## 2020-09-02 DIAGNOSIS — I10 Essential (primary) hypertension: Secondary | ICD-10-CM | POA: Diagnosis not present

## 2020-09-02 DIAGNOSIS — J45909 Unspecified asthma, uncomplicated: Secondary | ICD-10-CM | POA: Diagnosis not present

## 2020-09-02 DIAGNOSIS — J029 Acute pharyngitis, unspecified: Secondary | ICD-10-CM | POA: Insufficient documentation

## 2020-09-02 DIAGNOSIS — Z79899 Other long term (current) drug therapy: Secondary | ICD-10-CM | POA: Insufficient documentation

## 2020-09-02 DIAGNOSIS — Z7951 Long term (current) use of inhaled steroids: Secondary | ICD-10-CM | POA: Diagnosis not present

## 2020-09-02 LAB — GROUP A STREP BY PCR: Group A Strep by PCR: NOT DETECTED

## 2020-09-02 MED ORDER — DEXAMETHASONE SODIUM PHOSPHATE 10 MG/ML IJ SOLN
10.0000 mg | Freq: Once | INTRAMUSCULAR | Status: AC
  Administered 2020-09-02: 10 mg via INTRAMUSCULAR
  Filled 2020-09-02: qty 1

## 2020-09-02 NOTE — ED Provider Notes (Signed)
[facility] Provider Note: Lowella Dell, MD, FACEP  CSN: [id number] MRN: [id number] ARRIVAL: 09/02/20 at 0031 ROOM: [facility]07/[facility]07   CHIEF COMPLAINT  Sore Throat   HISTORY OF PRESENT ILLNESS  09/02/20 3:23 AM Scott Patterson is a 34 y.o. male with a 2-day history of a sore throat.  He describes the pain as a dry feeling.  It is worse with swallowing.  He rates it as an 8 out of 10.  He does not know if he has had a fever.  He does have some anterior cervical lymphadenopathy.  He denies other cold type symptoms such as cough, nasal congestion or shortness of breath.   Past Medical History:  Diagnosis Date  . Anxiety   . Asthma   . Depression   . Genital herpes 04/02/2011  . Hypertension     Past Surgical History:  Procedure Laterality Date  . None      Family History  Problem Relation Age of Onset  . Depression Mother   . Hypertension Mother   . Allergic rhinitis Mother   . Alcohol abuse Father   . Drug abuse Father   . Stroke Father   . Hypertension Father   . Allergic rhinitis Father   . Alcohol abuse Paternal Uncle   . Hypertension Brother   . Angioedema Neg Hx   . Asthma Neg Hx   . Atopy Neg Hx   . Eczema Neg Hx   . Immunodeficiency Neg Hx   . Urticaria Neg Hx     Social History   Tobacco Use  . Smoking status: Never Smoker  . Smokeless tobacco: Never Used  Vaping Use  . Vaping Use: Never used  Substance Use Topics  . Alcohol use: No  . Drug use: No    Prior to Admission medications   Medication Sig Start Date End Date Taking? Authorizing Provider  albuterol (VENTOLIN HFA) 108 (90 Base) MCG/ACT inhaler Inhale 1-2 puffs into the lungs every 6 (six) hours as needed for wheezing or shortness of breath. 04/23/20   Marcelyn Bruins, MD  Azelastine-Fluticasone [id number] MCG/ACT SUSP Place 1 spray into the nose in the morning and at bedtime. 04/23/20   Marcelyn Bruins, MD  fluticasone furoate-vilanterol (BREO ELLIPTA) 200-25 MCG/INH  AEPB Inhale 1 puff into the lungs daily. 04/23/20   Marcelyn Bruins, MD  hydrochlorothiazide (HYDRODIURIL) 12.5 MG tablet TAKE 1 TABLET BY MOUTH EVERY DAY 06/24/20   Lilland, Alana, DO  hydrOXYzine (ATARAX/VISTARIL) 10 MG tablet Take 1 tablet (10 mg total) by mouth 3 (three) times daily as needed. 03/05/20   Shirley, [place], DO  levocetirizine (XYZAL) 5 MG tablet Take 1 tablet (5 mg total) by mouth every evening. 04/23/20   Marcelyn Bruins, MD  montelukast (SINGULAIR) 10 MG tablet Take 1 tablet (10 mg total) by mouth at bedtime. 04/23/20   Marcelyn Bruins, MD  Olopatadine HCl 0.2 % SOLN Apply 1 drop to eye daily. 04/23/20   Marcelyn Bruins, MD  Spacer/Aero-Hold Chamber Bags MISC Use with inhaler 03/22/19   Westley Chandler, MD  fluticasone [facility]) 50 MCG/ACT nasal spray Place 1 spray into both nostrils every morning. 06/26/19   Shirley, [place], DO  fluticasone [facility]) 50 MCG/ACT nasal spray PLACE 1 SPRAY INTO BOTH NOSTRILS EVERY MORNING. 08/02/19   Shirley, [place], DO  fluticasone [facility]) 50 MCG/ACT nasal spray Place 1 spray into both nostrils daily. 01/21/20   Shirley, [place], DO  montelukast (SINGULAIR) 10 MG tablet Take 1  tablet (10 mg total) by mouth at bedtime. 07/11/19   Shirley, [place], DO    Allergies Patient has no known allergies.   REVIEW OF SYSTEMS  Negative except as noted here or in the History of Present Illness.   PHYSICAL EXAMINATION  Initial Vital Signs Blood pressure 127/83, pulse 60, temperature 98.1 F (36.7 C), temperature source Oral, resp. rate 14, height 6\' 2"  (1.88 m), weight 115.7 kg, SpO2 100 %.  Examination General: Well-developed, well-nourished male in no acute distress; appearance consistent with age of record HENT: normocephalic; atraumatic; no pharyngeal erythema or exudate Eyes: pupils equal, round and reactive to light; extraocular muscles intact Neck: supple; mild anterior cervical lymphadenopathy Heart: regular rate  and rhythm Lungs: clear to auscultation bilaterally Abdomen: soft; nondistended; nontender; bowel sounds present Extremities: No deformity; full range of motion; pulses normal Neurologic: Awake, alert and oriented; motor function intact in all extremities and symmetric; no facial droop Skin: Warm and dry Psychiatric: Normal mood and affect   RESULTS  Summary of this visit's results, reviewed and interpreted by myself:   EKG Interpretation  Date/Time:    Ventricular Rate:    PR Interval:    QRS Duration:   QT Interval:    QTC Calculation:   R Axis:     Text Interpretation:        Laboratory Studies: Results for orders placed or performed during the hospital encounter of 09/02/20 (from the past 24 hour(s))  Group A Strep by PCR     Status: None   Collection Time: 09/02/20 12:44 AM   Specimen: Throat; Sterile Swab  Result Value Ref Range   Group A Strep by PCR NOT DETECTED NOT DETECTED   Imaging Studies: No results found.  ED COURSE and MDM  Nursing notes, initial and subsequent vitals signs, including pulse oximetry, reviewed and interpreted by myself.  Vitals:   09/02/20 0038 09/02/20 0040 09/02/20 0223  BP:  130/86 127/83  Pulse:  83 60  Resp:  16 14  Temp:  98 F (36.7 C) 98.1 F (36.7 C)  TempSrc:  Oral Oral  SpO2:  97% 100%  Weight: 115.7 kg    Height: 6\' 2"  (1.88 m)     Medications  dexamethasone (DECADRON) injection 10 mg (has no administration in time range)    Patient strep is negative and his throat is normal on examination.  I suspect this is a viral process.  He may get relief from dexamethasone and he agrees to this.  PROCEDURES  Procedures   ED DIAGNOSES     ICD-10-CM   1. Viral pharyngitis  J02.9        Viviana Trimble, MD 09/02/20 [phone]

## 2020-09-02 NOTE — ED Notes (Signed)
Gave patient gingerale and icee prior to discharge.

## 2020-09-02 NOTE — ED Triage Notes (Signed)
Pt is c/o sore throat x 2 days   

## 2020-09-15 ENCOUNTER — Ambulatory Visit: Payer: 59

## 2020-09-16 ENCOUNTER — Other Ambulatory Visit: Payer: Self-pay

## 2020-09-16 ENCOUNTER — Ambulatory Visit (INDEPENDENT_AMBULATORY_CARE_PROVIDER_SITE_OTHER): Payer: 59 | Admitting: Family Medicine

## 2020-09-16 VITALS — BP 118/72 | HR 73 | Wt 273.4 lb

## 2020-09-16 DIAGNOSIS — R519 Headache, unspecified: Secondary | ICD-10-CM | POA: Diagnosis not present

## 2020-09-16 DIAGNOSIS — G8929 Other chronic pain: Secondary | ICD-10-CM

## 2020-09-16 DIAGNOSIS — J3089 Other allergic rhinitis: Secondary | ICD-10-CM

## 2020-09-16 DIAGNOSIS — I10 Essential (primary) hypertension: Secondary | ICD-10-CM

## 2020-09-16 DIAGNOSIS — Z23 Encounter for immunization: Secondary | ICD-10-CM

## 2020-09-16 DIAGNOSIS — E669 Obesity, unspecified: Secondary | ICD-10-CM

## 2020-09-16 DIAGNOSIS — Z6830 Body mass index (BMI) 30.0-30.9, adult: Secondary | ICD-10-CM

## 2020-09-16 NOTE — Progress Notes (Signed)
    SUBJECTIVE:   CHIEF COMPLAINT / HPI:   Scott Patterson is a 34 yo M who presents for the below issue.   Headache 2 weeks ago. Takes his blood pressure regularly. On either side of head and throbbing. Was bad 2-3 days ago. In temporal region. Taking ibuprofen 800mg  every 4-6 hours and helping slightly. Has times where he is jumping out of sleeping and feeling like he is having to catch his breath. Has been to an allergist for chronic allergic rhinitis. Was asked to get a sleep study for possible OSA in the past but failed to do so. Endorses chronic headaches since last year. Denies caffeine use, nausea, vomiting, or headache relief from standing.  PERTINENT  PMH / PSH: HTN, obesity, asthma, depression  OBJECTIVE:   BP 118/72   Pulse 73   Wt 273 lb 6.4 oz (124 kg)   SpO2 99%   BMI 35.10 kg/m   General: Appears well, no acute distress. Age appropriate. Neuro: alert and oriented, CN II-XII grossly intact, no focal deficits PHQ9 SCORE ONLY 09/16/2020 05/08/2020 05/08/2020  PHQ-9 Total Score 9 5 0    ASSESSMENT/PLAN:   Chronic intractable headache Chronic. No red flags today. BP is stable. Likely mixed pictured with chronic allergic rhinitis and suspected OSA. Will continue to work with allergist on controlling rhinitis. Will send for sleep study. If normal will consider head imaging to look for idiopathic intracranial hypertension. Starting with a sleep study is most appropriate considering his symptoms of nocturnal apnea spells.  -Sleep on side -Sleep study ordered -Follow up with allergist -Return precautions discussed.   ESSENTIAL HYPERTENSION, BENIGN BP at goal. Not likely source of headache.  -Continue medications  Allergic rhinitis Continues to endorse rhinits while on several different medications. Could possibly be contributory to ongoing headaches.  -Continue with medications -Follow up with allergist  OBESITY, NOS Suspected OSA w/ symptoms. Increased chest wall weight  contributory.+ -Sleep study as above.   Encounter for immunization -Pfizer COVID vaccine x1 -F/u for 2nd shot in 21 days   05/10/2020, DO [facility]

## 2020-09-16 NOTE — Patient Instructions (Addendum)
It was wonderful to see you today.  Please bring ALL of your medications with you to every visit.   Today we talked about:  Your headaches. These may be related to your uncontrolled allergic rhinitis. We also discussed getting a sleep study to rule out sleep apnea being a cause of poor oxygen flow to the brain that could cause headaches.    Please call the clinic at [phone] if your symptoms worsen or you have any concerns. It was our pleasure to serve you.  Dr. Salvadore Dom

## 2020-09-16 NOTE — Progress Notes (Signed)
   Covid-19 Vaccination Clinic  Name:  Scott Patterson    MRN: [id number] DOB: [birth year]  09/16/2020  [name] was observed post Covid-19 immunization for 15 minutes without incident. He was provided with Vaccine Information Sheet and instruction to access the V-Safe system.   [name] was instructed to call 911 with any severe reactions post vaccine: Marland Kitchen Difficulty breathing  . Swelling of face and throat  . A fast heartbeat  . A bad rash all over body  . Dizziness and weakness

## 2020-09-20 DIAGNOSIS — R519 Headache, unspecified: Secondary | ICD-10-CM | POA: Insufficient documentation

## 2020-09-20 DIAGNOSIS — Z23 Encounter for immunization: Secondary | ICD-10-CM | POA: Insufficient documentation

## 2020-09-20 NOTE — Assessment & Plan Note (Signed)
Suspected OSA w/ symptoms. Increased chest wall weight contributory.+ -Sleep study as above.

## 2020-09-20 NOTE — Assessment & Plan Note (Signed)
Continues to endorse rhinits while on several different medications. Could possibly be contributory to ongoing headaches.  -Continue with medications -Follow up with allergist

## 2020-09-20 NOTE — Assessment & Plan Note (Signed)
-  Pfizer COVID vaccine x1 -F/u for 2nd shot in 21 days

## 2020-09-20 NOTE — Assessment & Plan Note (Signed)
BP at goal. Not likely source of headache.  -Continue medications

## 2020-09-20 NOTE — Assessment & Plan Note (Signed)
Chronic. No red flags today. BP is stable. Likely mixed pictured with chronic allergic rhinitis and suspected OSA. Will continue to work with allergist on controlling rhinitis. Will send for sleep study. If normal will consider head imaging to look for idiopathic intracranial hypertension. Starting with a sleep study is most appropriate considering his symptoms of nocturnal apnea spells.  -Sleep on side -Sleep study ordered -Follow up with allergist -Return precautions discussed.

## 2020-09-30 ENCOUNTER — Other Ambulatory Visit: Payer: Self-pay | Admitting: *Deleted

## 2020-09-30 DIAGNOSIS — J4540 Moderate persistent asthma, uncomplicated: Secondary | ICD-10-CM

## 2020-09-30 MED ORDER — ALBUTEROL SULFATE HFA 108 (90 BASE) MCG/ACT IN AERS: 1.0000 | INHALATION_SPRAY | Freq: Four times a day (QID) | RESPIRATORY_TRACT | 0 refills | Status: DC | PRN

## 2020-10-07 ENCOUNTER — Ambulatory Visit: Payer: 59

## 2020-10-08 ENCOUNTER — Ambulatory Visit: Payer: 59 | Admitting: Family Medicine

## 2020-10-13 ENCOUNTER — Ambulatory Visit (INDEPENDENT_AMBULATORY_CARE_PROVIDER_SITE_OTHER): Payer: 59 | Admitting: Family Medicine

## 2020-10-13 ENCOUNTER — Encounter: Payer: Self-pay | Admitting: Family Medicine

## 2020-10-13 ENCOUNTER — Other Ambulatory Visit: Payer: Self-pay

## 2020-10-13 VITALS — BP 110/80 | HR 53 | Ht 74.0 in | Wt 274.0 lb

## 2020-10-13 DIAGNOSIS — Z23 Encounter for immunization: Secondary | ICD-10-CM

## 2020-10-13 DIAGNOSIS — R519 Headache, unspecified: Secondary | ICD-10-CM | POA: Diagnosis not present

## 2020-10-13 DIAGNOSIS — J4540 Moderate persistent asthma, uncomplicated: Secondary | ICD-10-CM

## 2020-10-13 DIAGNOSIS — J4520 Mild intermittent asthma, uncomplicated: Secondary | ICD-10-CM

## 2020-10-13 DIAGNOSIS — I10 Essential (primary) hypertension: Secondary | ICD-10-CM

## 2020-10-13 MED ORDER — HYDROCHLOROTHIAZIDE 12.5 MG PO TABS: 12.5000 mg | ORAL_TABLET | Freq: Every day | ORAL | 3 refills | Status: DC

## 2020-10-13 MED ORDER — ALBUTEROL SULFATE HFA 108 (90 BASE) MCG/ACT IN AERS: 1.0000 | INHALATION_SPRAY | Freq: Four times a day (QID) | RESPIRATORY_TRACT | 0 refills | Status: DC | PRN

## 2020-10-13 NOTE — Assessment & Plan Note (Signed)
Patient receiving second Covid vaccine.  Risks and benefits reviewed.  Patient declines flu vaccine.

## 2020-10-13 NOTE — Progress Notes (Signed)
    SUBJECTIVE:   CHIEF COMPLAINT / HPI:   Headaches: Patient reports that his headaches have been getting better, he is only having light headaches 2-3 times per week that resolved with Tylenol.  He reports occasional light headaches upon awakening, no headaches awaken him from sleep and he has had no vision changes or other symptoms associated.  Patient still has not had the sleep study, he is interested considering he has fatigue during the day and does have nighttime awakenings with gasping and obese with a BMI of 35.  Hypertension: Patient has been moderately well controlled in the last several visits, today blood pressure is 110/80 and he has not had any symptoms.  2nd Covid vaccine: Patient is here to also get a second Covid vaccine, risks and benefits of the shot were discussed as well as possible reactions.  Patient reports that in April 2020 he was hospitalized in [place] for 2 weeks and subsequently had blood clots and pneumonia associated with Covid while there.  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  He is seen by dermatology and has been on doxycycline on and off for the last several years.  He was recently changed to doxycycline daily but ran out of his medications in the last several days.  He has an appointment scheduled on Wednesday 11/3 for follow-up and refill.    PERTINENT  PMH / PSH: Reviewed  OBJECTIVE:   BP 110/80   Pulse (!) 53   Ht 6\' 2"  (1.88 m)   Wt 274 lb (124.3 kg)   SpO2 97%   BMI 35.18 kg/m   Gen: Well-appearing, obese, in a pleasant mood CV: RRR, no M/R/G appreciated, 2+ peripheral pulses Pulmonary: CTAB, no wheezes appreciated, normal work of breathing Neuro: PERRLA, CN II through XII grossly intact  ASSESSMENT/PLAN:   ESSENTIAL HYPERTENSION, BENIGN BP 110/80 in clinic today.  Patient asymptomatic.  Refilling HCTZ  Encounter for immunization Patient receiving second Covid vaccine.  Risks and benefits  reviewed.  Patient declines flu vaccine.  Generalized headaches 2-3 times per week resolved with Tylenol.  Light headaches upon awakening with nighttime awakening from breathing. Sleep study referral placed  Asthma, mild intermittent Refilling albuterol, need reassessment of asthma and medications to ensure adequate treatment.     , DO [facility]

## 2020-10-13 NOTE — Patient Instructions (Signed)
It was a great meeting you today! Today we discussed the following:  Headaches:  Due to waking up in the morning with light headaches and feeling tired and fatigued throughout the day, this may be a sign of sleep apnea.  I do recommend following up with the sleep study, I will look and make sure there is a referral placed and we will contact you to see what you need to do for the next steps.  Second Covid vaccine: You may experience some achiness and mild sickness feelings, this should go away within the next several days.  If you start to notice any difficulty with breathing please contact the office.  MRSA: Please ensure that you make your dermatology appointment on Wednesday for your doxycycline refill and follow-up with your abdominal wart.

## 2020-10-13 NOTE — Assessment & Plan Note (Signed)
BP 110/80 in clinic today.  Patient asymptomatic.  Refilling HCTZ

## 2020-10-13 NOTE — Assessment & Plan Note (Signed)
Refilling albuterol, need reassessment of asthma and medications to ensure adequate treatment.

## 2020-10-13 NOTE — Assessment & Plan Note (Addendum)
2-3 times per week resolved with Tylenol.  Light headaches upon awakening with nighttime awakening from breathing. Sleep study referral placed

## 2020-10-30 ENCOUNTER — Telehealth: Payer: Self-pay | Admitting: *Deleted

## 2020-10-30 NOTE — Telephone Encounter (Signed)
Received call from [facility] and [facility] (insurance) has denied his sleep study.  Will forward to MD to update her.  They will likely send over a fax with information if there is a chance to appeal.  Rylen Hou,CMA

## 2020-10-31 NOTE — Telephone Encounter (Signed)
Called patient in regards to denial of sleep study from insurance.  Patient does feel that may have a component of sleep apnea that is affecting his life at the moment.  Scheduled appointment with me for 11/23 at 11 AM to follow-up with more in-depth sleep and to complete screenings to see if he would qualify for sleep study.  Scott Michaelis, DO

## 2020-11-04 ENCOUNTER — Other Ambulatory Visit: Payer: Self-pay

## 2020-11-04 ENCOUNTER — Ambulatory Visit (INDEPENDENT_AMBULATORY_CARE_PROVIDER_SITE_OTHER): Payer: 59 | Admitting: Family Medicine

## 2020-11-04 ENCOUNTER — Encounter: Payer: Self-pay | Admitting: Family Medicine

## 2020-11-04 VITALS — BP 128/82 | HR 94 | Ht 72.0 in | Wt 272.0 lb

## 2020-11-04 DIAGNOSIS — Z72821 Inadequate sleep hygiene: Secondary | ICD-10-CM

## 2020-11-04 DIAGNOSIS — R29818 Other symptoms and signs involving the nervous system: Secondary | ICD-10-CM | POA: Diagnosis not present

## 2020-11-04 HISTORY — DX: Inadequate sleep hygiene: Z72.821

## 2020-11-04 NOTE — Assessment & Plan Note (Addendum)
Patient STOP BANG showed high risk for OSA.  Patient reports gasping for breath during the night and has been witnessed to stop breathing while sleeping.  BMI is over 35 and neck circumference is over 40 cm. -Sleep study ordered for evaluation of OSA -Patient given handout and discussed the content in the room.

## 2020-11-04 NOTE — Progress Notes (Signed)
    SUBJECTIVE:   CHIEF COMPLAINT / HPI:   Poor sleep/concern for OSA: Patient reports that he has been having a lot of problems with sleep which has led to tiredness and fatigue during the day, as well as morning headaches.  He has been having concerns because he has had episodes where he has woken up gasping in the middle of the night.  Patient states that he has been told before that he stops breathing in his sleep.  Patient reports that he is not taking alcohol before sleep, but states that he does drink a lot of water throughout the night which causes him to use the restroom frequently in the night.  Current sleep schedule: -12 AM: falls asleep on couch watching TV -2 AM: Wakes up, occasionally startled in odd positions on the couch, then goes to the bedroom. -2-2:30 AM: Restless movements while attempting to fall asleep -5:20 AM: Alarm sounds, patient continues to snooze until about 6 AM -6 AM: Wakes up not well rested   STOP BANG: High Risk Do you snore loudly?  Yes Do you often feel tired, fatigued, or sleepy during the daytime? Yes Has anyone observed you stop breathing during sleep? Yes Do you have (already being treated for) high blood pressure? Yes BMI: 36.89 kg/m Age:34 Neck circumference: 44cm Gender: Male   PERTINENT  PMH / PSH: HTN, asthma, depression and anxiety, obesity  OBJECTIVE:   BP 128/82   Pulse 94   Ht 6' (1.829 m)   Wt 272 lb (123.4 kg)   SpO2 98%   BMI 36.89 kg/m   Gen: NAD, sitting in clinic, obese HEENT: Tonsils appear normal in size, no obvious oropharyngeal overcrowding appreciated Resp: CTAB, normal WOB, no wheezes appreciated Psych: appropriate mood and affect  ASSESSMENT/PLAN:   Suspected sleep apnea Patient STOP BANG showed high risk for OSA.  Patient reports gasping for breath during the night and has been witnessed to stop breathing while sleeping.  BMI is over 35 and neck circumference is over 40 cm. -Sleep study ordered for  evaluation of OSA -Patient given handout and discussed the content in the room.  Inadequate sleep hygiene See HPI for detailed sleep schedule.  Completed in depth counseling with patient on adequate sleep hygiene and appropriate sleep routines.  Patient was given handouts on appropriate sleep hygiene.  Recommended the following: -Earlier initiation of sleep about 10 PM, bedroom should be dark, cool, quiet and the only place that he sleeps -Elimination of blue light and electronic devices 30 minutes to 1 hour before bed -Elimination of drinking liquids 2 hours before bed -Gradual onset alarm clock -Delay his alarm until 6 AM and no hitting the snooze button -Place phone across the room so that you have to get up to physically turn off the alarm     Scott Mcelhiney, DO [facility]

## 2020-11-04 NOTE — Patient Instructions (Addendum)
It was a great seeing you today!  Today we discussed the following:  Sleep hygiene: We definitely identified some areas of your sleeping patterns that do need some work.  The recommendations are to not fall asleep in the living room while watching TV, but instead try to make the bedroom in a dark quiet place around 10 PM to fall asleep and.  Changing her alarm until 6 AM, and making sure there is a gradual alarm and not hitting the snooze button is very important.  And alert recommendation is to not drink any liquids about 2 hours before going to bed, this is to avoid nighttime wakening to go to the bathroom.  Sleep apnea: Your screening tool for sleep apnea is pretty high, especially considering you have times when you stop breathing during sleep.  I am sending in another sleep study referral, we will let you know if we have any problems with getting a phone improved.        Quality Sleep Information, Adult Quality sleep is important for your mental and physical health. It also improves your quality of life. Quality sleep means you:  Are asleep for most of the time you are in bed.  Fall asleep within 30 minutes.  Wake up no more than once a night.  Are awake for no longer than 20 minutes if you do wake up during the night. Most adults need 7-8 hours of quality sleep each night. How can poor sleep affect me? If you do not get enough quality sleep, you may have:  Mood swings.  Daytime sleepiness.  Confusion.  Decreased reaction time.  Sleep disorders, such as insomnia and sleep apnea.  Difficulty with: ? Solving problems. ? Coping with stress. ? Paying attention. These issues may affect your performance and productivity at work, school, and at home. Lack of sleep may also put you at higher risk for accidents, suicide, and risky behaviors. If you do not get quality sleep you may also be at higher risk for several health problems, including:  Infections.  Type 2  diabetes.  Heart disease.  High blood pressure.  Obesity.  Worsening of long-term conditions, like arthritis, kidney disease, depression, Parkinson's disease, and epilepsy. What actions can I take to get more quality sleep?      Stick to a sleep schedule. Go to sleep and wake up at about the same time each day. Do not try to sleep less on weekdays and make up for lost sleep on weekends. This does not work.  Try to get about 30 minutes of exercise on most days. Do not exercise 2-3 hours before going to bed.  Limit naps during the day to 30 minutes or less.  Do not use any products that contain nicotine or tobacco, such as cigarettes or e-cigarettes. If you need help quitting, ask your health care provider.  Do not drink caffeinated beverages for at least 8 hours before going to bed. Coffee, tea, and some sodas contain caffeine.  Do not drink alcohol close to bedtime.  Do not eat large meals close to bedtime.  Do not take naps in the late afternoon.  Try to get at least 30 minutes of sunlight every day. Morning sunlight is best.  Make time to relax before bed. Reading, listening to music, or taking a hot bath promotes quality sleep.  Make your bedroom a place that promotes quality sleep. Keep your bedroom dark, quiet, and at a comfortable room temperature. Make sure your bed is comfortable.  Take out sleep distractions like TV, a computer, smartphone, and bright lights.  If you are lying awake in bed for longer than 20 minutes, get up and do a relaxing activity until you feel sleepy.  Work with your health care provider to treat medical conditions that may affect sleeping, such as: ? Nasal obstruction. ? Snoring. ? Sleep apnea and other sleep disorders.  Talk to your health care provider if you think any of your prescription medicines may cause you to have difficulty falling or staying asleep.  If you have sleep problems, talk with a sleep consultant. If you think you have a  sleep disorder, talk with your health care provider about getting evaluated by a specialist. Where to find more information  National Sleep Foundation website: https://sleepfoundation.org  National Heart, Lung, and Blood Institute (NHLBI): https://hall.info/.pdf  Centers for Disease Control and Prevention (CDC): DetailSports.is Contact a health care provider if you:  Have trouble getting to sleep or staying asleep.  Often wake up very early in the morning and cannot get back to sleep.  Have daytime sleepiness.  Have daytime sleep attacks of suddenly falling asleep and sudden muscle weakness (narcolepsy).  Have a tingling sensation in your legs with a strong urge to move your legs (restless legs syndrome).  Stop breathing briefly during sleep (sleep apnea).  Think you have a sleep disorder or are taking a medicine that is affecting your quality of sleep. Summary  Most adults need 7-8 hours of quality sleep each night.  Getting enough quality sleep is an important part of health and well-being.  Make your bedroom a place that promotes quality sleep and avoid things that may cause you to have poor sleep, such as alcohol, caffeine, smoking, and large meals.  Talk to your health care provider if you have trouble falling asleep or staying asleep. This information is not intended to replace advice given to you by your health care provider. Make sure you discuss any questions you have with your health care provider. Document Revised: 03/08/2018 Document Reviewed: 03/08/2018 Elsevier Patient Education  2020 Elsevier Inc.       Sleep Apnea Sleep apnea affects breathing during sleep. It causes breathing to stop for a short time or to become shallow. It can also increase the risk of:  Heart attack.  Stroke.  Being very overweight (obese).  Diabetes.  Heart failure.  Irregular heartbeat. The goal of treatment is to help you  breathe normally again. What are the causes? There are three kinds of sleep apnea:  Obstructive sleep apnea. This is caused by a blocked or collapsed airway.  Central sleep apnea. This happens when the brain does not send the right signals to the muscles that control breathing.  Mixed sleep apnea. This is a combination of obstructive and central sleep apnea. The most common cause of this condition is a collapsed or blocked airway. This can happen if:  Your throat muscles are too relaxed.  Your tongue and tonsils are too large.  You are overweight.  Your airway is too small. What increases the risk?  Being overweight.  Smoking.  Having a small airway.  Being older.  Being male.  Drinking alcohol.  Taking medicines to calm yourself (sedatives or tranquilizers).  Having family members with the condition. What are the signs or symptoms?  Trouble staying asleep.  Being sleepy or tired during the day.  Getting angry a lot.  Loud snoring.  Headaches in the morning.  Not being able to focus your  mind (concentrate).  Forgetting things.  Less interest in sex.  Mood swings.  Personality changes.  Feelings of sadness (depression).  Waking up a lot during the night to pee (urinate).  Dry mouth.  Sore throat. How is this diagnosed?  Your medical history.  A physical exam.  A test that is done when you are sleeping (sleep study). The test is most often done in a sleep lab but may also be done at home. How is this treated?   Sleeping on your side.  Using a medicine to get rid of mucus in your nose (decongestant).  Avoiding the use of alcohol, medicines to help you relax, or certain pain medicines (narcotics).  Losing weight, if needed.  Changing your diet.  Not smoking.  Using a machine to open your airway while you sleep, such as: ? An oral appliance. This is a mouthpiece that shifts your lower jaw forward. ? A CPAP device. This device blows  air through a mask when you breathe out (exhale). ? An EPAP device. This has valves that you put in each nostril. ? A BPAP device. This device blows air through a mask when you breathe in (inhale) and breathe out.  Having surgery if other treatments do not work. It is important to get treatment for sleep apnea. Without treatment, it can lead to:  High blood pressure.  Coronary artery disease.  In men, not being able to have an erection (impotence).  Reduced thinking ability. Follow these instructions at home: Lifestyle  Make changes that your doctor recommends.  Eat a healthy diet.  Lose weight if needed.  Avoid alcohol, medicines to help you relax, and some pain medicines.  Do not use any products that contain nicotine or tobacco, such as cigarettes, e-cigarettes, and chewing tobacco. If you need help quitting, ask your doctor. General instructions  Take over-the-counter and prescription medicines only as told by your doctor.  If you were given a machine to use while you sleep, use it only as told by your doctor.  If you are having surgery, make sure to tell your doctor you have sleep apnea. You may need to bring your device with you.  Keep all follow-up visits as told by your doctor. This is important. Contact a doctor if:  The machine that you were given to use during sleep bothers you or does not seem to be working.  You do not get better.  You get worse. Get help right away if:  Your chest hurts.  You have trouble breathing in enough air.  You have an uncomfortable feeling in your back, arms, or stomach.  You have trouble talking.  One side of your body feels weak.  A part of your face is hanging down. These symptoms may be an emergency. Do not wait to see if the symptoms will go away. Get medical help right away. Call your local emergency services (911 in the [place] Do not drive yourself to the hospital. Summary  This condition affects breathing during  sleep.  The most common cause is a collapsed or blocked airway.  The goal of treatment is to help you breathe normally while you sleep. This information is not intended to replace advice given to you by your health care provider. Make sure you discuss any questions you have with your health care provider. Document Revised: 09/15/2018 Document Reviewed: 07/25/2018 Elsevier Patient Education  2020 [employer].

## 2020-11-04 NOTE — Assessment & Plan Note (Addendum)
See HPI for detailed sleep schedule.  Completed in depth counseling with patient on adequate sleep hygiene and appropriate sleep routines.  Patient was given handouts on appropriate sleep hygiene.  Recommended the following: -Earlier initiation of sleep about 10 PM, bedroom should be dark, cool, quiet and the only place that he sleeps -Elimination of blue light and electronic devices 30 minutes to 1 hour before bed -Elimination of drinking liquids 2 hours before bed -Gradual onset alarm clock -Delay his alarm until 6 AM and no hitting the snooze button -Place phone across the room so that you have to get up to physically turn off the alarm

## 2021-02-11 ENCOUNTER — Other Ambulatory Visit: Payer: Self-pay | Admitting: Allergy

## 2021-02-11 DIAGNOSIS — J4540 Moderate persistent asthma, uncomplicated: Secondary | ICD-10-CM

## 2021-02-12 ENCOUNTER — Other Ambulatory Visit: Payer: Self-pay

## 2021-02-12 DIAGNOSIS — J4520 Mild intermittent asthma, uncomplicated: Secondary | ICD-10-CM

## 2021-02-13 MED ORDER — BREO ELLIPTA 200-25 MCG/INH IN AEPB: 1.0000 | INHALATION_SPRAY | Freq: Every day | RESPIRATORY_TRACT | 5 refills | Status: DC

## 2021-03-25 ENCOUNTER — Ambulatory Visit (INDEPENDENT_AMBULATORY_CARE_PROVIDER_SITE_OTHER): Payer: 59 | Admitting: Family Medicine

## 2021-03-25 ENCOUNTER — Other Ambulatory Visit: Payer: Self-pay

## 2021-03-25 ENCOUNTER — Encounter: Payer: Self-pay | Admitting: Family Medicine

## 2021-03-25 VITALS — BP 123/80 | Ht 72.0 in | Wt 265.8 lb

## 2021-03-25 DIAGNOSIS — R1010 Upper abdominal pain, unspecified: Secondary | ICD-10-CM

## 2021-03-25 DIAGNOSIS — I10 Essential (primary) hypertension: Secondary | ICD-10-CM

## 2021-03-25 DIAGNOSIS — R109 Unspecified abdominal pain: Secondary | ICD-10-CM | POA: Insufficient documentation

## 2021-03-25 DIAGNOSIS — R300 Dysuria: Secondary | ICD-10-CM

## 2021-03-25 HISTORY — DX: Unspecified abdominal pain: R10.9

## 2021-03-25 LAB — POCT URINALYSIS DIP (MANUAL ENTRY)
Bilirubin, UA: NEGATIVE
Blood, UA: NEGATIVE
Glucose, UA: NEGATIVE mg/dL
Ketones, POC UA: NEGATIVE mg/dL
Leukocytes, UA: NEGATIVE
Nitrite, UA: NEGATIVE
Protein Ur, POC: NEGATIVE mg/dL
Spec Grav, UA: 1.02 (ref 1.010–1.025)
Urobilinogen, UA: 0.2 E.U./dL
pH, UA: 7 (ref 5.0–8.0)

## 2021-03-25 MED ORDER — HYDROCHLOROTHIAZIDE 12.5 MG PO TABS: 12.5000 mg | ORAL_TABLET | Freq: Every day | ORAL | 0 refills | Status: DC

## 2021-03-25 NOTE — Progress Notes (Signed)
    SUBJECTIVE:   CHIEF COMPLAINT / HPI:   ABDOMINAL PAIN  Pain began about a month ago.  Has bilateral upper to mid abdomen discomfort that is made much worse if he sneezes when hurts a great deal for minutes Similar pain before:no  Symptoms Nausea/vomiting: no Diarrhea: no Constipation: no Blood in stool: no Blood in vomit: no Fever: no Dysuria: no Loss of appetite: no Weight loss: yes has been working out a lot and watching his diet.  Does abdomen crunches with weight machine  Patient checked 1 on question 9 on PHQ9 by mistake he has not thoughts of life not worth living  UA noted   PERTINENT  PMH / PSH: Hypertension suspected sleep apnea   OBJECTIVE:   BP 123/80   Ht 6' (1.829 m)   Wt 265 lb 12.8 oz (120.6 kg)   SpO2 99%   BMI 36.05 kg/m   Abdomen: soft without masses, organomegaly or hernias noted.  No guarding or rebound.  Mild pain bilateral edges of rectus that increases dramatically when palpated with his heels raised Heart - Regular rate and rhythm.  No murmurs, gallops or rubs.    Lungs:  Normal respiratory effort, chest expands symmetrically. Lungs are clear to auscultation, no crackles or wheezes.\    ASSESSMENT/PLAN:   Pain in the abdomen Consistent with rectus muscle overuse with intermittent cramps when he sneezes.  No evidence of internal abdomen pathology.  Will cut back on exercise and monitor symptoms   ESSENTIAL HYPERTENSION, BENIGN BP Readings from Last 3 Encounters:  03/25/21 123/80  11/04/20 128/82  10/13/20 110/80   Is at goal.  Will check cmet and refill his HCTZ.     Suggested follow up with PCP for hypertension and asthma - he plans to make an appointment   Carney Living, MD [facility]

## 2021-03-25 NOTE — Assessment & Plan Note (Signed)
Consistent with rectus muscle overuse with intermittent cramps when he sneezes.  No evidence of internal abdomen pathology.  Will cut back on exercise and monitor symptoms

## 2021-03-25 NOTE — Patient Instructions (Signed)
Good to see you today - Thank you for coming in  Things we discussed today:  I think your abdomen pain is due to rectus muscle spasms likely caused by too much working out especially crunches Cut back on the exercise for the stomach muscles and keep hydrated  If not better in 2-3 weeks then come back or if getting worse   I will call you if your tests are not good.  Otherwise, I will send you a message on MyChart (if it is active) or a letter in the mail..  If you do not hear from me with in 2 weeks please call our office.

## 2021-03-25 NOTE — Assessment & Plan Note (Signed)
BP Readings from Last 3 Encounters:  03/25/21 123/80  11/04/20 128/82  10/13/20 110/80   Is at goal.  Will check cmet and refill his HCTZ.

## 2021-03-26 ENCOUNTER — Encounter: Payer: Self-pay | Admitting: Family Medicine

## 2021-03-26 LAB — CMP14+EGFR
ALT: 26 IU/L (ref 0–44)
AST: 24 IU/L (ref 0–40)
Albumin/Globulin Ratio: 1.5 (ref 1.2–2.2)
Albumin: 4.6 g/dL (ref 4.0–5.0)
Alkaline Phosphatase: 87 IU/L (ref 44–121)
BUN/Creatinine Ratio: 10 (ref 9–20)
BUN: 13 mg/dL (ref 6–20)
Bilirubin Total: 0.7 mg/dL (ref 0.0–1.2)
CO2: 24 mmol/L (ref 20–29)
Calcium: 9.9 mg/dL (ref 8.7–10.2)
Chloride: 100 mmol/L (ref 96–106)
Creatinine, Ser: 1.36 mg/dL — ABNORMAL HIGH (ref 0.76–1.27)
Globulin, Total: 3.1 g/dL (ref 1.5–4.5)
Glucose: 90 mg/dL (ref 65–99)
Potassium: 4.2 mmol/L (ref 3.5–5.2)
Sodium: 140 mmol/L (ref 134–144)
Total Protein: 7.7 g/dL (ref 6.0–8.5)
eGFR: 70 mL/min/{1.73_m2} (ref >59)

## 2021-07-29 ENCOUNTER — Ambulatory Visit (INDEPENDENT_AMBULATORY_CARE_PROVIDER_SITE_OTHER): Payer: Self-pay | Admitting: Family Medicine

## 2021-07-29 ENCOUNTER — Other Ambulatory Visit: Payer: Self-pay

## 2021-07-29 ENCOUNTER — Encounter: Payer: Self-pay | Admitting: Family Medicine

## 2021-07-29 VITALS — BP 112/80 | HR 75 | Ht 72.0 in | Wt 268.0 lb

## 2021-07-29 DIAGNOSIS — K6289 Other specified diseases of anus and rectum: Secondary | ICD-10-CM

## 2021-07-29 DIAGNOSIS — I10 Essential (primary) hypertension: Secondary | ICD-10-CM

## 2021-07-29 MED ORDER — HYDROCHLOROTHIAZIDE 12.5 MG PO TABS: 12.5000 mg | ORAL_TABLET | Freq: Every day | ORAL | 0 refills | Status: DC

## 2021-07-29 NOTE — Progress Notes (Addendum)
    SUBJECTIVE:   CHIEF COMPLAINT / HPI:   Bump on right inner buttock and genital area Thinks this has been there for about 1 month.  Has not gotten any bigger.  Unsure about the lesion on scrotal area. Wanting to have  checked. Denies any pain, fevers, weight loss, itching, abdominal pain, dysuria, hematuria, diarrhea. Endorses constipation and history of hemorrhoids but this is not the same.  Does not shave in area.  Sexually active with females and recent STI testing negative.  Reports no sexual encounters since last tested.    PERTINENT  PMH / PSH:  HTN Condyloma   OBJECTIVE:   BP 112/80   Pulse 75   Ht 6' (1.829 m)   Wt 268 lb (121.6 kg)   SpO2 98%   BMI 36.35 kg/m    General: Alert, no acute distress Exam was performed with Chaperone present.  Cleatrice Burke CMA Perineum:  no erythema, edema, drainage appreciated.  Skin intact and smooth appearing.  Pea size soft, nontender and mobile lesion easily palpable noted to right upper perineum at 11 o'clock.   Genital Exam:Testicles are smooth, mobile without lesions and equal in size bilaterally, no pain, no lymphadenopathy appreciated,  Scrotum appear normal without lesions, erythema or edema.  Normal male penis without any lesions or urethral discharge.   ASSESSMENT/PLAN:   Nodule of anus Difficult to visualize.  Considered condyloma, molluscum contagiosum, HSV infection, HS, abscess and  malignancy. Surrounding skin smooth and lesion easy to palpate, soft, notender and mobile.  Appeared to have white head when applied some pressure.   Doubt abscess, HSV infection or HPV infevtion given no pain, fevers, no vesicles or cauliflower appearance.  Likely small epidermoid cyst.  -Discussed with patient that likely not malignant or STI -Collaborative decision made with patient to continue to monitor and if develops any pain, swelling, fevers, discharge,  increase in size or becomes more bothersome to follow up with PCP  -Strict return  precautions provided   ESSENTIAL HYPERTENSION, BENIGN Normotensive today.  Compliant with medications HCTZ 12.5 mg daily  -Refill HCTZ 12.5 mg -BMet today -Follow up with PCP     Dana Allan, MD [facility]

## 2021-07-29 NOTE — Patient Instructions (Addendum)
Thank you for coming to see me today. It was a pleasure.    Increase water intake Increase fruits and vegetables Increase exercise  Warm sitz baths 3-4 times a day.  Keep an eye on the area and if getting any bigger or it starts to hurt please make an appointment with your PCP.  If you have any questions or concerns, please do not hesitate to call the office at [phone].  Dana Allan, MD

## 2021-07-30 ENCOUNTER — Encounter: Payer: Self-pay | Admitting: Family Medicine

## 2021-07-30 DIAGNOSIS — K6289 Other specified diseases of anus and rectum: Secondary | ICD-10-CM

## 2021-07-30 HISTORY — DX: Other specified diseases of anus and rectum: K62.89

## 2021-07-30 NOTE — Assessment & Plan Note (Signed)
Normotensive today.  Compliant with medications HCTZ 12.5 mg daily  -Refill HCTZ 12.5 mg -BMet today -Follow up with PCP

## 2021-07-30 NOTE — Assessment & Plan Note (Signed)
Difficult to visualize.  Considered condyloma, molluscum contagiosum, HSV infection, HS, abscess and  malignancy. Surrounding skin smooth and lesion easy to palpate, soft, notender and mobile.  Appeared to have white head when applied some pressure.   Doubt abscess, HSV infection or HPV infevtion given no pain, fevers, no vesicles or cauliflower appearance.  Likely small epidermoid cyst.  -Discussed with patient that likely not malignant or STI -Collaborative decision made with patient to continue to monitor and if develops any pain, swelling, fevers, discharge,  increase in size or becomes more bothersome to follow up with PCP  -Strict return precautions provided

## 2021-08-14 ENCOUNTER — Other Ambulatory Visit: Payer: Self-pay

## 2021-08-14 DIAGNOSIS — J4520 Mild intermittent asthma, uncomplicated: Secondary | ICD-10-CM

## 2021-08-18 MED ORDER — FLUTICASONE FUROATE-VILANTEROL 200-25 MCG/INH IN AEPB: 1.0000 | INHALATION_SPRAY | Freq: Every day | RESPIRATORY_TRACT | 5 refills | Status: DC

## 2021-09-08 ENCOUNTER — Other Ambulatory Visit: Payer: Self-pay

## 2021-09-08 ENCOUNTER — Ambulatory Visit
Admission: EM | Admit: 2021-09-08 | Discharge: 2021-09-08 | Disposition: A | Discharge status: Home / Self Care | Payer: 59 | Attending: Emergency Medicine | Admitting: Emergency Medicine

## 2021-09-08 DIAGNOSIS — J029 Acute pharyngitis, unspecified: Secondary | ICD-10-CM | POA: Insufficient documentation

## 2021-09-08 LAB — POCT RAPID STREP A (OFFICE): Rapid Strep A Screen: NEGATIVE

## 2021-09-08 MED ORDER — CETIRIZINE HCL 10 MG PO CAPS: 10.0000 mg | ORAL_CAPSULE | Freq: Every day | ORAL | 0 refills | Status: DC

## 2021-09-08 MED ORDER — IBUPROFEN 800 MG PO TABS: 800.0000 mg | ORAL_TABLET | Freq: Three times a day (TID) | ORAL | 0 refills | Status: DC

## 2021-09-08 NOTE — ED Triage Notes (Signed)
Pt  reports having sore throat for about a day.  Patient has not tried any home interventions.

## 2021-09-08 NOTE — Discharge Instructions (Signed)
Sore Throat  Your rapid strep tested Negative today.  COVID is pending, sore throat likely viral and related to postnasal drainage  Please continue Tylenol or Ibuprofen for fever and pain. May try salt water gargles, cepacol lozenges, throat spray, or OTC cold relief medicine for throat discomfort. If you also have congestion take a daily anti-histamine like Zyrtec, Claritin, and a oral decongestant to help with post nasal drip that may be irritating your throat.   Stay hydrated and drink plenty of fluids to keep your throat coated relieve irritation.

## 2021-09-08 NOTE — ED Provider Notes (Signed)
[facility]    CSN: [id number] Arrival date & time: 09/08/21  0950      History   Chief Complaint Chief Complaint  Patient presents with   Sore Throat    HPI Scott Patterson is a 35 y.o. male presenting today for evaluation of the sore throat.  Sore throat, postnasal drainage and cough x1 day.  Daughter also with sore throat.  Reports possible strep exposure.  Denies fevers.  HPI  Past Medical History:  Diagnosis Date   Anxiety    Asthma    Depression    Genital herpes 04/02/2011   Hypertension     Patient Active Problem List   Diagnosis Date Noted   Nodule of anus 07/30/2021   Pain in the abdomen 03/25/2021   Suspected sleep apnea 11/04/2020   Inadequate sleep hygiene 11/04/2020   Generalized headaches 09/20/2020   Encounter for immunization 09/20/2020   Fatigue 05/08/2020   Condyloma 05/08/2020   Anxiety 02/06/2018   Depression 01/11/2014   ESSENTIAL HYPERTENSION, BENIGN 12/11/2010   OBESITY, NOS 02/09/2007   Allergic rhinitis 02/09/2007   Asthma, mild intermittent 02/09/2007    Past Surgical History:  Procedure Laterality Date   None         Home Medications    Prior to Admission medications   Medication Sig Start Date End Date Taking? Authorizing Provider  Cetirizine HCl 10 MG CAPS Take 1 capsule (10 mg total) by mouth daily for 10 days. 09/08/21 09/18/21 Yes Naman Spychalski C, PA-C  ibuprofen (ADVIL) 800 MG tablet Take 1 tablet (800 mg total) by mouth 3 (three) times daily. 09/08/21  Yes Trew Sunde C, PA-C  albuterol (VENTOLIN HFA) 108 (90 Base) MCG/ACT inhaler Inhale 1-2 puffs into the lungs every 6 (six) hours as needed for wheezing or shortness of breath. 10/13/20   Lilland, Alana, DO  fluticasone furoate-vilanterol (BREO ELLIPTA) 200-25 MCG/INH AEPB Inhale 1 puff into the lungs daily. 08/18/21   Lilland, Alana, DO  hydrochlorothiazide (HYDRODIURIL) 12.5 MG tablet Take 1 tablet (12.5 mg total) by mouth daily. 07/29/21   Dana Allan, MD   Spacer/Aero-Hold Chamber Bags MISC Use with inhaler 03/22/19   Westley Chandler, MD    Family History Family History  Problem Relation Age of Onset   Depression Mother    Hypertension Mother    Allergic rhinitis Mother    Alcohol abuse Father    Drug abuse Father    Stroke Father    Hypertension Father    Allergic rhinitis Father    Alcohol abuse Paternal Uncle    Hypertension Brother    Angioedema Neg Hx    Asthma Neg Hx    Atopy Neg Hx    Eczema Neg Hx    Immunodeficiency Neg Hx    Urticaria Neg Hx     Social History Social History   Tobacco Use   Smoking status: Never   Smokeless tobacco: Never  Vaping Use   Vaping Use: Never used  Substance Use Topics   Alcohol use: No   Drug use: No     Allergies   Patient has no known allergies.   Review of Systems Review of Systems  Constitutional:  Negative for activity change, appetite change, chills, fatigue and fever.  HENT:  Positive for congestion and sore throat. Negative for ear pain, rhinorrhea, sinus pressure and trouble swallowing.   Eyes:  Negative for discharge and redness.  Respiratory:  Positive for cough. Negative for chest tightness and shortness of breath.  Cardiovascular:  Negative for chest pain.  Gastrointestinal:  Negative for abdominal pain, diarrhea, nausea and vomiting.  Musculoskeletal:  Negative for myalgias.  Skin:  Negative for rash.  Neurological:  Negative for dizziness, light-headedness and headaches.    Physical Exam Triage Vital Signs ED Triage Vitals  Enc Vitals Group     BP      Pulse      Resp      Temp      Temp src      SpO2      Weight      Height      Head Circumference      Peak Flow      Pain Score      Pain Loc      Pain Edu?      Excl. in GC?    No data found.  Updated Vital Signs BP 140/89 (BP Location: Right Arm)   Pulse 79   Temp 98.4 F (36.9 C) (Oral)   Resp 18   SpO2 97%   Visual Acuity Right Eye Distance:   Left Eye Distance:   Bilateral  Distance:    Right Eye Near:   Left Eye Near:    Bilateral Near:     Physical Exam Vitals and nursing note reviewed.  Constitutional:      Appearance: He is well-developed.     Comments: No acute distress  HENT:     Head: Normocephalic and atraumatic.     Ears:     Comments: Bilateral ears without tenderness to palpation of external auricle, tragus and mastoid, EAC's without erythema or swelling, TM's with good bony landmarks and cone of light. Non erythematous.      Nose: Nose normal.     Mouth/Throat:     Comments: Oral mucosa pink and moist, no tonsillar enlargement or exudate. Posterior pharynx patent and nonerythematous, no uvula deviation or swelling. Normal phonation.  Eyes:     Conjunctiva/sclera: Conjunctivae normal.  Cardiovascular:     Rate and Rhythm: Normal rate and regular rhythm.  Pulmonary:     Effort: Pulmonary effort is normal. No respiratory distress.     Comments: Breathing comfortably at rest, CTABL, no wheezing, rales or other adventitious sounds auscultated  Abdominal:     General: There is no distension.  Musculoskeletal:        General: Normal range of motion.     Cervical back: Neck supple.  Skin:    General: Skin is warm and dry.  Neurological:     Mental Status: He is alert and oriented to person, place, and time.     UC Treatments / Results  Labs (all labs ordered are listed, but only abnormal results are displayed) Labs Reviewed  CULTURE, GROUP A STREP ([facility])  NOVEL CORONAVIRUS, NAA  POCT RAPID STREP A (OFFICE)    EKG   Radiology No results found.  Procedures Procedures (including critical care time)  Medications Ordered in UC Medications - No data to display  Initial Impression / Assessment and Plan / UC Course  I have reviewed the triage vital signs and the nursing notes.  Pertinent labs & imaging results that were available during my care of the patient were reviewed by me and considered in my medical decision making  (see chart for details).     Viral URI with cough-strep negative, Covid test pending, exam reassuring, lungs clear to auscultation, suspect viral etiology and recommend symptomatic and supportive care at this time.  Recommendations  provided.  Rest and fluids.  Continue to monitor.  Final Clinical Impressions(s) / UC Diagnoses   Final diagnoses:  Sore throat     Discharge Instructions      Sore Throat  Your rapid strep tested Negative today.  COVID is pending, sore throat likely viral and related to postnasal drainage  Please continue Tylenol or Ibuprofen for fever and pain. May try salt water gargles, cepacol lozenges, throat spray, or OTC cold relief medicine for throat discomfort. If you also have congestion take a daily anti-histamine like Zyrtec, Claritin, and a oral decongestant to help with post nasal drip that may be irritating your throat.   Stay hydrated and drink plenty of fluids to keep your throat coated relieve irritation.       ED Prescriptions     Medication Sig Dispense Auth. Provider   ibuprofen (ADVIL) 800 MG tablet Take 1 tablet (800 mg total) by mouth 3 (three) times daily. 21 tablet Jaimya Feliciano C, PA-C   Cetirizine HCl 10 MG CAPS Take 1 capsule (10 mg total) by mouth daily for 10 days. 10 capsule Elva Breaker, Scott C, PA-C      PDMP not reviewed this encounter.   Lew Dawes, [place] 09/08/21 1115

## 2021-09-09 LAB — SARS-COV-2, NAA 2 DAY TAT

## 2021-09-09 LAB — NOVEL CORONAVIRUS, NAA: SARS-CoV-2, NAA: NOT DETECTED

## 2021-09-11 LAB — CULTURE, GROUP A STREP (THRC)

## 2021-09-12 ENCOUNTER — Encounter (HOSPITAL_BASED_OUTPATIENT_CLINIC_OR_DEPARTMENT_OTHER): Payer: Self-pay | Admitting: Emergency Medicine

## 2021-09-12 ENCOUNTER — Other Ambulatory Visit: Payer: Self-pay

## 2021-09-12 ENCOUNTER — Emergency Department (HOSPITAL_BASED_OUTPATIENT_CLINIC_OR_DEPARTMENT_OTHER)
Admission: EM | Admit: 2021-09-12 | Discharge: 2021-09-12 | Disposition: A | Discharge status: Home / Self Care | Payer: 59 | Attending: Emergency Medicine | Admitting: Emergency Medicine

## 2021-09-12 ENCOUNTER — Emergency Department (HOSPITAL_BASED_OUTPATIENT_CLINIC_OR_DEPARTMENT_OTHER): Payer: 59

## 2021-09-12 DIAGNOSIS — I10 Essential (primary) hypertension: Secondary | ICD-10-CM | POA: Insufficient documentation

## 2021-09-12 DIAGNOSIS — J4520 Mild intermittent asthma, uncomplicated: Secondary | ICD-10-CM | POA: Insufficient documentation

## 2021-09-12 DIAGNOSIS — Z7951 Long term (current) use of inhaled steroids: Secondary | ICD-10-CM | POA: Insufficient documentation

## 2021-09-12 DIAGNOSIS — R000 Tachycardia, unspecified: Secondary | ICD-10-CM | POA: Insufficient documentation

## 2021-09-12 DIAGNOSIS — Z79899 Other long term (current) drug therapy: Secondary | ICD-10-CM | POA: Diagnosis not present

## 2021-09-12 DIAGNOSIS — R0789 Other chest pain: Secondary | ICD-10-CM | POA: Insufficient documentation

## 2021-09-12 DIAGNOSIS — R7989 Other specified abnormal findings of blood chemistry: Secondary | ICD-10-CM

## 2021-09-12 LAB — COMPREHENSIVE METABOLIC PANEL
ALT: 25 U/L (ref 0–44)
AST: 25 U/L (ref 15–41)
Albumin: 4.4 g/dL (ref 3.5–5.0)
Alkaline Phosphatase: 71 U/L (ref 38–126)
Anion gap: 8 (ref 5–15)
BUN: 15 mg/dL (ref 6–20)
CO2: 28 mmol/L (ref 22–32)
Calcium: 9.5 mg/dL (ref 8.9–10.3)
Chloride: 100 mmol/L (ref 98–111)
Creatinine, Ser: 1.42 mg/dL — ABNORMAL HIGH (ref 0.61–1.24)
GFR, Estimated: >60 mL/min (ref >60)
Glucose, Bld: 84 mg/dL (ref 70–99)
Potassium: 4.1 mmol/L (ref 3.5–5.1)
Sodium: 136 mmol/L (ref 135–145)
Total Bilirubin: 0.9 mg/dL (ref 0.3–1.2)
Total Protein: 8.4 g/dL — ABNORMAL HIGH (ref 6.5–8.1)

## 2021-09-12 LAB — CBC WITH DIFFERENTIAL/PLATELET
Abs Immature Granulocytes: 0.03 10*3/uL (ref 0.00–0.07)
Basophils Absolute: 0 10*3/uL (ref 0.0–0.1)
Basophils Relative: 0 %
Eosinophils Absolute: 0.1 10*3/uL (ref 0.0–0.5)
Eosinophils Relative: 1 %
HCT: 45.5 % (ref 39.0–52.0)
Hemoglobin: 14.8 g/dL (ref 13.0–17.0)
Immature Granulocytes: 1 %
Lymphocytes Relative: 23 %
Lymphs Abs: 1.4 10*3/uL (ref 0.7–4.0)
MCH: 26 pg (ref 26.0–34.0)
MCHC: 32.5 g/dL (ref 30.0–36.0)
MCV: 79.8 fL — ABNORMAL LOW (ref 80.0–100.0)
Monocytes Absolute: 0.6 10*3/uL (ref 0.1–1.0)
Monocytes Relative: 10 %
Neutro Abs: 3.9 10*3/uL (ref 1.7–7.7)
Neutrophils Relative %: 65 %
Platelets: 183 10*3/uL (ref 150–400)
RBC: 5.7 MIL/uL (ref 4.22–5.81)
RDW: 15.4 % (ref 11.5–15.5)
WBC: 6 10*3/uL (ref 4.0–10.5)
nRBC: 0 % (ref 0.0–0.2)

## 2021-09-12 LAB — TROPONIN I (HIGH SENSITIVITY): Troponin I (High Sensitivity): 3 ng/L (ref <18)

## 2021-09-12 NOTE — Discharge Instructions (Addendum)
Your work-up for chest pain was very reassuring, I suspect the cause is likely more muscle than cardiac in nature.  During your work-up as we discussed your creatinine level was elevated.  This is a marker of renal injury.  I reviewed your chart and your creatinine levels have been elevated the last 2 years, this is something that needs to be followed up on.  Please schedule an appointment with your primary care doctor for this week or next week, I am also giving information for primary care doctor in case you do not currently have one.  13   Today 5 mo ago 1 yrs 1 yrs 2 yrs 2 yrs  Creatinine, Ser  1.42 High   1.36 High  R  1.34 High  R  1.35 High  R  1.43 High   1.34 High

## 2021-09-12 NOTE — ED Provider Notes (Signed)
[facility] EMERGENCY DEPARTMENT Provider Note   CSN: [id number] Arrival date & time: 09/12/21  1440     History Chief Complaint  Patient presents with   Chest Pain    Scott Patterson is a 35 y.o. male.  HPI  Patient presents with chest pain x2 days.  Pain is constant.  It does not radiate anywhere, it feels sharp.  He denies any feelings of associated nausea, vomiting, shortness of breath, leg swelling.  Nothing makes the pain worse other than inspiration, he has not tried any alleviating factors.  Has never had pain like this before, no history of MIs, blood clots, strokes.  He does take medicine for hypertension, denies any history of diabetes, does not smoke any cigarettes.  Past Medical History:  Diagnosis Date   Anxiety    Asthma    Depression    Genital herpes 04/02/2011   Hypertension     Patient Active Problem List   Diagnosis Date Noted   Nodule of anus 07/30/2021   Pain in the abdomen 03/25/2021   Suspected sleep apnea 11/04/2020   Inadequate sleep hygiene 11/04/2020   Generalized headaches 09/20/2020   Encounter for immunization 09/20/2020   Fatigue 05/08/2020   Condyloma 05/08/2020   Anxiety 02/06/2018   Depression 01/11/2014   ESSENTIAL HYPERTENSION, BENIGN 12/11/2010   OBESITY, NOS 02/09/2007   Allergic rhinitis 02/09/2007   Asthma, mild intermittent 02/09/2007    Past Surgical History:  Procedure Laterality Date   None         Family History  Problem Relation Age of Onset   Depression Mother    Hypertension Mother    Allergic rhinitis Mother    Alcohol abuse Father    Drug abuse Father    Stroke Father    Hypertension Father    Allergic rhinitis Father    Alcohol abuse Paternal Uncle    Hypertension Brother    Angioedema Neg Hx    Asthma Neg Hx    Atopy Neg Hx    Eczema Neg Hx    Immunodeficiency Neg Hx    Urticaria Neg Hx     Social History   Tobacco Use   Smoking status: Never   Smokeless tobacco: Never  Vaping  Use   Vaping Use: Never used  Substance Use Topics   Alcohol use: No   Drug use: No    Home Medications Prior to Admission medications   Medication Sig Start Date End Date Taking? Authorizing Provider  albuterol (VENTOLIN HFA) 108 (90 Base) MCG/ACT inhaler Inhale 1-2 puffs into the lungs every 6 (six) hours as needed for wheezing or shortness of breath. 10/13/20   Lilland, Alana, DO  Cetirizine HCl 10 MG CAPS Take 1 capsule (10 mg total) by mouth daily for 10 days. 09/08/21 09/18/21  Wieters, Hallie C, PA-C  fluticasone furoate-vilanterol (BREO ELLIPTA) 200-25 MCG/INH AEPB Inhale 1 puff into the lungs daily. 08/18/21   Lilland, Alana, DO  hydrochlorothiazide (HYDRODIURIL) 12.5 MG tablet Take 1 tablet (12.5 mg total) by mouth daily. 07/29/21   Dana Allan, MD  ibuprofen (ADVIL) 800 MG tablet Take 1 tablet (800 mg total) by mouth 3 (three) times daily. 09/08/21   Wieters, Junius Creamer, PA-C  Spacer/Aero-Hold Chamber Bags MISC Use with inhaler 03/22/19   Westley Chandler, MD    Allergies    Patient has no known allergies.  Review of Systems   Review of Systems  Constitutional:  Negative for chills and fever.  HENT:  Negative  for ear pain and sore throat.   Eyes:  Negative for pain and visual disturbance.  Respiratory:  Negative for cough and shortness of breath.   Cardiovascular:  Positive for chest pain. Negative for palpitations and leg swelling.  Gastrointestinal:  Negative for abdominal pain, nausea and vomiting.  Genitourinary:  Negative for dysuria and hematuria.  Musculoskeletal:  Negative for arthralgias and back pain.  Skin:  Negative for color change and rash.  Neurological:  Negative for seizures and syncope.  All other systems reviewed and are negative.  Physical Exam Updated Vital Signs BP 130/90   Pulse 80   Temp 98.5 F (36.9 C) (Oral)   Resp 15   Ht 6\' 2"  (1.88 m)   Wt 120.2 kg   SpO2 100%   BMI 34.02 kg/m   Physical Exam Vitals and nursing note reviewed. Exam  conducted with a chaperone present.  Constitutional:      Appearance: Normal appearance. He is obese.  HENT:     Head: Normocephalic and atraumatic.  Eyes:     General: No scleral icterus.       Right eye: No discharge.        Left eye: No discharge.     Extraocular Movements: Extraocular movements intact.     Pupils: Pupils are equal, round, and reactive to light.  Cardiovascular:     Rate and Rhythm: Normal rate and regular rhythm.     Pulses: Normal pulses.     Heart sounds: Normal heart sounds. No murmur heard.   No friction rub. No gallop.     Comments: Radial pulses 2+ bilaterally Pulmonary:     Effort: Pulmonary effort is normal. No respiratory distress.     Breath sounds: Normal breath sounds.  Chest:     Chest wall: Tenderness present.     Comments: Reproducible chest wall tenderness.  S1-S2 without any murmurs, rubs, gallops Abdominal:     General: Abdomen is flat. Bowel sounds are normal. There is no distension.     Palpations: Abdomen is soft.     Tenderness: There is no abdominal tenderness.  Musculoskeletal:     Comments: Legs are roughly symmetric, no edema  Skin:    General: Skin is warm and dry.     Coloration: Skin is not jaundiced.  Neurological:     Mental Status: He is alert. Mental status is at baseline.     Coordination: Coordination normal.    ED Results / Procedures / Treatments   Labs (all labs ordered are listed, but only abnormal results are displayed) Labs Reviewed - No data to display  EKG None  Radiology No results found.  Procedures Procedures   Medications Ordered in ED Medications - No data to display  ED Course  I have reviewed the triage vital signs and the nursing notes.  Pertinent labs & imaging results that were available during my care of the patient were reviewed by me and considered in my medical decision making (see chart for details).    MDM Rules/Calculators/A&P                           Patient vitals are  stable, he is not in any acute distress.  Patient is PERC negative, no tachycardia or hypoxia.  Doubt PE.  Chest pain is been constant, reproducible making musculoskeletal.  Possible.  Minimal risk factors for cardiovascular disease other than obesity and hypertension.  Heart score is 1.  Chest  pain has been constant for 2 days, negative initial Trope.  EKG is without any ST elevation or ST depression side be concerning for ACS.  No tachycardia, no hypoxia.  His vitals are stable, pain is also reproducible on physical exam.  I suspect his pain is more likely musculoskeletal, he also has minimal risk factors other than his weight and hypertension.  Patient does have elevated creatinine, per chart review this is typically his baseline.  Discussed this with the patient, he does not have any known history of renal impairment.  States that kidney problems run in his family, denies any diabetes or medication changes.  Although this is not emergent, will need to be worked up on the outpatient setting.  At this time he is stable and appropriate for discharge.  Final Clinical Impression(s) / ED Diagnoses Final diagnoses:  None    Rx / DC Orders ED Discharge Orders     None        Theron Arista, PA-C 09/12/21 1628    Rolan Bucco, MD 09/12/21 1920

## 2021-09-12 NOTE — ED Triage Notes (Signed)
Pt reports upper chest tightness x 2days; sts pain is constant, feels like it's hard to breathe at times

## 2021-09-21 ENCOUNTER — Other Ambulatory Visit: Payer: Self-pay

## 2021-09-21 ENCOUNTER — Ambulatory Visit (INDEPENDENT_AMBULATORY_CARE_PROVIDER_SITE_OTHER): Payer: 59 | Admitting: Family Medicine

## 2021-09-21 VITALS — BP 135/94 | HR 77 | Wt 271.8 lb

## 2021-09-21 DIAGNOSIS — I10 Essential (primary) hypertension: Secondary | ICD-10-CM

## 2021-09-21 DIAGNOSIS — R7989 Other specified abnormal findings of blood chemistry: Secondary | ICD-10-CM | POA: Diagnosis not present

## 2021-09-21 MED ORDER — AMLODIPINE BESYLATE 10 MG PO TABS: 10.0000 mg | ORAL_TABLET | Freq: Every day | ORAL | 1 refills | Status: DC

## 2021-09-21 NOTE — Patient Instructions (Signed)
Thank you for coming in today. We discussed your elevated blood pressure.  The plan is to discontinue hydrochlorothiazide and start amlodipine 10 mg daily.  We will follow-up in 2 weeks for a blood pressure recheck as well as labs to check your kidney function.  Dr. Salvadore Dom

## 2021-09-21 NOTE — Progress Notes (Signed)
    SUBJECTIVE:   CHIEF COMPLAINT / HPI:   Scott Patterson is a 35 year old male who presents for follow-up.  Seen in ED 10/1 for chest pain rule out.  He states that they said it was chest wall pain as he works out and lifts a lot of weights.  Today he continues to endorse headache and concern over kidney function.  He does not check his blood pressure at home.  States in the past he was on a different medication for his blood pressure but cannot remember what it was.  Prior to the ED visit he had no knowledge that his kidney function was elevated. Denies back pain, suprapubic pain, and dysuria.   PERTINENT  PMH / PSH: HTN, Depression/Anxiety, Hx of elevated Cr   OBJECTIVE:   BP (!) 135/94   Pulse 77   Wt 271 lb 12.8 oz (123.3 kg)   SpO2 100%   BMI 34.90 kg/m   Physical Exam Vitals reviewed.  Constitutional:      General: He is not in acute distress. Cardiovascular:     Rate and Rhythm: Normal rate and regular rhythm.     Heart sounds: Normal heart sounds.  Pulmonary:     Effort: Pulmonary effort is normal.     Breath sounds: Normal breath sounds.  Musculoskeletal:     Comments: No CVA tenderness  Neurological:     Mental Status: He is alert and oriented to person, place, and time.   ASSESSMENT/PLAN:   1. Essential hypertension, benign Elevated today. Asymptomatic. Taking HCTZ 12.5 mg daily. Plan to discontinue with patient elevated creatinine. Start below medication. - amLODipine (NORVASC) 10 MG tablet; Take 1 tablet (10 mg total) by mouth at bedtime.  Dispense: 30 tablet; Refill: 1 -Follow up in 2 weeks for BP check -Obtain BMP  2. Elevated serum creatinine 4 year history of elevated creatinine with normal GFR. Consider uncontrolled htn as etiology. No CVA tenderness or dysuria. Will discontinue HCTZ and monitor for improvement.  -BMP at follow up  Lavonda Jumbo, DO [facility]

## 2021-09-24 ENCOUNTER — Other Ambulatory Visit: Payer: Self-pay

## 2021-09-24 ENCOUNTER — Ambulatory Visit (INDEPENDENT_AMBULATORY_CARE_PROVIDER_SITE_OTHER): Payer: 59 | Admitting: Family Medicine

## 2021-09-24 DIAGNOSIS — I10 Essential (primary) hypertension: Secondary | ICD-10-CM | POA: Diagnosis not present

## 2021-09-24 MED ORDER — AMLODIPINE BESYLATE 10 MG PO TABS: 10.0000 mg | ORAL_TABLET | Freq: Every day | ORAL | 2 refills | Status: DC

## 2021-09-24 NOTE — Progress Notes (Signed)
    SUBJECTIVE:   CHIEF COMPLAINT / HPI:   Hypertension follow-up: 35 year old male presenting for hypertension follow-up.  Was most recently seen on 10/10 with blood pressure 135/94 at that time.  His hydrochlorothiazide was discontinued and was started on amlodipine due to his AKI.  Was previously seen on 8/18 and was normotensive at the time while on hydrochlorothiazide.  Today he states he was started on amlodipine 3 days ago but since then has been having ongoing headaches which is not normal for him and he believes this to be an adverse effects of the medication.  He is interested in trying a different medication.  Most recent creatinine of 1.42 with estimated GFR greater than 60, prior to this his baseline seem to be around 1.3.  Patient denies having a headache at this time.  PERTINENT  PMH / PSH: None relevant  OBJECTIVE:   BP 115/84   Pulse 71   Ht 6\' 2"  (1.88 m)   Wt 272 lb 12.8 oz (123.7 kg)   SpO2 100%   BMI 35.03 kg/m    General: NAD, pleasant, able to participate in exam Respiratory: No respiratory distress Skin: warm and dry, no rashes noted Neuro: alert, no obvious focal deficits, CN II through XII intact, fine touch sensation intact in upper lower extremities bilaterally, strength 5/5 in upper and lower extremities bilaterally Psych: Normal affect and mood  ASSESSMENT/PLAN:   ESSENTIAL HYPERTENSION, BENIGN Blood pressure excellent today at 115/84.  Patient started amlodipine 3 days ago and is endorsed some headaches since discontinuing the hydrochlorothiazide.  On further questioning he is unsure if he is drinking as much water as before.  His creatinine of 1.4 did not necessarily provide the need to discontinue the hydrochlorothiazide so this is still an option to return to.  I discussed with the patient options as far as continuing amlodipine versus returning to hydrochlorothiazide and at this time because his blood pressure is so well controlled he would like to  continue on the amlodipine.  I discussed with him that if he is still getting headaches more commonly with it and feels this may be an adverse effect to the amlodipine we can always return to the hydrochlorothiazide and that I can even Caldesene without him having to return Monday or Tuesday of next week.  I discussed his case with Dr. Wednesday who agrees.  We will send in refill for amlodipine per patient's request so he has plenty of it.    Leveda Anna, DO [facility]

## 2021-09-24 NOTE — Patient Instructions (Signed)
Your blood pressure today is excellent.  As we discussed we can continue the amlodipine but if it continues to cause headaches we can return to hydrochlorothiazide or another medication.  The headaches are likely due to water intake and I recommend you trying to drink plenty of water after stopping the hydrochlorothiazide which is a diuretic.  I have sent in refill for additional amlodipine so we have plenty of it.  I would like for you to come back in about a month or 2 just to make sure your blood pressures are still doing all right.

## 2021-09-24 NOTE — Assessment & Plan Note (Signed)
Blood pressure excellent today at 115/84.  Patient started amlodipine 3 days ago and is endorsed some headaches since discontinuing the hydrochlorothiazide.  On further questioning he is unsure if he is drinking as much water as before.  His creatinine of 1.4 did not necessarily provide the need to discontinue the hydrochlorothiazide so this is still an option to return to.  I discussed with the patient options as far as continuing amlodipine versus returning to hydrochlorothiazide and at this time because his blood pressure is so well controlled he would like to continue on the amlodipine.  I discussed with him that if he is still getting headaches more commonly with it and feels this may be an adverse effect to the amlodipine we can always return to the hydrochlorothiazide and that I can even Caldesene without him having to return Monday or Tuesday of next week.  I discussed his case with Dr. Leveda Anna who agrees.  We will send in refill for amlodipine per patient's request so he has plenty of it.

## 2021-10-13 ENCOUNTER — Other Ambulatory Visit: Payer: Self-pay

## 2021-10-13 ENCOUNTER — Encounter: Payer: Self-pay | Admitting: Family Medicine

## 2021-10-13 ENCOUNTER — Ambulatory Visit (INDEPENDENT_AMBULATORY_CARE_PROVIDER_SITE_OTHER): Payer: 59 | Admitting: Family Medicine

## 2021-10-13 VITALS — BP 128/80 | HR 82 | Ht 74.0 in | Wt 271.0 lb

## 2021-10-13 DIAGNOSIS — R519 Headache, unspecified: Secondary | ICD-10-CM | POA: Diagnosis not present

## 2021-10-13 DIAGNOSIS — I10 Essential (primary) hypertension: Secondary | ICD-10-CM

## 2021-10-13 DIAGNOSIS — R7989 Other specified abnormal findings of blood chemistry: Secondary | ICD-10-CM | POA: Diagnosis not present

## 2021-10-13 NOTE — Assessment & Plan Note (Signed)
BP today 128/80 (goal <130/90).  Patient has been continued on amlodipine, does note he has still had headaches but is not sure that it is the medication contributing to it.  Patient does have elevated creatinine at baseline that been present since 2019, baseline appears to be around 1.3-1.4.  Encourage patient to get blood pressure cuff and educated on proper way to take blood pressure measurements at home. - Keep blood pressure log - Follow-up in 3 months

## 2021-10-13 NOTE — Progress Notes (Signed)
    SUBJECTIVE:   CHIEF COMPLAINT / HPI:   HTN  Patient reports that he has not gotten a blood pressure cuff to measure his blood pressure at home, but that he is going to get 1.  He has been doing well on his amlodipine and has been compliant.  He is wanting to make sure that he is protecting his kidneys as he was told this may be a concern given his elevated labs. He is worried about his elevated creatinine.  Headaches Still having headaches, is unsure of what is causing the headaches.  He does not identify a trigger and is really only noticed it more frequently since he started his amlodipine.  He had previously decided to continue the amlodipine despite the headaches.    PERTINENT  PMH / PSH: Reviewed  OBJECTIVE:   BP 128/80   Pulse 82   Ht 6\' 2"  (1.88 m)   Wt 271 lb (122.9 kg)   SpO2 98%   BMI 34.79 kg/m   General: NAD, well-appearing, well-nourished Respiratory: No respiratory distress, breathing comfortably, able to speak in full sentences Skin: warm and dry, no rashes noted on exposed skin Psych: Appropriate affect and mood  ASSESSMENT/PLAN:   ESSENTIAL HYPERTENSION, BENIGN BP today 128/80 (goal <130/90).  Patient has been continued on amlodipine, does note he has still had headaches but is not sure that it is the medication contributing to it.  Patient does have elevated creatinine at baseline that been present since 2019, baseline appears to be around 1.3-1.4.  Encourage patient to get blood pressure cuff and educated on proper way to take blood pressure measurements at home. - Keep blood pressure log - Follow-up in 3 months  Generalized headaches Patient continues to have headaches, did note increased when switching from HCTZ to amlodipine in the last month.  Discussed keeping headache journal as well as taking blood pressure during headaches.  Patient does have suspected sleep apnea and has not followed up on getting a sleep study-could be contributing to current  complaints as well.   Elevated Creatinine  Patient serum creatinine has been persistently elevated since 2019.  Most recent measurements on 09/12/2021 was 1.42, patient's baseline appears to range between 1.3 and 1.4.  Patient's GFR on last check was >60.  Feel this is likely elevated secondary to chronic hypertension that was untreated.  We will continue to follow with checks every 6-12 months.  11/12/2021, DO [facility]

## 2021-10-13 NOTE — Patient Instructions (Signed)
The main thing that is going to protect your kidneys is making sure that we keep your blood pressure under control. The medication you are on right now is a good one to help with that.  I do want you to get a blood pressure cuff so they can check it at home.  You will need to be in a cool quiet area without much movement or talking for several minutes before you check your blood pressure.  Your blood pressure goal is <130/80.   I also want you to keep a headache diary so we can keep track of that may be triggering her headaches and make sure it is not related to the medication or your blood pressure.  I also recommend that you check your blood pressure when you are having headaches.  Please follow-up in the next 2 to 3 months with your blood pressure log, if you find your blood pressures are consistently elevated above your goal then we can come in sooner and we can adjust your medications.

## 2021-10-13 NOTE — Assessment & Plan Note (Signed)
Patient continues to have headaches, did note increased when switching from HCTZ to amlodipine in the last month.  Discussed keeping headache journal as well as taking blood pressure during headaches.  Patient does have suspected sleep apnea and has not followed up on getting a sleep study-could be contributing to current complaints as well.

## 2021-10-26 ENCOUNTER — Telehealth: Payer: Self-pay

## 2021-10-26 ENCOUNTER — Other Ambulatory Visit: Payer: Self-pay

## 2021-10-26 ENCOUNTER — Ambulatory Visit
Admission: EM | Admit: 2021-10-26 | Discharge: 2021-10-26 | Disposition: A | Discharge status: Home / Self Care | Payer: 59 | Attending: Emergency Medicine | Admitting: Emergency Medicine

## 2021-10-26 ENCOUNTER — Encounter: Payer: Self-pay | Admitting: Emergency Medicine

## 2021-10-26 DIAGNOSIS — R519 Headache, unspecified: Secondary | ICD-10-CM | POA: Diagnosis not present

## 2021-10-26 DIAGNOSIS — I159 Secondary hypertension, unspecified: Secondary | ICD-10-CM | POA: Diagnosis not present

## 2021-10-26 DIAGNOSIS — I10 Essential (primary) hypertension: Secondary | ICD-10-CM | POA: Diagnosis not present

## 2021-10-26 MED ORDER — AMLODIPINE BESYLATE 5 MG PO TABS: 5.0000 mg | ORAL_TABLET | Freq: Every day | ORAL | 2 refills | Status: DC

## 2021-10-26 MED ORDER — HYDROCHLOROTHIAZIDE 12.5 MG PO TABS: 12.5000 mg | ORAL_TABLET | Freq: Every day | ORAL | 2 refills | Status: DC

## 2021-10-26 NOTE — Progress Notes (Deleted)
    SUBJECTIVE:   CHIEF COMPLAINT / HPI:   HTN-   Headaches-   PERTINENT  PMH / PSH: ***  OBJECTIVE:   There were no vitals taken for this visit.  ***  ASSESSMENT/PLAN:   No problem-specific Assessment & Plan notes found for this encounter.     Billey Co, MD [facility]

## 2021-10-26 NOTE — ED Provider Notes (Signed)
[facility]    CSN: [id number] Arrival date & time: 10/26/21  [id number]   History   Chief Complaint Chief Complaint  Patient presents with   Hypertension   HPI Scott Patterson is a 35 y.o. male. Pt presents with high blood pressure. States reading this am was 170/91. States having constant headaches. Unable to get in with PCP today.  Patient states he was recently seen by his primary care provider but did not feel that he was having good feedback from them, states that he is repeatedly request to have a plan in place but does not ever seem to get a straight answer.  Patient states he was initially prescribed hydrochlorothiazide and that he was found to not have good blood pressure control, the hydrochlorothiazide was discontinued and he was started on amlodipine 10 mg instead.  Patient states that he does not feel that this is helping and since he stopped taking the hydrochlorothiazide he has began to have persistent headaches.  Patient states has been taking ibuprofen or the headaches which provide some relief, states he wakes up in the morning with a headache nearly every day.  Patient states the headache is dull aching and is frontal in location.  Patient denies changes in vision, chest pain, shortness of breath, proximal nocturnal dyspnea.  Patient states he is unaware that he snores.  Patient states he was diagnosed with hypertension at age 66, patient states he was never referred to a cardiologist.  Patient states he is never had an ultrasound performed of his renal arteries.  EMR reviewed by me, previous lab results reviewed with patient.  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  Patient states that both of his parents had kidney failure but states that neither one of them were on dialysis as far as he knows.  The history is provided by the patient.   Past Medical History:  Diagnosis Date   Anxiety    Asthma    Condyloma 05/08/2020   Depression     Genital herpes 04/02/2011   Hypertension    Inadequate sleep hygiene 11/04/2020   Nodule of anus 07/30/2021   Pain in the abdomen 03/25/2021   Patient Active Problem List   Diagnosis Date Noted   Suspected sleep apnea 11/04/2020   Generalized headaches 09/20/2020   Encounter for immunization 09/20/2020   Fatigue 05/08/2020   Anxiety 02/06/2018   Depression 01/11/2014   ESSENTIAL HYPERTENSION, BENIGN 12/11/2010   OBESITY, NOS 02/09/2007   Allergic rhinitis 02/09/2007   Asthma, mild intermittent 02/09/2007   Past Surgical History:  Procedure Laterality Date   None      Home Medications    Prior to Admission medications   Medication Sig Start Date End Date Taking? Authorizing Provider  hydrochlorothiazide (HYDRODIURIL) 12.5 MG tablet Take 1 tablet (12.5 mg total) by mouth daily. 10/26/21 01/24/22 Yes Lynden Oxford Scales, PA-C  albuterol (VENTOLIN HFA) 108 (90 Base) MCG/ACT inhaler Inhale 1-2 puffs into the lungs every 6 (six) hours as needed for wheezing or shortness of breath. 10/13/20   Lilland, Alana, DO  amLODipine (NORVASC) 5 MG tablet Take 1 tablet (5 mg total) by mouth at bedtime. 10/26/21 01/24/22  Lynden Oxford Scales, PA-C  Cetirizine HCl 10 MG CAPS Take 1 capsule (10 mg total) by mouth daily for 10 days. 09/08/21 09/18/21  Wieters, Hallie C, PA-C  fluticasone furoate-vilanterol (BREO ELLIPTA) 200-25 MCG/INH AEPB Inhale 1 puff into the lungs daily. 08/18/21  Lilland, Alana, DO  ibuprofen (ADVIL) 800 MG tablet Take 1 tablet (800 mg total) by mouth 3 (three) times daily. 09/08/21   Wieters, Elesa Hacker, PA-C  Spacer/Aero-Hold Chamber Bags MISC Use with inhaler 03/22/19   Martyn Malay, MD   Family History Family History  Problem Relation Age of Onset   Depression Mother    Hypertension Mother    Allergic rhinitis Mother    Alcohol abuse Father    Drug abuse Father    Stroke Father    Hypertension Father    Allergic rhinitis Father    Alcohol abuse Paternal Uncle     Hypertension Brother    Angioedema Neg Hx    Asthma Neg Hx    Atopy Neg Hx    Eczema Neg Hx    Immunodeficiency Neg Hx    Urticaria Neg Hx    Social History Social History   Tobacco Use   Smoking status: Never   Smokeless tobacco: Never  Vaping Use   Vaping Use: Never used  Substance Use Topics   Alcohol use: No   Drug use: No   Allergies   Patient has no known allergies.  Review of Systems Review of Systems Pertinent findings noted in history of present illness.   Physical Exam Triage Vital Signs ED Triage Vitals  Enc Vitals Group     BP 10/09/21 0827 (!) 147/82     Pulse Rate 10/09/21 0827 72     Resp 10/09/21 0827 18     Temp 10/09/21 0827 98.3 F (36.8 C)     Temp Source 10/09/21 0827 Oral     SpO2 10/09/21 0827 98 %     Weight --      Height --      Head Circumference --      Peak Flow --      Pain Score 10/09/21 0826 5     Pain Loc --      Pain Edu? --      Excl. in [place]? --    No data found.  Updated Vital Signs BP 136/88 (BP Location: Left Arm)   Pulse 76   Temp 98.7 F (37.1 C) (Oral)   Resp 16   SpO2 98%   Visual Acuity Right Eye Distance:   Left Eye Distance:   Bilateral Distance:    Right Eye Near:   Left Eye Near:    Bilateral Near:     Physical Exam Vitals and nursing note reviewed.  Constitutional:      General: He is not in acute distress.    Appearance: Normal appearance. He is not ill-appearing.  HENT:     Head: Normocephalic and atraumatic.     Salivary Glands: Right salivary gland is not diffusely enlarged or tender. Left salivary gland is not diffusely enlarged or tender.     Right Ear: Tympanic membrane, ear canal and external ear normal. No drainage. No middle ear effusion. There is no impacted cerumen. Tympanic membrane is not erythematous or bulging.     Left Ear: Tympanic membrane, ear canal and external ear normal. No drainage.  No middle ear effusion. There is no impacted cerumen. Tympanic membrane is not  erythematous or bulging.     Nose: Nose normal. No nasal deformity, septal deviation, mucosal edema, congestion or rhinorrhea.     Right Turbinates: Not enlarged, swollen or pale.     Left Turbinates: Not enlarged, swollen or pale.     Right Sinus: No maxillary sinus tenderness  or frontal sinus tenderness.     Left Sinus: No maxillary sinus tenderness or frontal sinus tenderness.     Mouth/Throat:     Lips: Pink. No lesions.     Mouth: Mucous membranes are moist. No oral lesions.     Pharynx: Oropharynx is clear. Uvula midline. No posterior oropharyngeal erythema or uvula swelling.     Tonsils: No tonsillar exudate. 0 on the right. 0 on the left.  Eyes:     General: Lids are normal.        Right eye: No discharge.        Left eye: No discharge.     Extraocular Movements: Extraocular movements intact.     Conjunctiva/sclera: Conjunctivae normal.     Right eye: Right conjunctiva is not injected.     Left eye: Left conjunctiva is not injected.  Neck:     Trachea: Trachea and phonation normal.  Cardiovascular:     Rate and Rhythm: Normal rate and regular rhythm.     Pulses: Normal pulses.     Heart sounds: Normal heart sounds. No murmur heard.   No friction rub. No gallop.  Pulmonary:     Effort: Pulmonary effort is normal. No accessory muscle usage, prolonged expiration or respiratory distress.     Breath sounds: Normal breath sounds. No stridor, decreased air movement or transmitted upper airway sounds. No decreased breath sounds, wheezing, rhonchi or rales.  Chest:     Chest wall: No tenderness.  Musculoskeletal:        General: Normal range of motion.     Cervical back: Normal range of motion and neck supple. Normal range of motion.  Lymphadenopathy:     Cervical: No cervical adenopathy.  Skin:    General: Skin is warm and dry.     Findings: No erythema or rash.  Neurological:     General: No focal deficit present.     Mental Status: He is alert and oriented to person, place,  and time.  Psychiatric:        Mood and Affect: Mood normal.        Behavior: Behavior normal.   UC Treatments / Results  Labs (all labs ordered are listed, but only abnormal results are displayed)  Labs Reviewed - No data to display  EKG  Radiology No results found.  Procedures Procedures (including critical care time)  Medications Ordered in UC Medications - No data to display  Initial Impression / Assessment and Plan / UC Course  I have reviewed the triage vital signs and the nursing notes.  Pertinent labs & imaging results that were available during my care of the patient were reviewed by me and considered in my medical decision making (see chart for details).      I want discussion today with this patient regarding the importance of further evaluation relative to his hypertension particularly given his young age of diagnosis and current kidney failure.  Patient adds that his brother currently is in kidney failure as well.  Patient was provided with information regarding renal ultrasounds, referral to cardiology, obstructive sleep apnea, keeping a diary of his headache and frequent follow-up until he feels that his blood pressure is well controlled and asymptomatic.  I provided patient with a new prescription for the hydrochlorothiazide 12.5 mg and advised him to take amlodipine 5 mg with this every morning.  Patient states he has a follow-up appointment coming up soon with his primary care provider.  I encouraged him to keep  that appointment and to discuss with them referral to cardiology and renal ultrasound.  At patient's cell request, I have also initiated a request to help him find a new primary care provider.  Final Clinical Impressions(s) / UC Diagnoses   Final diagnoses:  Nonintractable episodic headache, unspecified headache type  Morning headache  Secondary hypertension  Hypertension in child age 14-18     Discharge Instructions      For your high blood  pressure, please begin taking a combination of amlodipine 5 mg and hydrochlorothiazide 12.5 mg, 1 tablet of each every morning.  If you would like to follow-up here at urgent care to recheck your blood pressure in 1 week you are welcome to do so.  Because you have had high blood pressure since age 39, is important that you have a thorough evaluation of underlying causes for your blood pressure, particularly since both of your parents suffer from blood pressure issues and your brother is now experiencing kidney failure.  As we discussed, you are showing early signs of kidney failure self.  The first recommendation I have for work-up would be for you to have an ultrasound of your kidneys performed.  This will evaluate whether or not you have any constrictions of the arteries that provide blood to your kidneys.  If this is found, treatment can be provided.  This test can be ordered by your primary care provider but can also be ordered by a nephrologist which is a kidney doctor.  I think it is worthwhile for you to be seen by kidney doctor for at least 1 visit to discuss your blood pressure medications, develop a plan of care and possibly to have regular 6 to 25-month follow-up.  Finally, I believe it is of value to see a cardiologist to see if there are any further recommendations they can provide, I would certainly recommend this if the ultrasound of your kidney is normal and the nephrologist does not feel that you need to follow-up with them perhaps she should wait for referral to cardiology until after you have seen the kidney doctor.  For your headaches, I agree with your doctor that is important for you to keep a headache diary.  I provided a form for you to fill out so keep this close to you so that you can document your headaches as they occur.  This is helpful information for your primary care provider and can help determine whether or not you need to be seen by a neurologist, headache  specialist.  I am sorry that you do not feel that you have had consistent follow-up or been offered a consistent plan for managing her blood pressure and headaches.  I have initiated a request in our system to help you find a new primary care provider.     ED Prescriptions     Medication Sig Dispense Auth. Provider   amLODipine (NORVASC) 5 MG tablet Take 1 tablet (5 mg total) by mouth at bedtime. 30 tablet Theadora Rama Scales, PA-C   hydrochlorothiazide (HYDRODIURIL) 12.5 MG tablet Take 1 tablet (12.5 mg total) by mouth daily. 30 tablet Theadora Rama Scales, PA-C      PDMP not reviewed this encounter.  Disposition Upon Discharge:  Patient presented with an acute illness with associated systemic symptoms and significant discomfort requiring urgent management. In my opinion, this is a condition that a prudent lay person (someone who possesses an average knowledge of health and medicine) may potentially expect to result in complications if not  addressed urgently such as respiratory distress, impairment of bodily function or dysfunction of bodily organs.   Routine symptom specific, illness specific and/or disease specific instructions were discussed with the patient and/or caregiver at length.   As such, the patient has been evaluated and assessed, work-up was performed and treatment was provided in alignment with urgent care protocols and evidence based medicine.  Patient/parent/caregiver has been advised that the patient may require follow up for further testing and treatment if the symptoms continue in spite of treatment, as clinically indicated and appropriate. Once Patieshe will pt/parent/caregiver has been advised to return to the [facility] or PCP in 3-5 days if no better; to PCP or the Emergency Department if new signs and symptoms develop, or if the current signs or symptoms continue to change or worsen for further workup, evaluation and treatment as clinically indicated and  appropriate  The patient will follow up with their current PCP if and as advised. If the patient does not currently have a PCP we will assist them in obtaining one.   The patient may need specialty follow up if the symptoms continue, in spite of conservative treatment and management, for further workup, evaluation, consultation and treatment as clinically indicated and appropriate.  Patient/parent/caregiver verbalized understanding and agreement of plan as discussed.  All questions were addressed during visit.  Please see discharge instructions below for further details of plan.  Condition: stable for discharge home Home: take medications as prescribed; routine discharge instructions as discussed; follow up as advised.;   Lynden Oxford Scales, PA-C 10/27/21 [phone]

## 2021-10-26 NOTE — ED Triage Notes (Signed)
Pt presents with high blood pressure. States reading this am was 170/91. States having constant headaches. Unable to get in with PCP today.

## 2021-10-26 NOTE — Telephone Encounter (Signed)
Patient calls nurse line regarding concerns with elevated BP since having medication changed from HCTZ to amlodipine.   Patient reports that BP has been running in the 170's/100's.   States that currently he has a severe headache. We do not have any appointments until today. Recommended UC evaluation. Patient verbalizes understanding.   Veronda Prude, RN

## 2021-10-26 NOTE — Discharge Instructions (Addendum)
For your high blood pressure, please begin taking a combination of amlodipine 5 mg and hydrochlorothiazide 12.5 mg, 1 tablet of each every morning.  If you would like to follow-up here at urgent care to recheck your blood pressure in 1 week you are welcome to do so.  Because you have had high blood pressure since age 35, is important that you have a thorough evaluation of underlying causes for your blood pressure, particularly since both of your parents suffer from blood pressure issues and your brother is now experiencing kidney failure.  As we discussed, you are showing early signs of kidney failure self.  The first recommendation I have for work-up would be for you to have an ultrasound of your kidneys performed.  This will evaluate whether or not you have any constrictions of the arteries that provide blood to your kidneys.  If this is found, treatment can be provided.  This test can be ordered by your primary care provider but can also be ordered by a nephrologist which is a kidney doctor.  I think it is worthwhile for you to be seen by kidney doctor for at least 1 visit to discuss your blood pressure medications, develop a plan of care and possibly to have regular 6 to 11-month follow-up.  Finally, I believe it is of value to see a cardiologist to see if there are any further recommendations they can provide, I would certainly recommend this if the ultrasound of your kidney is normal and the nephrologist does not feel that you need to follow-up with them perhaps she should wait for referral to cardiology until after you have seen the kidney doctor.  For your headaches, I agree with your doctor that is important for you to keep a headache diary.  I provided a form for you to fill out so keep this close to you so that you can document your headaches as they occur.  This is helpful information for your primary care provider and can help determine whether or not you need to be seen by a neurologist,  headache specialist.  I am sorry that you do not feel that you have had consistent follow-up or been offered a consistent plan for managing her blood pressure and headaches.  I have initiated a request in our system to help you find a new primary care provider.

## 2021-10-27 ENCOUNTER — Encounter: Payer: Self-pay | Admitting: Family Medicine

## 2021-10-27 ENCOUNTER — Ambulatory Visit (INDEPENDENT_AMBULATORY_CARE_PROVIDER_SITE_OTHER): Payer: 59 | Admitting: Family Medicine

## 2021-10-27 VITALS — BP 156/86 | HR 79 | Ht 74.0 in | Wt 274.0 lb

## 2021-10-27 DIAGNOSIS — R5383 Other fatigue: Secondary | ICD-10-CM

## 2021-10-27 DIAGNOSIS — I10 Essential (primary) hypertension: Secondary | ICD-10-CM

## 2021-10-27 MED ORDER — LOSARTAN POTASSIUM 25 MG PO TABS: 25.0000 mg | ORAL_TABLET | Freq: Every day | ORAL | 3 refills | Status: DC

## 2021-10-27 NOTE — Progress Notes (Signed)
    SUBJECTIVE:   CHIEF COMPLAINT / HPI:   Scott Patterson (MRN: [id number]) is a 35 y.o. male with a history of HTN and asthma who presents for evaluation of headaches in the setting of high blood pressure.  HTN Patient states he has been experiencing headaches since changing his BP medication from HCTZ to amlodipine at his last PCP visit 2 weeks ago. His headaches are all around his head and constant. He does endorse some blurred vision with these headaches, but it resolves when the headaches stop. He denies any chest pain. He has been using Tylenol to control the headaches, but they come back when the medication wears off.   Of note, he has had to be on BP medications since he was a teenager. His brother, who is 43 years old, is experiencing kidney failure and is awaiting a transplant. His mother and his father both have hypertension as well. In addition, he strength trains 4-5 times per week, and at night, he sometimes wakes up needing more air and feeling fatigued. He is unsure of if he snores. He has never been diagnosed with OSA.  OBJECTIVE:   BP (!) 156/86   Pulse 79   Ht 6\' 2"  (1.88 m)   Wt 274 lb (124.3 kg)   SpO2 100%   BMI 35.18 kg/m    PHYSICAL EXAM  GEN: Well-developed, in NAD HEAD: NCAT CVS: RRR, normal S1/S2, no murmurs, rubs, gallops RESP: Breathing comfortably on RA, no retractions, wheezes, rhonchi, or crackles PSY: Normal mood and affect, alert and oriented NEU: CN II-XII intact bilaterally, strength and sensation 5/5 throughout, DTRs 2+ bilaterally, normal gait EXT: Moves all extremities grossly equally    ASSESSMENT/PLAN:   ESSENTIAL HYPERTENSION, BENIGN Patient's persistent HTN since he was a teenager and elevated creatinine, in conjunction with his strong family history of kidney problems and HTN, warrant further workup to rule out secondary causes of HTN such as hyperaldosteronism, renal artery stenosis, and OSA. His BP today on max dose of amlodipine is  156/86, which is above goal, warranting a medication change. Will add losartan 25 mg to take after lab appointment tomorrow in addition to amlodipine 10 mg to control HTN and symptoms of headache. Ordered BMP and UA to assess kidney function, as well as a renin/aldosterone activity ratio to assess for hyperaldosteronism, to be competed at a lab visit tomorrow morning (11/16) at 8:30 AM. Also ordered 03-18-2000 renal artery duplex to evaluate for renal artery stenosis and a sleep study to evaluate for OSA given symptoms and STOP-BANG score of 3. Will follow up results.   [place], Medical Student [facility]

## 2021-10-27 NOTE — Patient Instructions (Addendum)
It was wonderful to see you today.  Please bring ALL of your medications with you to every visit.   Today we talked about:  - You will come come back in tomorrow at 0800 for lab work, then start taking losartan 25 mg daily and come back in one week to repeat a renal function test - continue taking your amlodipine 10mg  daily - we have scheduled a renal ultrasound for you - You can continue to take tylenol for headaches up to 4 g daily, do not take ibuprofen for pain - We will refer you for a sleep study, this is done with a pulmonologist   Thank you for choosing [facility].   Please call [phone] with any questions about today's appointment.  Please be sure to schedule follow up at the front  desk before you leave today.   [phone], MD  Family Medicine

## 2021-10-27 NOTE — Assessment & Plan Note (Addendum)
Patient's persistent HTN since he was a teenager and elevated creatinine, in conjunction with his strong family history of kidney problems and HTN, warrant further workup to rule out secondary causes of HTN such as hyperaldosteronism, renal artery stenosis, and OSA. His BP today on max dose of amlodipine is 156/86, which is above goal, warranting a medication change. Will add losartan 25 mg to take after lab appointment tomorrow in addition to amlodipine 10 mg to control HTN and symptoms of headache. Ordered BMP and UA to assess kidney function, as well as a renin/aldosterone activity ratio to assess for hyperaldosteronism, to be competed at a lab visit tomorrow morning (11/16) at 8:30 AM. Also ordered US renal artery duplex to evaluate for renal artery stenosis and a sleep study to evaluate for OSA given symptoms and STOP-BANG score of 3. Will follow up results.

## 2021-10-27 NOTE — Progress Notes (Deleted)
Headaches x 2 weeks every day, happened after switching medicine from HCTZ to amlodipine. Switched due to bumped up Cr and try to bring it down. Works out [employer]/video 4-5 times per week. Vision slightly more blurry, especially on the left. No chest pains. Tylenol 200 mg x3 BID for headaches that helps at the time, but it always come back.   Brother, 30-31, has kidney failure. Mother and father both HTN but NOT kidney failure.

## 2021-10-28 ENCOUNTER — Other Ambulatory Visit (INDEPENDENT_AMBULATORY_CARE_PROVIDER_SITE_OTHER): Payer: 59

## 2021-10-28 ENCOUNTER — Other Ambulatory Visit: Payer: Self-pay

## 2021-10-28 DIAGNOSIS — I10 Essential (primary) hypertension: Secondary | ICD-10-CM

## 2021-10-28 LAB — POCT URINALYSIS DIP (MANUAL ENTRY)
Bilirubin, UA: NEGATIVE
Blood, UA: NEGATIVE
Glucose, UA: NEGATIVE mg/dL
Ketones, POC UA: NEGATIVE mg/dL
Leukocytes, UA: NEGATIVE
Nitrite, UA: NEGATIVE
Protein Ur, POC: NEGATIVE mg/dL
Spec Grav, UA: 1.025 (ref 1.010–1.025)
Urobilinogen, UA: 0.2 E.U./dL
pH, UA: 5.5 (ref 5.0–8.0)

## 2021-10-29 ENCOUNTER — Telehealth: Payer: Self-pay | Admitting: Family Medicine

## 2021-10-29 ENCOUNTER — Other Ambulatory Visit: Payer: Self-pay | Admitting: Family Medicine

## 2021-10-29 DIAGNOSIS — I10 Essential (primary) hypertension: Secondary | ICD-10-CM

## 2021-10-29 NOTE — Telephone Encounter (Signed)
Called patient with results of BMP. Hyperkalemia present. Discussed. Stop losartan. Re-start amlodipine. HCTZ was stopped for rise in GFR. Follow up scheduled with pharmacy. Would consider restarting thiazide (chlorthalidone?) vs. Adding SGLT2. CCB appears to be causing mild headaches.  Terisa Starr, MD  Family Medicine Teaching Service

## 2021-11-01 LAB — BASIC METABOLIC PANEL
BUN/Creatinine Ratio: 9 (ref 9–20)
BUN: 12 mg/dL (ref 6–20)
CO2: 28 mmol/L (ref 20–29)
Calcium: 10.1 mg/dL (ref 8.7–10.2)
Chloride: 105 mmol/L (ref 96–106)
Creatinine, Ser: 1.34 mg/dL — ABNORMAL HIGH (ref 0.76–1.27)
Glucose: 90 mg/dL (ref 70–99)
Potassium: 5.3 mmol/L — ABNORMAL HIGH (ref 3.5–5.2)
Sodium: 145 mmol/L — ABNORMAL HIGH (ref 134–144)
eGFR: 70 mL/min/{1.73_m2} (ref >59)

## 2021-11-01 LAB — ALDOSTERONE + RENIN ACTIVITY W/ RATIO
ALDOS/RENIN RATIO: 9.2 (ref 0.0–30.0)
ALDOSTERONE: 4.4 ng/dL (ref 0.0–30.0)
Renin: 0.48 ng/mL/hr (ref 0.167–5.380)

## 2021-11-02 ENCOUNTER — Other Ambulatory Visit: Payer: Self-pay

## 2021-11-02 ENCOUNTER — Ambulatory Visit: Payer: 59 | Admitting: Pharmacist

## 2021-11-02 ENCOUNTER — Ambulatory Visit (HOSPITAL_COMMUNITY)
Admission: RE | Admit: 2021-11-02 | Discharge: 2021-11-02 | Disposition: A | Discharge status: Home / Self Care | Payer: 59 | Source: Ambulatory Visit | Attending: Family Medicine | Admitting: Family Medicine

## 2021-11-02 ENCOUNTER — Ambulatory Visit: Payer: 59

## 2021-11-02 DIAGNOSIS — I10 Essential (primary) hypertension: Secondary | ICD-10-CM | POA: Diagnosis present

## 2021-11-02 NOTE — Progress Notes (Signed)
Renal artery duplex completed. Refer to "CV Proc" under chart review to view preliminary results.  11/02/2021 9:24 AM Eula Fried., MHA, RVT, RDCS, RDMS

## 2021-11-03 ENCOUNTER — Ambulatory Visit (INDEPENDENT_AMBULATORY_CARE_PROVIDER_SITE_OTHER): Payer: 59 | Admitting: Pharmacist

## 2021-11-03 ENCOUNTER — Encounter: Payer: Self-pay | Admitting: Pharmacist

## 2021-11-03 DIAGNOSIS — I10 Essential (primary) hypertension: Secondary | ICD-10-CM | POA: Diagnosis not present

## 2021-11-03 MED ORDER — HYDROCHLOROTHIAZIDE 12.5 MG PO TABS: 12.5000 mg | ORAL_TABLET | Freq: Every day | ORAL | 2 refills | Status: DC

## 2021-11-03 MED ORDER — FLUTICASONE PROPIONATE 50 MCG/ACT NA SUSP
2.0000 | Freq: Two times a day (BID) | NASAL | 11 refills | Status: DC
Start: 2021-11-03 — End: 2022-04-17

## 2021-11-03 MED ORDER — NIFEDIPINE ER OSMOTIC RELEASE 30 MG PO TB24: 30.0000 mg | ORAL_TABLET | Freq: Every day | ORAL | 3 refills | Status: DC

## 2021-11-03 NOTE — Progress Notes (Signed)
Reviewed: I agree with Dr. Koval's documentation and management. 

## 2021-11-03 NOTE — Progress Notes (Signed)
   S:    Patient arrives in a pleasant mood, ambulating without assistance. Presents to the clinic for hypertension evaluation, counseling, and management.  Patient was referred on 11/17 by Dr. Manson Passey.  Patient was last seen by Primary Care Provider Dr. Clayborne Artist on 11/1.   Medication adherence good.   Current BP Medications include:  amlodipine 10 mg daily  Antihypertensives tried in the past include: losartan (stopped due to hyperkalemia on 11/17), HCTZ (stopped due to renal concerns on 11/17)  Dietary habits include: reduced salt, cut out fried foods Exercise habits include:going to the gym  Family / Social history: does not smoke or drink alcohol   O:  Physical Exam Constitutional:      Appearance: Normal appearance. He is obese.  Neurological:     Mental Status: He is alert.    Review of Systems  Neurological:  Positive for headaches (Continues to have daily headaches).  All other systems reviewed and are negative.  Last 3 Office BP readings: BP Readings from Last 3 Encounters:  11/03/21 138/76  10/27/21 (!) 156/86  10/26/21 136/88    BMET    Component Value Date/Time   NA 145 (H) 10/28/2021 0854   K 5.3 (H) 10/28/2021 0854   CL 105 10/28/2021 0854   CO2 28 10/28/2021 0854   GLUCOSE 90 10/28/2021 0854   GLUCOSE 84 09/12/2021 1534   BUN 12 10/28/2021 0854   CREATININE 1.34 (H) 10/28/2021 0854   CREATININE 1.43 (H) 01/20/2017 1141   CALCIUM 10.1 10/28/2021 0854   GFRNONAA >60 09/12/2021 1534   GFRNONAA 65 01/20/2017 1141   GFRAA 79 05/08/2020 1033   GFRAA 75 01/20/2017 1141    Renal function: Estimated Creatinine Clearance: 106.7 mL/min (A) (by C-G formula based on SCr of 1.34 mg/dL (H)).  Clinical ASCVD: No  The ASCVD Risk score (Arnett DK, et al., 2019) failed to calculate for the following reasons:   The 2019 ASCVD risk score is only valid for ages 64 to 19  A/P: Hypertension currently with improved control. BP Goal = < 130/80 mmHg. Medication  adherence good. Due to frequent and bothersome headaches from amlodipine but with good BP control, switching to another DHP-CCB at lower dose which may reduce headaches. -Discontinued amlodipine due to headaches.  - Started nifedipine 30 mg daily - Restarted HCTZ 12.5 mg once daily. Counseled patient on importance of staying hydrated to prevent kidney injury.  -F/u labs ordered - BMET to reassess K+, if normal may consider adding on ARB/ACE or spironolactone in future visits.  -Counseled on lifestyle modifications for blood pressure control including reduced dietary sodium, increased exercise, adequate sleep. Consider amb BP monitoring at next Rx Clinic visit in 2-3 weeks.   Allergic rhinits controlled with current tx.  - Refilled Flonase (fluticasone) nasal spray pre patient request.   Results reviewed and written information provided.   Total time in face-to-face counseling 35 minutes.   F/U Pharmacy Clinic Visit on 12/12.  Patient seen with Ruben Im, PharmD Candidate.

## 2021-11-03 NOTE — Assessment & Plan Note (Signed)
Hypertension currently with improved control. BP Goal = < 130/80 mmHg. Medication adherence good. Due to frequent and bothersome headaches from amlodipine but with good BP control, switching to another DHP-CCB at lower dose which may reduce headaches. -Discontinued amlodipine due to headaches.  - Started nifedipine 30 mg daily - Restarted HCTZ 12.5 mg once daily. Counseled patient on importance of staying hydrated to prevent kidney injury.  -F/u labs ordered - BMET to reassess K+, if normal may consider adding on ARB/ACE or spironolactone in future visits.  -Counseled on lifestyle modifications for blood pressure control including reduced dietary sodium, increased exercise, adequate sleep.

## 2021-11-03 NOTE — Patient Instructions (Addendum)
Nice to meet you today.   STOP Amlodipine  Start Nifedipine 30mg  once daily.   Re-start HCTZ (hydrochlorothiazide) 12.5mg  once daily    I also refilled your flonase.

## 2021-11-04 ENCOUNTER — Telehealth: Payer: Self-pay | Admitting: Family Medicine

## 2021-11-04 DIAGNOSIS — E875 Hyperkalemia: Secondary | ICD-10-CM

## 2021-11-04 LAB — BASIC METABOLIC PANEL
BUN/Creatinine Ratio: 12 (ref 9–20)
BUN: 14 mg/dL (ref 6–20)
CO2: 24 mmol/L (ref 20–29)
Calcium: 9.7 mg/dL (ref 8.7–10.2)
Chloride: 98 mmol/L (ref 96–106)
Creatinine, Ser: 1.2 mg/dL (ref 0.76–1.27)
Glucose: 65 mg/dL — ABNORMAL LOW (ref 70–99)
Potassium: 5.6 mmol/L — ABNORMAL HIGH (ref 3.5–5.2)
Sodium: 139 mmol/L (ref 134–144)
eGFR: 80 mL/min/{1.73_m2} (ref >59)

## 2021-11-04 NOTE — Telephone Encounter (Signed)
Order faxed to [facility].   Veronda Prude, RN

## 2021-11-04 NOTE — Telephone Encounter (Signed)
D/w patient normal Cr, elevated K. Suspect hemolysis/issue with lab. Mr Scott Patterson is agreeable to go to [facility] across the street at [facility] [employer] [facility] [employer] first floor to have BMP repeated. Future ordered and message sent to RN to fax order over.  All questions/concerns addressed.  Burley Saver MD

## 2021-11-23 ENCOUNTER — Ambulatory Visit: Payer: 59 | Admitting: Pharmacist

## 2021-12-13 ENCOUNTER — Encounter (HOSPITAL_BASED_OUTPATIENT_CLINIC_OR_DEPARTMENT_OTHER): Payer: Self-pay | Admitting: Urology

## 2021-12-13 ENCOUNTER — Emergency Department (HOSPITAL_BASED_OUTPATIENT_CLINIC_OR_DEPARTMENT_OTHER): Payer: 59

## 2021-12-13 ENCOUNTER — Emergency Department (HOSPITAL_BASED_OUTPATIENT_CLINIC_OR_DEPARTMENT_OTHER)
Admission: EM | Admit: 2021-12-13 | Discharge: 2021-12-13 | Disposition: A | Discharge status: Home / Self Care | Payer: 59 | Attending: Emergency Medicine | Admitting: Emergency Medicine

## 2021-12-13 ENCOUNTER — Other Ambulatory Visit: Payer: Self-pay

## 2021-12-13 DIAGNOSIS — U071 COVID-19: Secondary | ICD-10-CM | POA: Diagnosis not present

## 2021-12-13 DIAGNOSIS — R0602 Shortness of breath: Secondary | ICD-10-CM | POA: Diagnosis present

## 2021-12-13 DIAGNOSIS — R000 Tachycardia, unspecified: Secondary | ICD-10-CM | POA: Diagnosis not present

## 2021-12-13 LAB — RESP PANEL BY RT-PCR (FLU A&B, COVID) ARPGX2
Influenza A by PCR: NEGATIVE
Influenza B by PCR: NEGATIVE
SARS Coronavirus 2 by RT PCR: POSITIVE — AB

## 2021-12-13 MED ORDER — ACETAMINOPHEN 325 MG PO TABS
650.0000 mg | ORAL_TABLET | Freq: Once | ORAL | Status: AC | PRN
  Administered 2021-12-13: 650 mg via ORAL
  Filled 2021-12-13: qty 2

## 2021-12-13 NOTE — ED Triage Notes (Signed)
Body aches, Productive cough, shortness of breath since last night.   H/o asthma Denies n/v

## 2021-12-13 NOTE — ED Notes (Signed)
Patient discharged to home.  All discharge instructions reviewed.  Patient verbalized understanding via teachback method.  VS WDL.  Respirations even and unlabored.  Ambulatory out of ED.   °

## 2021-12-13 NOTE — ED Provider Notes (Signed)
[facility] EMERGENCY DEPARTMENT Provider Note   CSN: [id number] Arrival date & time: 12/13/21  2006     History  Chief Complaint  Patient presents with   Shortness of Breath    Scott Patterson is a 36 y.o. male.   Shortness of Breath Associated symptoms: cough   Associated symptoms: no abdominal pain, no chest pain, no ear pain, no headaches, no neck pain, no rash, no sore throat and no vomiting   Patient presents for flulike symptoms.  Onset was last night.  Symptoms have included diffuse myalgias, cough, shortness of breath.  He does have a history of asthma.  He typically does not use inhalers.  He states that he has not been using any inhalers recently.  He has not had any vomiting or diarrhea.  He has had normal p.o. intake.  Patient was unaware of any fevers at home.  Patient works from home.  He is unaware of any sick contacts that he has encountered recently.    Home Medications Prior to Admission medications   Medication Sig Start Date End Date Taking? Authorizing Provider  acetaminophen (TYLENOL) 500 MG tablet Take 1,000 mg by mouth every 8 (eight) hours as needed.    [provider]  albuterol (VENTOLIN HFA) 108 (90 Base) MCG/ACT inhaler Inhale 1-2 puffs into the lungs every 6 (six) hours as needed for wheezing or shortness of breath. 10/13/20   Lilland, Alana, DO  Cetirizine HCl 10 MG CAPS Take 1 capsule (10 mg total) by mouth daily for 10 days. 09/08/21 11/03/21  Wieters, Hallie C, PA-C  fluticasone (FLONASE) 50 MCG/ACT nasal spray Place 2 sprays into both nostrils in the morning and at bedtime. 11/03/21   Moses Manners, MD  fluticasone furoate-vilanterol (BREO ELLIPTA) 200-25 MCG/INH AEPB Inhale 1 puff into the lungs daily. 08/18/21   Lilland, Alana, DO  hydrochlorothiazide (HYDRODIURIL) 12.5 MG tablet Take 1 tablet (12.5 mg total) by mouth daily. 11/03/21 02/01/22  Moses Manners, MD  ibuprofen (ADVIL) 800 MG tablet Take 1 tablet (800 mg total) by  mouth 3 (three) times daily. Patient not taking: Reported on 11/03/2021 09/08/21   Wieters, Hallie C, PA-C  NIFEdipine (PROCARDIA-XL/NIFEDICAL-XL) 30 MG 24 hr tablet Take 1 tablet (30 mg total) by mouth daily. 11/03/21   Moses Manners, MD  predniSONE (DELTASONE) 10 MG tablet Take 4 tablets (40 mg total) by mouth daily for 4 days. 12/15/21 12/19/21  Alvira Monday, MD  Spacer/Aero-Hold Chamber Bags MISC Use with inhaler Patient not taking: Reported on 11/03/2021 03/22/19   Westley Chandler, MD      Allergies    Patient has no known allergies.    Review of Systems   Review of Systems  Constitutional:  Positive for appetite change, chills and fatigue.  HENT:  Positive for congestion. Negative for ear pain, sinus pain, sore throat and trouble swallowing.   Eyes:  Negative for pain and visual disturbance.  Respiratory:  Positive for cough and shortness of breath.   Cardiovascular:  Negative for chest pain, palpitations and leg swelling.  Gastrointestinal:  Negative for abdominal pain, diarrhea, nausea and vomiting.  Genitourinary:  Negative for dysuria, flank pain and hematuria.  Musculoskeletal:  Positive for myalgias. Negative for arthralgias, back pain, gait problem, joint swelling and neck pain.  Skin:  Negative for color change and rash.  Neurological:  Negative for dizziness, seizures, syncope, facial asymmetry, weakness, numbness and headaches.  Psychiatric/Behavioral:  Negative for confusion and decreased concentration.  All other systems reviewed and are negative.  Physical Exam Updated Vital Signs BP 132/82    Pulse (!) 102    Temp (!) 101.9 F (38.8 C) (Oral)    Resp 18    Ht 6\' 2"  (1.88 m)    Wt 120.2 kg    SpO2 100%    BMI 34.02 kg/m  Physical Exam Vitals and nursing note reviewed.  Constitutional:      General: He is not in acute distress.    Appearance: He is well-developed and normal weight. He is not ill-appearing, toxic-appearing or diaphoretic.  HENT:     Head:  Normocephalic and atraumatic.     Mouth/Throat:     Mouth: Mucous membranes are moist.     Pharynx: Oropharynx is clear.  Eyes:     Conjunctiva/sclera: Conjunctivae normal.  Neck:     Vascular: No JVD.  Cardiovascular:     Rate and Rhythm: Regular rhythm. Tachycardia present.     Heart sounds: No murmur heard. Pulmonary:     Effort: Pulmonary effort is normal. No tachypnea, accessory muscle usage or respiratory distress.     Breath sounds: Normal breath sounds. No decreased breath sounds, wheezing, rhonchi or rales.  Chest:     Chest wall: No tenderness.  Abdominal:     Palpations: Abdomen is soft.     Tenderness: There is no abdominal tenderness.  Musculoskeletal:        General: No swelling.     Cervical back: Normal range of motion and neck supple.     Right lower leg: No edema.     Left lower leg: No edema.  Skin:    General: Skin is warm and dry.     Capillary Refill: Capillary refill takes less than 2 seconds.     Coloration: Skin is not cyanotic or pale.  Neurological:     General: No focal deficit present.     Mental Status: He is alert and oriented to person, place, and time.     Cranial Nerves: No cranial nerve deficit.     Motor: No weakness.  Psychiatric:        Mood and Affect: Mood normal.        Behavior: Behavior normal.    ED Results / Procedures / Treatments   Labs (all labs ordered are listed, but only abnormal results are displayed) Labs Reviewed  RESP PANEL BY RT-PCR (FLU A&B, COVID) ARPGX2 - Abnormal; Notable for the following components:      Result Value   SARS Coronavirus 2 by RT PCR POSITIVE (*)    All other components within normal limits    EKG None  Radiology DG Chest Portable 1 View  Result Date: 12/13/2021 CLINICAL DATA:  Shortness of breath.  Cough.  Body aches. EXAM: PORTABLE CHEST 1 VIEW COMPARISON:  Two-view chest x-ray 09/12/2021 FINDINGS: Heart size exaggerated by low lung volumes. No edema or effusion is present. No focal  airspace disease is present. IMPRESSION: 1. Low lung volumes. 2. No acute cardiopulmonary disease. Electronically Signed   By: San Morelle M.D.   On: 12/13/2021 20:55    Procedures Procedures    Medications Ordered in ED Medications  acetaminophen (TYLENOL) tablet 650 mg (650 mg Oral Given 12/13/21 2017)    ED Course/ Medical Decision Making/ A&P                           Medical Decision Making  Healthy 36 year old  male presenting for 24 hours of flulike symptoms.  On arrival in the ED, he is febrile to 102.4 degrees.  Patient is tachycardic which is likely due to in part to his fever.  He also endorses recent decreased p.o. intake.  Vital signs are otherwise normal.  SPO2 is 100% on room air.  His breathing is even and unlabored.  I do not appreciate any wheezing on lung auscultation.  Patient received Tylenol for antipyresis.  Chest x-ray showed no focal opacities to suggest pneumonia and no cardiomegaly or pulmonary edema to suggest other etiologies of his subjective shortness of breath.  Patient did test positive for COVID-19.  These results were relayed to the patient.  At this point, he did experience relief of symptoms following the Tylenol.  He does feel comfortable with discharge home.  Patient was advised to continue supportive care with ibuprofen, Tylenol, and p.o. fluids.  He does work from home and will be able to quarantine.  He was discharged in good condition.        Final Clinical Impression(s) / ED Diagnoses Final diagnoses:  [id number]    Rx / DC Orders ED Discharge Orders     None         Godfrey Pick, MD 12/15/21 1644

## 2021-12-15 ENCOUNTER — Other Ambulatory Visit: Payer: Self-pay

## 2021-12-15 ENCOUNTER — Encounter (HOSPITAL_BASED_OUTPATIENT_CLINIC_OR_DEPARTMENT_OTHER): Payer: Self-pay | Admitting: Emergency Medicine

## 2021-12-15 ENCOUNTER — Emergency Department (HOSPITAL_BASED_OUTPATIENT_CLINIC_OR_DEPARTMENT_OTHER)
Admission: EM | Admit: 2021-12-15 | Discharge: 2021-12-15 | Disposition: A | Discharge status: Home / Self Care | Payer: 59 | Attending: Emergency Medicine | Admitting: Emergency Medicine

## 2021-12-15 DIAGNOSIS — U071 COVID-19: Secondary | ICD-10-CM | POA: Diagnosis not present

## 2021-12-15 DIAGNOSIS — J4531 Mild persistent asthma with (acute) exacerbation: Secondary | ICD-10-CM | POA: Diagnosis not present

## 2021-12-15 DIAGNOSIS — R0602 Shortness of breath: Secondary | ICD-10-CM | POA: Diagnosis present

## 2021-12-15 MED ORDER — IPRATROPIUM-ALBUTEROL 0.5-2.5 (3) MG/3ML IN SOLN
3.0000 mL | Freq: Once | RESPIRATORY_TRACT | Status: AC
  Administered 2021-12-15: 3 mL via RESPIRATORY_TRACT
  Filled 2021-12-15: qty 3

## 2021-12-15 MED ORDER — PREDNISONE 10 MG PO TABS: 40.0000 mg | ORAL_TABLET | Freq: Every day | ORAL | 0 refills | Status: AC

## 2021-12-15 MED ORDER — PREDNISONE 50 MG PO TABS
60.0000 mg | ORAL_TABLET | Freq: Once | ORAL | Status: AC
  Administered 2021-12-15: 60 mg via ORAL
  Filled 2021-12-15: qty 1

## 2021-12-15 MED ORDER — ALBUTEROL SULFATE HFA 108 (90 BASE) MCG/ACT IN AERS
2.0000 | INHALATION_SPRAY | Freq: Once | RESPIRATORY_TRACT | Status: AC
  Administered 2021-12-15: 2 via RESPIRATORY_TRACT
  Filled 2021-12-15: qty 6.7

## 2021-12-15 NOTE — ED Provider Notes (Signed)
[facility] EMERGENCY DEPARTMENT Provider Note   CSN: [id number] Arrival date & time: 12/15/21  [id number]     History  Chief Complaint  Patient presents with   Shortness of Breath    Scott Patterson is a 36 y.o. male.  HPI     36 year old male with history of asthma, hypertension, suspected sleep apnea, anxiety, previous hospitalization for asthma and COVID-19, who presents with concern for shortness of breath.  2 days ago, he presented to the emergency department was diagnosed with COVID-19.  He reports that he has had body aches, congestion and cough.  He had fever previously, but denies any fever today.  Has not had diarrhea, nausea, vomiting.  Shortness of breath began this morning.  It feels like his wheezing and his asthma.  Reports that when he woke up it was a 9 out of 10.  Denies any leg pain or swelling.  No history of DVT or PE.   Diagnosed with COVID 19 2 days ago  [place]  Home Medications Prior to Admission medications   Medication Sig Start Date End Date Taking? Authorizing Provider  predniSONE (DELTASONE) 10 MG tablet Take 4 tablets (40 mg total) by mouth daily for 4 days. 12/15/21 12/19/21 Yes Alvira MondaySchlossman, Rex Oesterle, MD  acetaminophen (TYLENOL) 500 MG tablet Take 1,000 mg by mouth every 8 (eight) hours as needed.    [provider]  albuterol (VENTOLIN HFA) 108 (90 Base) MCG/ACT inhaler Inhale 1-2 puffs into the lungs every 6 (six) hours as needed for wheezing or shortness of breath. 10/13/20   Lilland, Alana, DO  Cetirizine HCl 10 MG CAPS Take 1 capsule (10 mg total) by mouth daily for 10 days. 09/08/21 11/03/21  Wieters, Hallie C, PA-C  fluticasone (FLONASE) 50 MCG/ACT nasal spray Place 2 sprays into both nostrils in the morning and at bedtime. 11/03/21   Moses MannersHensel, William A, MD  fluticasone furoate-vilanterol (BREO ELLIPTA) 200-25 MCG/INH AEPB Inhale 1 puff into the lungs daily. 08/18/21   Lilland, Alana, DO   hydrochlorothiazide (HYDRODIURIL) 12.5 MG tablet Take 1 tablet (12.5 mg total) by mouth daily. 11/03/21 02/01/22  Moses MannersHensel, William A, MD  ibuprofen (ADVIL) 800 MG tablet Take 1 tablet (800 mg total) by mouth 3 (three) times daily. Patient not taking: Reported on 11/03/2021 09/08/21   Wieters, Hallie C, PA-C  NIFEdipine (PROCARDIA-XL/NIFEDICAL-XL) 30 MG 24 hr tablet Take 1 tablet (30 mg total) by mouth daily. 11/03/21   Moses MannersHensel, William A, MD  Spacer/Aero-Hold Chamber Bags MISC Use with inhaler Patient not taking: Reported on 11/03/2021 03/22/19   Westley ChandlerBrown, Carina M, MD      Allergies    Patient has no known allergies.    Review of Systems   Review of Systems  Constitutional:  Positive for fatigue. Negative for fever (the other day not now).  HENT:  Positive for congestion and sore throat.   Respiratory:  Positive for cough, shortness of breath and wheezing.   Cardiovascular:  Negative for chest pain.  Gastrointestinal:  Negative for abdominal pain, nausea and vomiting.  Genitourinary:  Negative for difficulty urinating.  Musculoskeletal:  Positive for myalgias. Negative for back pain and neck stiffness.  Skin:  Negative for rash.  Neurological:  Positive for light-headedness. Negative for syncope and headaches.   Physical Exam Updated Vital Signs BP 127/89 (BP Location: Right Arm)    Pulse (!) 102    Temp 98.1 F (36.7 C) (Oral)  Resp 20    Ht 6\' 2"  (1.88 m)    Wt 120.2 kg    SpO2 98%    BMI 34.02 kg/m  Physical Exam Vitals and nursing note reviewed.  Constitutional:      General: He is not in acute distress.    Appearance: He is well-developed. He is not diaphoretic.  HENT:     Head: Normocephalic and atraumatic.  Eyes:     Conjunctiva/sclera: Conjunctivae normal.  Cardiovascular:     Rate and Rhythm: Normal rate and regular rhythm.     Heart sounds: Normal heart sounds. No murmur heard.   No friction rub. No gallop.  Pulmonary:     Effort: Pulmonary effort is normal. No  respiratory distress.     Breath sounds: Normal breath sounds. No wheezing or rales.  Abdominal:     General: There is no distension.     Palpations: Abdomen is soft.     Tenderness: There is no abdominal tenderness. There is no guarding.  Musculoskeletal:     Cervical back: Normal range of motion.  Skin:    General: Skin is warm and dry.  Neurological:     Mental Status: He is alert and oriented to person, place, and time.    ED Results / Procedures / Treatments   Labs (all labs ordered are listed, but only abnormal results are displayed) Labs Reviewed - No data to display  EKG None  Radiology DG Chest Portable 1 View  Result Date: 12/13/2021 CLINICAL DATA:  Shortness of breath.  Cough.  Body aches. EXAM: PORTABLE CHEST 1 VIEW COMPARISON:  Two-view chest x-ray 09/12/2021 FINDINGS: Heart size exaggerated by low lung volumes. No edema or effusion is present. No focal airspace disease is present. IMPRESSION: 1. Low lung volumes. 2. No acute cardiopulmonary disease. Electronically Signed   By: 11/12/2021 M.D.   On: 12/13/2021 20:55    Procedures Procedures    Medications Ordered in ED Medications  ipratropium-albuterol (DUONEB) 0.5-2.5 (3) MG/3ML nebulizer solution 3 mL (3 mLs Nebulization Given 12/15/21 0735)  predniSONE (DELTASONE) tablet 60 mg (60 mg Oral Given 12/15/21 0732)  albuterol (VENTOLIN HFA) 108 (90 Base) MCG/ACT inhaler 2 puff (2 puffs Inhalation Given 12/15/21 0735)    ED Course/ Medical Decision Making/ A&P                           Medical Decision Making   36 year old male with history of asthma, hypertension, suspected sleep apnea, anxiety, previous hospitalization for asthma and COVID-19, who presents with concern for shortness of breath.  He has clear breath sounds bilaterally, good air movement, no's clinical sign of pneumonia, pulmonary edema, or pneumothorax.  He is in no respiratory distress with normal oxygenation on room air, however given his  history of asthma and sensation of wheezing similar to prior, suspect he does have symptoms consistent with asthma exacerbation secondary to his viral COVID-19 infection.  He is appropriate for outpatient treatment of his asthma exacerbation, although acknowledge significant history.  Given dose of prednisone and DuoNeb in the emergency department as well as an inhaler to take home.  Given prescription for prednisone for 4 days.  Discussed possibility approximate prescription, although also discussed interaction with prednisone as well as poor evidence for its use in a 36 year old with asthma, and after discussion decided to continue supportive care for his COVID-19, and appropriate treatment of his asthma with prednisone for 4 days.  Patient discharged in  stable condition with understanding of reasons to return.         Final Clinical Impression(s) / ED Diagnoses Final diagnoses:  Mild persistent asthma with exacerbation  COVID-19    Rx / DC Orders ED Discharge Orders          Ordered    predniSONE (DELTASONE) 10 MG tablet  Daily        12/15/21 0757              Alvira Monday, MD 12/15/21 [phone]

## 2021-12-15 NOTE — ED Triage Notes (Signed)
Pt arrives pov with c/o shob. Dx with covid x 2 days pta, hx of asthma. Pt ambulatory to room with pulse ox, HR 115-116, o2 98-99%

## 2021-12-28 ENCOUNTER — Other Ambulatory Visit: Payer: Self-pay | Admitting: Allergy

## 2021-12-28 DIAGNOSIS — J4540 Moderate persistent asthma, uncomplicated: Secondary | ICD-10-CM

## 2021-12-30 ENCOUNTER — Telehealth: Payer: Self-pay | Admitting: Family Medicine

## 2021-12-30 NOTE — Telephone Encounter (Signed)
Called patient. Patient was able to pick up inhaler from pharmacy.   Nothing further needed from our office.   Veronda Prude, RN

## 2021-12-30 NOTE — Telephone Encounter (Signed)
Attempted to call patient. It appears that Dr. Nelva Bush [facility] refilled [facility]er yesterday. Patient's phone went straight to VM and mailbox was full.   Talbot Grumbling, RN

## 2021-12-30 NOTE — Telephone Encounter (Signed)
Pt walked in stating he needs his Asthma Pump. Went to pharmacy who said no refills available . Problem with wheezing. Pt.ph # [phone]

## 2021-12-31 ENCOUNTER — Encounter: Payer: Self-pay | Admitting: Pulmonary Disease

## 2021-12-31 ENCOUNTER — Other Ambulatory Visit: Payer: Self-pay

## 2021-12-31 ENCOUNTER — Ambulatory Visit (INDEPENDENT_AMBULATORY_CARE_PROVIDER_SITE_OTHER): Payer: 59 | Admitting: Pulmonary Disease

## 2021-12-31 VITALS — BP 120/80 | HR 87 | Temp 98.4°F | Ht 74.0 in | Wt 280.0 lb

## 2021-12-31 DIAGNOSIS — R0683 Snoring: Secondary | ICD-10-CM

## 2021-12-31 NOTE — Patient Instructions (Signed)
Moderate probability of significant obstructive sleep apnea  We will schedule you for home sleep study Update you with results as soon as reviewed Tentative follow-up in 3 to 4 months  Continue weight loss efforts  Call with significant concerns  Sleep Apnea Sleep apnea is a condition in which breathing pauses or becomes shallow during sleep. People with sleep apnea usually snore loudly. They may have times when they gasp and stop breathing for 10 seconds or more during sleep. This may happen many times during the night. Sleep apnea disrupts your sleep and keeps your body from getting the rest that it needs. This condition can increase your risk of certain health problems, including: Heart attack. Stroke. Obesity. Type 2 diabetes. Heart failure. Irregular heartbeat. High blood pressure. The goal of treatment is to help you breathe normally again. What are the causes? The most common cause of sleep apnea is a collapsed or blocked airway. There are three kinds of sleep apnea: Obstructive sleep apnea. This kind is caused by a blocked or collapsed airway. Central sleep apnea. This kind happens when the part of the brain that controls breathing does not send the correct signals to the muscles that control breathing. Mixed sleep apnea. This is a combination of obstructive and central sleep apnea. What increases the risk? You are more likely to develop this condition if you: Are overweight. Smoke. Have a smaller than normal airway. Are older. Are male. Drink alcohol. Take sedatives or tranquilizers. Have a family history of sleep apnea. Have a tongue or tonsils that are larger than normal. What are the signs or symptoms? Symptoms of this condition include: Trouble staying asleep. Loud snoring. Morning headaches. Waking up gasping. Dry mouth or sore throat in the morning. Daytime sleepiness and tiredness. If you have daytime fatigue because of sleep apnea, you may be more likely  to have: Trouble concentrating. Forgetfulness. Irritability or mood swings. Personality changes. Feelings of depression. Sexual dysfunction. This may include loss of interest if you are male, or erectile dysfunction if you are male. How is this diagnosed? This condition may be diagnosed with: A medical history. A physical exam. A series of tests that are done while you are sleeping (sleep study). These tests are usually done in a sleep lab, but they may also be done at home. How is this treated? Treatment for this condition aims to restore normal breathing and to ease symptoms during sleep. It may involve managing health issues that can affect breathing, such as high blood pressure or obesity. Treatment may include: Sleeping on your side. Using a decongestant if you have nasal congestion. Avoiding the use of depressants, including alcohol, sedatives, and narcotics. Losing weight if you are overweight. Making changes to your diet. Quitting smoking. Using a device to open your airway while you sleep, such as: An oral appliance. This is a custom-made mouthpiece that shifts your lower jaw forward. A continuous positive airway pressure (CPAP) device. This device blows air through a mask when you breathe out (exhale). A nasal expiratory positive airway pressure (EPAP) device. This device has valves that you put into each nostril. A bi-level positive airway pressure (BIPAP) device. This device blows air through a mask when you breathe in (inhale) and breathe out (exhale). Having surgery if other treatments do not work. During surgery, excess tissue is removed to create a wider airway. Follow these instructions at home: Lifestyle Make any lifestyle changes that your health care provider recommends. Eat a healthy, well-balanced diet. Take steps to lose  weight if you are overweight. Avoid using depressants, including alcohol, sedatives, and narcotics. Do not use any products that contain  nicotine or tobacco. These products include cigarettes, chewing tobacco, and vaping devices, such as e-cigarettes. If you need help quitting, ask your health care provider. General instructions Take over-the-counter and prescription medicines only as told by your health care provider. If you were given a device to open your airway while you sleep, use it only as told by your health care provider. If you are having surgery, make sure to tell your health care provider you have sleep apnea. You may need to bring your device with you. Keep all follow-up visits. This is important. Contact a health care provider if: The device that you received to open your airway during sleep is uncomfortable or does not seem to be working. Your symptoms do not improve. Your symptoms get worse. Get help right away if: You develop: Chest pain. Shortness of breath. Discomfort in your back, arms, or stomach. You have: Trouble speaking. Weakness on one side of your body. Drooping in your face. These symptoms may represent a serious problem that is an emergency. Do not wait to see if the symptoms will go away. Get medical help right away. Call your local emergency services (911 in the [place] Do not drive yourself to the hospital. Summary Sleep apnea is a condition in which breathing pauses or becomes shallow during sleep. The most common cause is a collapsed or blocked airway. The goal of treatment is to restore normal breathing and to ease symptoms during sleep. This information is not intended to replace advice given to you by your health care provider. Make sure you discuss any questions you have with your health care provider. Document Revised: 07/08/2021 Document Reviewed: 11/07/2020 Elsevier Patient Education  2022 [employer].

## 2021-12-31 NOTE — Progress Notes (Signed)
RAKESH DUTKO    [id number]    Feb 28, 1986  Primary Care Physician:Lilland, Percival Spanish, DO  Referring Physician: Evelena Leyden, DO [address] [place],  [place] [address]  Chief complaint:   Snoring, witnessed apneas, daytime sleepiness  HPI:  Many years of snoring, daytime sleepiness, witnessed apneas Choking episodes Multiple awakenings Usually goes to bed between 9 and 10 Sometimes takes him a few hours to fall asleep 1-2 awakenings Final wake up time between 6 and 7  Weight is up about 30 pounds  Mom does have obstructive sleep apnea  History of hypertension, asthma, allergy and sinus problems  Admits to dryness of his mouth in the morning Occasional headaches Memory is good Occasional night sweats  Outpatient Encounter Medications as of 12/31/2021  Medication Sig   acetaminophen (TYLENOL) 500 MG tablet Take 1,000 mg by mouth every 8 (eight) hours as needed.   albuterol (VENTOLIN HFA) 108 (90 Base) MCG/ACT inhaler INHALE 1-2 PUFFS BY MOUTH EVERY 6 HOURS AS NEEDED FOR WHEEZE OR SHORTNESS OF BREATH   fluticasone (FLONASE) 50 MCG/ACT nasal spray Place 2 sprays into both nostrils in the morning and at bedtime.   fluticasone furoate-vilanterol (BREO ELLIPTA) 200-25 MCG/INH AEPB Inhale 1 puff into the lungs daily.   hydrochlorothiazide (HYDRODIURIL) 12.5 MG tablet Take 1 tablet (12.5 mg total) by mouth daily.   ibuprofen (ADVIL) 800 MG tablet Take 1 tablet (800 mg total) by mouth 3 (three) times daily.   NIFEdipine (PROCARDIA-XL/NIFEDICAL-XL) 30 MG 24 hr tablet Take 1 tablet (30 mg total) by mouth daily.   Spacer/Aero-Hold Chamber Bags MISC Use with inhaler   Cetirizine HCl 10 MG CAPS Take 1 capsule (10 mg total) by mouth daily for 10 days.   No facility-administered encounter medications on file as of 12/31/2021.    Allergies as of 12/31/2021   (No Known Allergies)    Past Medical History:  Diagnosis Date   Allergic rhinitis    Anxiety    Asthma     Condyloma 05/08/2020   Depression    Genital herpes 04/02/2011   Hypertension    Inadequate sleep hygiene 11/04/2020   Nodule of anus 07/30/2021   Pain in the abdomen 03/25/2021    Past Surgical History:  Procedure Laterality Date   None      Family History  Problem Relation Age of Onset   Depression Mother    Hypertension Mother    Allergic rhinitis Mother    Alcohol abuse Father    Drug abuse Father    Stroke Father    Hypertension Father    Allergic rhinitis Father    Alcohol abuse Paternal Uncle    Hypertension Brother    Angioedema Neg Hx    Asthma Neg Hx    Atopy Neg Hx    Eczema Neg Hx    Immunodeficiency Neg Hx    Urticaria Neg Hx     Social History   Socioeconomic History   Marital status: Single    Spouse name: Not on file   Number of children: Not on file   Years of education: Not on file   Highest education level: Not on file  Occupational History   Occupation: [employer]OF    Comment: Mortgages  Tobacco Use   Smoking status: Never   Smokeless tobacco: Never  Vaping Use   Vaping Use: Never used  Substance and Sexual Activity   Alcohol use: No   Drug use: No   Sexual  activity: Not on file  Other Topics Concern   Not on file  Social History Narrative   Lives alone.     Social Determinants of Health   Financial Resource Strain: Not on file  Food Insecurity: Not on file  Transportation Needs: Not on file  Physical Activity: Not on file  Stress: Not on file  Social Connections: Not on file  Intimate Partner Violence: Not on file    Review of Systems  Constitutional:  Positive for fatigue.  Psychiatric/Behavioral:  Positive for sleep disturbance.    Vitals:   12/31/21 0941  BP: 120/80  Pulse: 87  Temp: 98.4 F (36.9 C)  SpO2: 100%     Physical Exam Constitutional:      Appearance: He is obese.  HENT:     Head: Normocephalic.     Mouth/Throat:     Mouth: Mucous membranes are moist.     Comments: Mallampati 3, crowded oropharynx,  macroglossia Eyes:     Pupils: Pupils are equal, round, and reactive to light.  Cardiovascular:     Rate and Rhythm: Normal rate and regular rhythm.     Heart sounds: No murmur heard.   No friction rub.  Pulmonary:     Effort: No respiratory distress.     Breath sounds: No stridor. No wheezing or rhonchi.  Musculoskeletal:     Cervical back: No rigidity or tenderness.  Neurological:     Mental Status: He is alert.  Psychiatric:        Mood and Affect: Mood normal.   Results of the Epworth flowsheet 12/31/2021  Sitting and reading 1  Watching TV 3  Sitting, inactive in a public place (e.g. a theatre or a meeting) 2  As a passenger in a car for an hour without a break 1  Lying down to rest in the afternoon when circumstances permit 2  Sitting and talking to someone 1  Sitting quietly after a lunch without alcohol 2  In a car, while stopped for a few minutes in traffic 1  Total score 13   Data Reviewed: No previous studies  Assessment:  Moderate probability of significant obstructive sleep apnea  Excessive daytime sleepiness  Thick neck greater than 40 cm, Epworth we will 13, male, crowded oropharynx with Mallampati 3 all places him at increased risk of sleep disordered breathing  Graded exercises with weight loss efforts encouraged  Plan/Recommendations: Encouraged to continue exercise on a regular basis  We will schedule patient for a home sleep study  Importance of treating sleep disordered breathing discussed, risks with not treating sleep disordered breathing discussed  Tentative follow-up in 3 to 4 months  Encouraged to call with any significant concerns   Virl Diamond MD [facility] 12/31/2021, 9:46 AM  CC: Lilland, Alana, DO

## 2022-02-18 ENCOUNTER — Encounter: Payer: Self-pay | Admitting: Pulmonary Disease

## 2022-04-14 ENCOUNTER — Ambulatory Visit (INDEPENDENT_AMBULATORY_CARE_PROVIDER_SITE_OTHER): Payer: 59 | Admitting: Family Medicine

## 2022-04-14 VITALS — BP 132/78 | HR 77 | Ht 74.0 in | Wt 296.8 lb

## 2022-04-14 DIAGNOSIS — H6982 Other specified disorders of Eustachian tube, left ear: Secondary | ICD-10-CM | POA: Diagnosis not present

## 2022-04-14 MED ORDER — OXYMETAZOLINE HCL 0.05 % NA SOLN: 1.0000 | Freq: Two times a day (BID) | NASAL | 0 refills | Status: AC

## 2022-04-14 MED ORDER — CETIRIZINE HCL 10 MG PO CAPS: 10.0000 mg | ORAL_CAPSULE | Freq: Every day | ORAL | 0 refills | Status: DC

## 2022-04-14 NOTE — Patient Instructions (Addendum)
Thank you for coming to see me today. It was a pleasure.  ? ?Stop Flonase and Allegra  ?Start  Afrin nasal spray twice a day for 3 days, then resume Flonase 2 spays once a day ?Start Cetirizine 10 mg daily ? ? ?Please follow-up with PCP as needed ? ?If you have any questions or concerns, please do not hesitate to call the office at [phone]. ? ?Best,  ? ?Dana Allan, MD   ?

## 2022-04-14 NOTE — Progress Notes (Signed)
? ? ?  SUBJECTIVE:  ? ?CHIEF COMPLAINT / HPI: left ear clogged ? ?Patient reports having sinuses issues and feels like left ear is clogged.  Started 2 days ago.  Has been taking Flonase 2 spray twice a day and Allegra daily for 2-3 years.  Denies any headaches, cough, fevers, decrease in hearing, tinnitus, dizziness, pain or ear discharge.  Endorses runny nose and feeling of fluid in left ear. ? ?PERTINENT  PMH / PSH:  ?HTN ?Asthma ?Allergic Rhinitis ?Anxiety ? ? ?OBJECTIVE:  ? ?BP 132/78   Pulse 77   Ht 6\' 2"  (1.88 m)   Wt 296 lb 12.8 oz (134.6 kg)   SpO2 98%   BMI 38.11 kg/m?   ? ?General: Alert, no acute distress ?HEENT: TM's visible bilaterally without bulging, effusion, erythema or discharge ?Cardio: Normal S1 and S2, RRR, no r/m/g ?Pulm: CTAB, normal work of breathing ? ?ASSESSMENT/PLAN:  ? ?Eustachian tube dysfunction, left ?Do not think infectious process given no pain, drainage, or fevers.  Likely environmental allergies contributing to increase in nasal secretions and feeling of fullness in ear. ?Stop Allegra ?Start Cetirizine 10 mg daily ?Start Afrin nasal spray 1 spray to each nostril twice a day for three days.  Instructed patient to stop use after third day ?Resume Flonase after Afrin treatment but decrease to 2 sprays daily, max dosing. ?Follow up with PCP in 1-2 weeks if no improvement in symptoms ?Strict return precautions provided ?  ? ? ? , MD ?[facility]  ?

## 2022-04-17 ENCOUNTER — Encounter: Payer: Self-pay | Admitting: Family Medicine

## 2022-04-17 DIAGNOSIS — H6982 Other specified disorders of Eustachian tube, left ear: Secondary | ICD-10-CM | POA: Insufficient documentation

## 2022-04-17 MED ORDER — FLUTICASONE PROPIONATE 50 MCG/ACT NA SUSP: 1.0000 | Freq: Two times a day (BID) | NASAL | 11 refills | Status: DC

## 2022-04-17 NOTE — Assessment & Plan Note (Signed)
Do not think infectious process given no pain, drainage, or fevers.  Likely environmental allergies contributing to increase in nasal secretions and feeling of fullness in ear. ?Stop Allegra ?Start Cetirizine 10 mg daily ?Start Afrin nasal spray 1 spray to each nostril twice a day for three days.  Instructed patient to stop use after third day ?Resume Flonase after Afrin treatment but decrease to 2 sprays daily, max dosing. ?Follow up with PCP in 1-2 weeks if no improvement in symptoms ?Strict return precautions provided ?

## 2022-04-28 ENCOUNTER — Other Ambulatory Visit: Payer: Self-pay | Admitting: Allergy

## 2022-04-28 DIAGNOSIS — J4540 Moderate persistent asthma, uncomplicated: Secondary | ICD-10-CM

## 2022-05-06 ENCOUNTER — Ambulatory Visit (INDEPENDENT_AMBULATORY_CARE_PROVIDER_SITE_OTHER): Payer: 59

## 2022-05-06 ENCOUNTER — Ambulatory Visit (INDEPENDENT_AMBULATORY_CARE_PROVIDER_SITE_OTHER): Payer: 59 | Admitting: Family Medicine

## 2022-05-06 VITALS — BP 135/92 | HR 72 | Ht 74.0 in | Wt 290.2 lb

## 2022-05-06 DIAGNOSIS — I10 Essential (primary) hypertension: Secondary | ICD-10-CM

## 2022-05-06 DIAGNOSIS — R21 Rash and other nonspecific skin eruption: Secondary | ICD-10-CM | POA: Insufficient documentation

## 2022-05-06 DIAGNOSIS — Z23 Encounter for immunization: Secondary | ICD-10-CM

## 2022-05-06 DIAGNOSIS — Q383 Other congenital malformations of tongue: Secondary | ICD-10-CM | POA: Insufficient documentation

## 2022-05-06 LAB — POCT SKIN KOH: Skin KOH, POC: NEGATIVE

## 2022-05-06 MED ORDER — KETOCONAZOLE 2 % EX CREA: 1.0000 "application " | TOPICAL_CREAM | Freq: Two times a day (BID) | CUTANEOUS | 0 refills | Status: AC

## 2022-05-06 NOTE — Assessment & Plan Note (Signed)
Pruritic rash on left axilla, etiology unclear.  DDx includes tinea versicolor vs contact dermatitis vs nummular dermatitis.  Overall very broad differential, lesion does not fit any 1 particular pattern.  Would expect if it was contact dermatitis he would have known contact and it would be less likely to be axilla.  A drug eruption would be more diffusely spread.  Given time of year and location, suspect fungal etiology. KOH negative, which doesn't eliminate fungal etiology. Recommend trial of ketoconazole cream 2% twice daily x2 weeks.  If no improvement, can consider biopsy, but at that point would recommend treatment with gentle steroid such as triamcinolone.

## 2022-05-06 NOTE — Progress Notes (Signed)
    SUBJECTIVE:   CHIEF COMPLAINT / HPI:   Rash: patient has noticed a small rash on his left axillae that has been present for about a month. Mildly prurutic, he has tried OTC hydrocortisone on it without relief.  He denies any changes in personal care products.  He started nifedipine for blood pressure about 2 months ago.  He does not have any other exposures that he can think of.  Tongue numbness: Patient reports that the left tip of his tongue has been slightly numb, he reports it feels like "when the dentist numbs you for procedure."  He has not bitten his tongue, has not had any changes that he can think of, no recent URI symptoms, fevers, chills, runny nose, cough.  This is been going on for a week or so.  He notices a difference when he brushes his tongue.  Hypertension: Patient has not mild hypertension today 135/92.  He takes nifedipine and HCTZ.  He reports he has not taken his HCTZ yet today.  Asymptomatic.  PERTINENT  PMH / PSH: hypertension  OBJECTIVE:   BP (!) 135/92   Pulse 72   Ht 6\' 2"  (1.88 m)   Wt 290 lb 4 oz (131.7 kg)   SpO2 100%   BMI 37.27 kg/m   Nursing note and vitals reviewed GEN: Age-appropriate, AA W, resting comfortably in chair, NAD, class II obesity HEENT: NCAT. PERRLA. Sclera without injection or icterus. MMM.  Clear oropharynx, gumline nonerythematous, not indurated, tongue appears normal with normal papilla.  Caries on multiple teeth on maxillary jaw. Neck: Supple.  No LAD Cardiac: Regular rate and rhythm. Normal S1/S2. No murmurs, rubs, or gallops appreciated. 2+ radial pulses. Lungs: Clear bilaterally to ascultation. No increased WOB, no accessory muscle usage. No w/r/r. Neuro: AOx3  Cranial Nerves II: PERRL.  III,IV, VI: EOMI without ptosis or diplopia.  V: Facial sensation is symmetric to touch VII: Facial movement is symmetric.  VIII: Hearing is intact to voice X: Palate elevates symmetrically XI: Shoulder shrug is symmetric. XII: Tongue  is midline without atrophy or fasciculations.  Motor: Tone is normal. Bulk is normal. 5/5 strength was present in all four extremities.  Sensory: Sensation is symmetric to light touch in the arms and legs. Deep Tendon Reflexes: 2+ and symmetric in the biceps and patellae.  Ext: no edema Psych: Pleasant and appropriate    ASSESSMENT/PLAN:   ESSENTIAL HYPERTENSION, BENIGN Recommend patient take his daily medication and take blood pressure at home, follow up with PCP.  Rash Pruritic rash on left axilla, etiology unclear.  DDx includes tinea versicolor vs contact dermatitis vs nummular dermatitis.  Overall very broad differential, lesion does not fit any 1 particular pattern.  Would expect if it was contact dermatitis he would have known contact and it would be less likely to be axilla.  A drug eruption would be more diffusely spread.  Given time of year and location, suspect fungal etiology. KOH negative, which doesn't eliminate fungal etiology. Recommend trial of ketoconazole cream 2% twice daily x2 weeks.  If no improvement, can consider biopsy, but at that point would recommend treatment with gentle steroid such as triamcinolone.  Tongue anomaly Patient reports numbness a small section of the left tip of his tongue.  On exam tongue is normal, no obvious infection, annual neurologic exam completely normal which is reassuring.  Recommend patient follow-up with dentist.     , MD [facility]

## 2022-05-06 NOTE — Assessment & Plan Note (Signed)
Patient reports numbness a small section of the left tip of his tongue.  On exam tongue is normal, no obvious infection, annual neurologic exam completely normal which is reassuring.  Recommend patient follow-up with dentist.

## 2022-05-06 NOTE — Patient Instructions (Signed)
It was a pleasure to see you today!  For the rash: This is most likely a fungus. Use ketoconazole cream 2% twice a day for 2 weeks. If no improvement, I recommend returning for follow up and we can try a different medication. For your tongue: reassuringly, your cranial nerve exam is normal and there are no obvious problems on your tongue. I recommend you follow up with your dentist.  Be Well,  Dr. Leary Roca

## 2022-05-06 NOTE — Assessment & Plan Note (Signed)
Recommend patient take his daily medication and take blood pressure at home, follow up with PCP.

## 2022-05-20 ENCOUNTER — Other Ambulatory Visit: Payer: Self-pay | Admitting: Allergy

## 2022-05-20 DIAGNOSIS — J4540 Moderate persistent asthma, uncomplicated: Secondary | ICD-10-CM

## 2022-06-03 ENCOUNTER — Telehealth: Payer: Self-pay

## 2022-06-03 ENCOUNTER — Telehealth: Payer: Self-pay | Admitting: Allergy

## 2022-06-03 DIAGNOSIS — J4540 Moderate persistent asthma, uncomplicated: Secondary | ICD-10-CM

## 2022-06-03 MED ORDER — ALBUTEROL SULFATE HFA 108 (90 BASE) MCG/ACT IN AERS: INHALATION_SPRAY | RESPIRATORY_TRACT | 1 refills | Status: DC

## 2022-06-03 MED ORDER — ALBUTEROL SULFATE HFA 108 (90 BASE) MCG/ACT IN AERS: INHALATION_SPRAY | RESPIRATORY_TRACT | 0 refills | Status: DC

## 2022-06-03 NOTE — Telephone Encounter (Signed)
Refill sent to [facility] [address]. Tried to inform patient of the courtesy refill sent into the pharmacy but his voicemail is full so I couldn't leave a message.  Zylen [phone]

## 2022-06-03 NOTE — Telephone Encounter (Signed)
Refill has been sent in.  

## 2022-06-03 NOTE — Telephone Encounter (Signed)
Patient calls nurse line requesting a refill on albuterol inhaler.   Patient reports he scheduled an apt with allergy, however apt is not until 7/27.  They are deferring medication to PCP.   Please advise.

## 2022-06-03 NOTE — Telephone Encounter (Signed)
Patient called and made appointment for 07/08/2022 with dr Delorse Lek. He needs rx for albuterol [facility] [address] [phone]

## 2022-06-06 ENCOUNTER — Other Ambulatory Visit: Payer: Self-pay | Admitting: Family Medicine

## 2022-07-08 ENCOUNTER — Ambulatory Visit: Payer: Self-pay | Admitting: Allergy

## 2022-07-08 ENCOUNTER — Encounter: Payer: Self-pay | Admitting: Allergy

## 2022-07-08 ENCOUNTER — Ambulatory Visit (INDEPENDENT_AMBULATORY_CARE_PROVIDER_SITE_OTHER): Payer: 59 | Admitting: Allergy

## 2022-07-08 VITALS — BP 122/80 | HR 80 | Temp 98.4°F | Resp 20 | Wt 281.0 lb

## 2022-07-08 DIAGNOSIS — J3089 Other allergic rhinitis: Secondary | ICD-10-CM

## 2022-07-08 DIAGNOSIS — H1013 Acute atopic conjunctivitis, bilateral: Secondary | ICD-10-CM | POA: Diagnosis not present

## 2022-07-08 DIAGNOSIS — J4540 Moderate persistent asthma, uncomplicated: Secondary | ICD-10-CM | POA: Diagnosis not present

## 2022-07-08 MED ORDER — OLOPATADINE HCL 0.2 % OP SOLN: 1.0000 [drp] | Freq: Every day | OPHTHALMIC | 5 refills | Status: AC

## 2022-07-08 MED ORDER — ALBUTEROL SULFATE HFA 108 (90 BASE) MCG/ACT IN AERS
INHALATION_SPRAY | RESPIRATORY_TRACT | 1 refills | Status: DC
Start: 2022-07-08 — End: 2023-08-16

## 2022-07-08 MED ORDER — RYALTRIS 665-25 MCG/ACT NA SUSP: 2.0000 | Freq: Two times a day (BID) | NASAL | 5 refills | Status: AC

## 2022-07-08 MED ORDER — FLUTICASONE FUROATE-VILANTEROL 200-25 MCG/ACT IN AEPB: 1.0000 | INHALATION_SPRAY | Freq: Every day | RESPIRATORY_TRACT | 5 refills | Status: AC

## 2022-07-08 NOTE — Progress Notes (Signed)
Follow-up Note  RE: Scott CapesRemus L Patterson MRN: [id number] DOB: May 04, 1986 Date of Office Visit: 07/08/2022   History of present illness: Scott Patterson is a [age] y.o. male presenting today for follow-up of asthma and allergic rhinitis with conjunctivitis.  He was last seen in the office on 04/23/20 by myself.  He has not had any major health changes, surgeries or hospitalizations since last visit.    He states his asthma has been flaring with activity and the heat which are big triggers for him.  He states he was walking outside which has stopped due to it driving his symptoms.  He reports wheezing primarily.  He reports albuterol use at least 4-5 times a week during the warmer months.  He does not use albuterol prior to activity.  He has Breo 200mcg but only using it about 3 days a week.    He denies having any ED/UC visits this summer nor has he had any systemic steroids.   He had covid illness last year and it flared his asthma.  He did go to ED for symptoms but did not require hospitalization.     He reports allergy symptoms of sneezing, nasal drainage, itchy eyes that are year-round.He was changed from zyrtec to allegra that he takes daily and states is helpful.  He does use flonase when needed to his nasal symptoms.  He has bought OTC allergy drops in the past as well.    Review of systems: Review of Systems  Constitutional: Negative.   HENT:  Positive for congestion, postnasal drip and sneezing.   Eyes:  Positive for itching.  Respiratory:  Positive for wheezing.   Cardiovascular: Negative.   Musculoskeletal: Negative.   Skin: Negative.   Allergic/Immunologic: Negative.   Neurological: Negative.      All other systems negative unless noted above in HPI  Past medical/social/surgical/family history have been reviewed and are unchanged unless specifically indicated below.  No changes  Medication List: Current Outpatient Medications  Medication Sig Dispense Refill   acetaminophen  (TYLENOL) 500 MG tablet Take 1,000 mg by mouth every 8 (eight) hours as needed.     albuterol (VENTOLIN HFA) 108 (90 Base) MCG/ACT inhaler INHALE 1-2 PUFFS BY MOUTH EVERY 6 HOURS AS NEEDED FOR WHEEZE OR SHORTNESS OF BREATH 18 g 0   cetirizine (ZYRTEC) 10 MG tablet Take 10 mg by mouth daily.     fluticasone (FLONASE) 50 MCG/ACT nasal spray Place 1 spray into both nostrils in the morning and at bedtime. 16 g 11   hydrochlorothiazide (HYDRODIURIL) 12.5 MG tablet TAKE 1 TABLET BY MOUTH EVERY DAY 90 tablet 2   ibuprofen (ADVIL) 800 MG tablet Take 1 tablet (800 mg total) by mouth 3 (three) times daily. 21 tablet 0   ketoconazole (NIZORAL) 2 % cream Apply 1 application. topically 2 (two) times daily. For 2 weeks. 15 g 0   NIFEdipine (PROCARDIA-XL/NIFEDICAL-XL) 30 MG 24 hr tablet Take 1 tablet (30 mg total) by mouth daily. 90 tablet 3   oxymetazoline (AFRIN NASAL SPRAY) 0.05 % nasal spray Place 1 spray into both nostrils 2 (two) times daily. 15 mL 0   Spacer/Aero-Hold Chamber Bags MISC Use with inhaler 1 each 1   No current facility-administered medications for this visit.     Known medication allergies: No Known Allergies   Physical examination: Blood pressure 122/80, pulse 80, temperature 98.4 F (36.9 C), temperature source Temporal, resp. rate 20, weight 281 lb (127.5 kg), SpO2 97 %.  General: Alert,  interactive, in no acute distress. HEENT: PERRLA, TMs pearly gray, turbinates moderately edematous without discharge, post-pharynx non erythematous. Neck: Supple without lymphadenopathy. Lungs: Clear to auscultation without wheezing, rhonchi or rales. {no increased work of breathing. CV: Normal S1, S2 without murmurs. Abdomen: Nondistended, nontender. Skin: Warm and dry, without lesions or rashes. Extremities:  No clubbing, cyanosis or edema. Neuro:   Grossly intact.  Diagnositics/Labs: Component     Latest Ref Rng 04/23/2020  D Pteronyssinus IgE     Class IV kU/L 6.88 !   D Farinae IgE      Class IV kU/L 5.73 !   Cat Dander IgE     Class II kU/L 0.68 !   Dog Dander IgE     Class 0/I kU/L 0.20 !   [place] Grass IgE     Class II kU/L 0.79 !   Timothy Grass IgE     Class II kU/L 1.30 !   Johnson Grass IgE     Class II kU/L 0.86 !   B[place]ia Grass IgE     Class II kU/L 1.10 !   Cockroach, American IgE     Class I kU/L 0.45 !   Penicillium Chrysogen IgE     Class III kU/L 1.79 !   Cladosporium Herbarum IgE     Class III kU/L 2.08 !   Aspergillus Fumigatus IgE     Class III kU/L 2.43 !   Mucor Racemosus IgE     Class II kU/L 0.96 !   Alternaria Alternata IgE     Class III kU/L 3.49 !   Stemphylium Herbarum IgE     Class III kU/L 1.68 !   Common Silver Charletta Cousin IgE     Class IV kU/L 9.69 !   Oak, [place] IgE     Class IV kU/L 10.90 !   Elm, American IgE     Class III kU/L 1.66 !   Maple/Box Elder IgE     Class II kU/L 1.02 !   Hickory, White IgE     Class IV kU/L 4.34 !   Amer Sycamore IgE Qn     Class II kU/L 1.06 !   White Mulberry IgE     Class 0/I kU/L 0.29 !   Sweet gum IgE RAST Ql     Class III kU/L 2.65 !   [place], [place] IgE     Class II kU/L 0.91 !   Ragweed, Short IgE     Class II kU/L 1.02 !   Mugwort IgE Qn     Class I kU/L 0.54 !   Plantain, English IgE     Class II kU/L 0.76 !   Pigweed, Rough IgE     Class II kU/L 0.73 !   Sheep Sorrel IgE Qn     Class I kU/L 0.54 !   Nettle IgE     Class II kU/L 0.88 !      Spirometry: FEV1: 4.14L 100%, FVC: 4.42L 87%, ratio consistent with nonobstructive pattern  Assessment and plan: Asthma, moderate persistent - not under good control at this time - lung function testing is normal today - have access to albuterol inhaler 2 puffs every 4-6 hours as needed for cough/wheeze/shortness of breath/chest tightness.  Use 15-20 minutes prior to activity.   Monitor frequency of use.   - continue Breo 1 puff daily.  Need to be consistent with daily use for it to provide control.   Asthma control  goals:  Full  participation in all desired activities (may need albuterol before activity) Albuterol use two time or less a week on average (not counting use with activity) Cough interfering with sleep two time or less a month Oral steroids no more than once a year No hospitalizations  Allergic rhinitis with conjunctivitis  - environmental allergy testing was positive to tree pollen, grass pollen, weed pollen, molds, dust mite, dog, cat, cockroach  - continue Allegra 180mg  daily  - stop Flonase and trial Ryaltris 2 spray each nostril twice a day.  This is a combination nasal spray with steroid (mometasone) + nasal antihistamine (olopatadine).  This helps with both nasal congestion and drainage.   - recommend performing nasal saline rinses.  Use distilled water or boil water and cool to room temperature before use.  Breathe thru mouth during the rinse.    After performing rinse then use your medicated nasal spray.    - use Olopatadine 0.2% 1 drop each eye daily as needed for itchy, watery eyes - consider allergy shots as a means of long-term control if medication management is not effective.  Allergy shots "re-train" and "reset" the immune system to ignore environmental allergens and decrease the resulting immune response to those allergens (sneezing, itchy watery eyes, runny nose, nasal congestion, etc).    Allergy shots improve symptoms in 80-85% of patients.   Follow-up 4-6 months or sooner if needed  I appreciate the opportunity to take part in Scott Patterson's care. Please do not hesitate to contact me with questions.  Sincerely,   10-03-1987, MD Allergy/Immunology [facility]

## 2022-07-08 NOTE — Patient Instructions (Addendum)
Asthma - not under good control at this time - lung function testing is normal today - have access to albuterol inhaler 2 puffs every 4-6 hours as needed for cough/wheeze/shortness of breath/chest tightness.  Use 15-20 minutes prior to activity.   Monitor frequency of use.   - continue Breo 1 puff daily.  Need to be consistent with daily use for it to provide control.   Asthma control goals:  Full participation in all desired activities (may need albuterol before activity) Albuterol use two time or less a week on average (not counting use with activity) Cough interfering with sleep two time or less a month Oral steroids no more than once a year No hospitalizations  Allergies  - environmental allergy testing was positive to tree pollen, grass pollen, weed pollen, molds, dust mite, dog, cat, cockroach  - continue Allegra 180mg  daily  - stop Flonase and trial Ryaltris 2 spray each nostril twice a day.  This is a combination nasal spray with steroid (mometasone) + nasal antihistamine (olopatadine).  This helps with both nasal congestion and drainage.   - recommend performing nasal saline rinses.  Use distilled water or boil water and cool to room temperature before use.  Breathe thru mouth during the rinse.    After performing rinse then use your medicated nasal spray.    - use Olopatadine 0.2% 1 drop each eye daily as needed for itchy, watery eyes - consider allergy shots as a means of long-term control if medication management is not effective.  Allergy shots "re-train" and "reset" the immune system to ignore environmental allergens and decrease the resulting immune response to those allergens (sneezing, itchy watery eyes, runny nose, nasal congestion, etc).    Allergy shots improve symptoms in 80-85% of patients.   Follow-up 4-6 months or sooner if needed

## 2022-07-26 ENCOUNTER — Encounter: Payer: 59 | Admitting: Family Medicine

## 2022-08-21 ENCOUNTER — Encounter (HOSPITAL_BASED_OUTPATIENT_CLINIC_OR_DEPARTMENT_OTHER): Payer: Self-pay

## 2022-08-21 ENCOUNTER — Emergency Department (HOSPITAL_BASED_OUTPATIENT_CLINIC_OR_DEPARTMENT_OTHER)
Admission: EM | Admit: 2022-08-21 | Discharge: 2022-08-21 | Disposition: A | Discharge status: Home / Self Care | Payer: Commercial Managed Care - HMO | Attending: Emergency Medicine | Admitting: Emergency Medicine

## 2022-08-21 ENCOUNTER — Other Ambulatory Visit: Payer: Self-pay

## 2022-08-21 DIAGNOSIS — R109 Unspecified abdominal pain: Secondary | ICD-10-CM | POA: Diagnosis present

## 2022-08-21 LAB — URINALYSIS, ROUTINE W REFLEX MICROSCOPIC
Bilirubin Urine: NEGATIVE
Glucose, UA: NEGATIVE mg/dL
Hgb urine dipstick: NEGATIVE
Ketones, ur: NEGATIVE mg/dL
Leukocytes,Ua: NEGATIVE
Nitrite: NEGATIVE
Specific Gravity, Urine: 1.033 — ABNORMAL HIGH (ref 1.005–1.030)
pH: 6.5 (ref 5.0–8.0)

## 2022-08-21 LAB — CBC WITH DIFFERENTIAL/PLATELET
Abs Immature Granulocytes: 0.01 10*3/uL (ref 0.00–0.07)
Basophils Absolute: 0 10*3/uL (ref 0.0–0.1)
Basophils Relative: 0 %
Eosinophils Absolute: 0 10*3/uL (ref 0.0–0.5)
Eosinophils Relative: 1 %
HCT: 44.7 % (ref 39.0–52.0)
Hemoglobin: 14.5 g/dL (ref 13.0–17.0)
Immature Granulocytes: 0 %
Lymphocytes Relative: 25 %
Lymphs Abs: 1.1 10*3/uL (ref 0.7–4.0)
MCH: 25.4 pg — ABNORMAL LOW (ref 26.0–34.0)
MCHC: 32.4 g/dL (ref 30.0–36.0)
MCV: 78.4 fL — ABNORMAL LOW (ref 80.0–100.0)
Monocytes Absolute: 0.5 10*3/uL (ref 0.1–1.0)
Monocytes Relative: 11 %
Neutro Abs: 2.6 10*3/uL (ref 1.7–7.7)
Neutrophils Relative %: 63 %
Platelets: 172 10*3/uL (ref 150–400)
RBC: 5.7 MIL/uL (ref 4.22–5.81)
RDW: 15.9 % — ABNORMAL HIGH (ref 11.5–15.5)
WBC: 4.2 10*3/uL (ref 4.0–10.5)
nRBC: 0 % (ref 0.0–0.2)

## 2022-08-21 LAB — BASIC METABOLIC PANEL
Anion gap: 8 (ref 5–15)
BUN: 19 mg/dL (ref 6–20)
CO2: 29 mmol/L (ref 22–32)
Calcium: 10.2 mg/dL (ref 8.9–10.3)
Chloride: 101 mmol/L (ref 98–111)
Creatinine, Ser: 1.48 mg/dL — ABNORMAL HIGH (ref 0.61–1.24)
GFR, Estimated: >60 mL/min (ref >60)
Glucose, Bld: 80 mg/dL (ref 70–99)
Potassium: 4.1 mmol/L (ref 3.5–5.1)
Sodium: 138 mmol/L (ref 135–145)

## 2022-08-21 MED ORDER — ACETAMINOPHEN 500 MG PO TABS
1000.0000 mg | ORAL_TABLET | Freq: Once | ORAL | Status: AC
  Administered 2022-08-21: 1000 mg via ORAL
  Filled 2022-08-21: qty 2

## 2022-08-21 NOTE — ED Triage Notes (Addendum)
Pt c/o left sided flank cramping in flank x 1.5 weeks. Pt states pain is worse in morning, but throbs throughout days. Denies N/V/D. Pt denies any urinary issues. Pt states he works out, but no new activities.

## 2022-08-21 NOTE — ED Provider Notes (Signed)
[facility] EMERGENCY DEPT Provider Note   CSN: [id number] Arrival date & time: 08/21/22  1307     History Chief Complaint  Patient presents with   Flank Pain   HPI Scott Patterson is a 36 y.o. male presenting for left-sided flank pain.  He endorses that he has had approximately 3 days of left-sided flank pain.  He denies fevers or chills nausea vomiting syncope shortness of breath.  Otherwise ambulatory tolerating p.o. intake.  He states he has a family history of nephrolithiasis and is worried that he is having his first kidney stone.  Otherwise no known sick contacts.   Patient's recorded medical, surgical, social, medication list and allergies were reviewed in the Snapshot window as part of the initial history.   Review of Systems   Review of Systems  Constitutional:  Negative for chills and fever.  HENT:  Negative for ear pain and sore throat.   Eyes:  Negative for pain and visual disturbance.  Respiratory:  Negative for cough and shortness of breath.   Cardiovascular:  Negative for chest pain and palpitations.  Gastrointestinal:  Negative for abdominal pain and vomiting.  Genitourinary:  Positive for flank pain. Negative for dysuria and hematuria.  Musculoskeletal:  Negative for arthralgias and back pain.  Skin:  Negative for color change and rash.  Neurological:  Negative for seizures and syncope.  All other systems reviewed and are negative.   Physical Exam Updated Vital Signs BP 129/89   Pulse 78   Temp 98.6 F (37 C) (Oral)   Resp 16   Ht 6\' 2"  (1.88 m)   Wt 122.5 kg   SpO2 98%   BMI 34.67 kg/m  Physical Exam Vitals and nursing note reviewed.  Constitutional:      General: He is not in acute distress.    Appearance: He is well-developed.  HENT:     Head: Normocephalic and atraumatic.  Eyes:     Conjunctiva/sclera: Conjunctivae normal.  Cardiovascular:     Rate and Rhythm: Normal rate and regular rhythm.     Heart sounds: No murmur  heard. Pulmonary:     Effort: Pulmonary effort is normal. No respiratory distress.     Breath sounds: Normal breath sounds.  Abdominal:     Palpations: Abdomen is soft.     Tenderness: There is no abdominal tenderness. There is left CVA tenderness. There is no right CVA tenderness.  Musculoskeletal:        General: No swelling.     Cervical back: Neck supple.  Skin:    General: Skin is warm and dry.     Capillary Refill: Capillary refill takes less than 2 seconds.  Neurological:     Mental Status: He is alert.  Psychiatric:        Mood and Affect: Mood normal.      ED Course/ Medical Decision Making/ A&P    Procedures Procedures   Medications Ordered in ED Medications  acetaminophen (TYLENOL) tablet 1,000 mg (1,000 mg Oral Given 08/21/22 1412)   Medical Decision Making:   Scott Patterson is a 36 y.o. male who presented to the ED today with abdominal pain, detailed above.    Patient's presentation is complicated by their history of elevated BMI.  Patient placed on continuous vitals and telemetry monitoring while in ED which was reviewed periodically.  Complete initial physical exam performed, notably the patient  was HDS in NAD.     Reviewed and confirmed nursing documentation for past medical history,  family history, social history.    Initial Assessment:   Patient's history of present on this visit and findings are most consistent with nonspecific musculoskeletal etiology.  Consider nephrolithiasis or pyelonephritis, however patient is afebrile well-appearing. Shared medical decision making with patient regarding cross-sectional imaging versus laboratory evaluation.  Patient requested laboratory evaluation with plan to progress as needed.  We will proceed with screening labs like CBC, CMP, urinalysis we will plan for reassessment  Reassessment: On reassessment, patient had no evidence of acute pathology.  His kidney function is near his baseline, no evidence of urinary tract  disease at this time.  Recommended close follow-up with primary care provider in the outpatient setting given his concern for possibly developing CKD but otherwise no acute indication for intervention at this time.  Patient discharged with no further acute events. Clinical Impression:  1. Flank pain      Discharge   Final Clinical Impression(s) / ED Diagnoses Final diagnoses:  Flank pain    Rx / DC Orders ED Discharge Orders     None         Glyn Ade, MD 08/21/22 1521

## 2022-09-15 ENCOUNTER — Other Ambulatory Visit: Payer: Self-pay | Admitting: Family Medicine

## 2022-10-08 ENCOUNTER — Ambulatory Visit (INDEPENDENT_AMBULATORY_CARE_PROVIDER_SITE_OTHER): Payer: Self-pay | Admitting: Family Medicine

## 2022-10-08 VITALS — BP 124/89 | HR 84 | Ht 74.0 in | Wt 277.0 lb

## 2022-10-08 DIAGNOSIS — I10 Essential (primary) hypertension: Secondary | ICD-10-CM

## 2022-10-08 NOTE — Patient Instructions (Signed)
For your hemorrhoid: - Continue using Preparation H if you have any inflammation - This can take several weeks to months to go away - I would recommend doing something like Metamucil daily to help make sure that your stools are soft and you are not straining  Toe swelling and pain - I would recommend going up to a 14 wide and seeing if that bigger toe box helps that pain. - Go ahead and get compression stockings and wear them and see if that helps at all - If none of that helps, I would look up the different ways that you can tie her laces so that we can decrease the amount of tension that is on your midfoot and that may help with decreasing the numbness and swelling or getting - If you have any worsening or fail to get not getting better over the next couple weeks just let me know.

## 2022-10-08 NOTE — Assessment & Plan Note (Signed)
Stable today at 124/89, currently on HCTZ 12.5mg  daily and nifedipine 30mg  daily.  - no changes in management today

## 2022-10-08 NOTE — Progress Notes (Signed)
    SUBJECTIVE:   CHIEF COMPLAINT / HPI:   Hemorrhoid  - Had treatment with Preparation H, had improvement in symptoms - Still has a small bump present that bothers him - No current pain  Toes swelling/pain - Has been occurring since May - No clear etiology - Has this swelling followed by pain in all of his toes - Bought several new shoes and has tried a larger size without much improvement - Currently wearing slides due to the swelling and pain - Still able to ambulate well and has good functionality  PERTINENT  PMH / PSH: Reviewed  OBJECTIVE:   BP 124/89   Pulse 84   Ht 6\' 2"  (1.88 m)   Wt 277 lb (125.6 kg)   SpO2 100%   BMI 35.56 kg/m   General: NAD, well-appearing, well-nourished Respiratory: No respiratory distress, breathing comfortably, able to speak in full sentences Skin: warm and dry, no rashes noted on exposed skin Psych: Appropriate affect and mood Extremities: all toes with some mild distal swelling, not significantly tender to palpation. No obvious swelling present in the foot or ankle.  ASSESSMENT/PLAN:   Hemorrhoid No acute treatment, can use preparation H if another flare - Recommend Metamucil to ensure to decreased straining with bowel movements  Toe swelling/pain Unclear etiology, differential includes increased salt intake, poor vascular flow, arthritis. Would be surprised by arthritis in a patient this young in all toes. BP is reassuringly well controlled on HCTZ (which would also assist with LE swelling). Will try conservative management at this time with close follow-up. - Recommend increasing to a wide shoe size - Trial of compression stockings - Decrease salt intake -  Discussed different methods to lace up shoes to offload pressure from the midfoot  HTN Stable today at 124/89, currently on HCTZ 12.5mg  daily and nifedipine 30mg  daily.  - no changes in management today  Rise Patience, [place]

## 2022-11-11 ENCOUNTER — Ambulatory Visit: Payer: Commercial Managed Care - HMO | Admitting: Allergy

## 2022-11-11 DIAGNOSIS — J309 Allergic rhinitis, unspecified: Secondary | ICD-10-CM

## 2022-11-23 ENCOUNTER — Other Ambulatory Visit: Payer: Self-pay | Admitting: Family Medicine

## 2022-11-23 DIAGNOSIS — I10 Essential (primary) hypertension: Secondary | ICD-10-CM

## 2023-02-12 ENCOUNTER — Ambulatory Visit
Admission: EM | Admit: 2023-02-12 | Discharge: 2023-02-12 | Disposition: A | Discharge status: Home / Self Care | Payer: Commercial Managed Care - HMO | Attending: Urgent Care | Admitting: Urgent Care

## 2023-02-12 DIAGNOSIS — R591 Generalized enlarged lymph nodes: Secondary | ICD-10-CM

## 2023-02-12 DIAGNOSIS — M542 Cervicalgia: Secondary | ICD-10-CM

## 2023-02-12 LAB — POCT MONO SCREEN (KUC): Mono, POC: NEGATIVE

## 2023-02-12 LAB — POCT RAPID STREP A (OFFICE): Rapid Strep A Screen: NEGATIVE

## 2023-02-12 MED ORDER — PSEUDOEPHEDRINE HCL 30 MG PO TABS: 30.0000 mg | ORAL_TABLET | Freq: Three times a day (TID) | ORAL | 0 refills | Status: AC | PRN

## 2023-02-12 MED ORDER — TIZANIDINE HCL 4 MG PO TABS: 4.0000 mg | ORAL_TABLET | Freq: Every day | ORAL | 0 refills | Status: DC

## 2023-02-12 MED ORDER — CETIRIZINE HCL 10 MG PO TABS: 10.0000 mg | ORAL_TABLET | Freq: Every day | ORAL | 0 refills | Status: AC

## 2023-02-12 MED ORDER — NAPROXEN 500 MG PO TABS: 500.0000 mg | ORAL_TABLET | Freq: Two times a day (BID) | ORAL | 0 refills | Status: DC

## 2023-02-12 NOTE — ED Triage Notes (Addendum)
Pt c/o left side neck pain'sweling x 1-1.5 weeks-denies injury/denies flu sx, sore throat,fever-states he started lifting weights x 1 month-NAD-steady gait

## 2023-02-12 NOTE — ED Provider Notes (Signed)
[facility]  Note:  This document was prepared using Systems analyst and may include unintentional dictation errors.  MRN: [id number] DOB: 04/16/1986  Subjective:   Scott Patterson is a 37 y.o. male presenting for 1.5-week history of persistent left-sided neck pain, left-sided neck swelling, throat pain and difficulty swallowing, lump sensation.  No fever, runny or stuffy nose, rash, ear pain, chest pain, shortness of breath, heart racing, palpitations.  No history of heart disease. Has a history of allergic rhinitis, asthma.  He did start working out approximately 1 month ago cannot recall any specific injury.  Has been using ibuprofen without any relief.   Uses albuterol as needed.   No Known Allergies  Past Medical History:  Diagnosis Date   Allergic rhinitis    Anxiety    Asthma    Condyloma 05/08/2020   Depression    Genital herpes 04/02/2011   Hypertension    Inadequate sleep hygiene 11/04/2020   Nodule of anus 07/30/2021   Pain in the abdomen 03/25/2021     Past Surgical History:  Procedure Laterality Date   None      Family History  Problem Relation Age of Onset   Depression Mother    Hypertension Mother    Allergic rhinitis Mother    Alcohol abuse Father    Drug abuse Father    Stroke Father    Hypertension Father    Allergic rhinitis Father    Alcohol abuse Paternal Uncle    Hypertension Brother    Angioedema Neg Hx    Asthma Neg Hx    Atopy Neg Hx    Eczema Neg Hx    Immunodeficiency Neg Hx    Urticaria Neg Hx     Social History   Tobacco Use   Smoking status: Never   Smokeless tobacco: Never  Vaping Use   Vaping Use: Never used  Substance Use Topics   Alcohol use: No   Drug use: No    ROS   Objective:   Vitals: BP 131/85 (BP Location: Right Arm)   Pulse 80   Temp 98.1 F (36.7 C) (Oral)   Resp 18   SpO2 98%   Physical Exam Constitutional:      General: He is not in acute distress.     Appearance: Normal appearance. He is well-developed and normal weight. He is not ill-appearing, toxic-appearing or diaphoretic.  HENT:     Head: Normocephalic and atraumatic.     Right Ear: Tympanic membrane, ear canal and external ear normal. No drainage, swelling or tenderness. No middle ear effusion. There is no impacted cerumen. Tympanic membrane is not erythematous or bulging.     Left Ear: Tympanic membrane, ear canal and external ear normal. No drainage, swelling or tenderness.  No middle ear effusion. There is no impacted cerumen. Tympanic membrane is not erythematous or bulging.     Nose: Nose normal. No congestion or rhinorrhea.     Mouth/Throat:     Mouth: Mucous membranes are moist.     Pharynx: No oropharyngeal exudate or posterior oropharyngeal erythema.     Comments: Thick streaks of post-nasal drainage overlying pharynx.  Eyes:     General: No scleral icterus.       Right eye: No discharge.        Left eye: No discharge.     Extraocular Movements: Extraocular movements intact.     Conjunctiva/sclera: Conjunctivae normal.  Cardiovascular:     Rate and  Rhythm: Normal rate and regular rhythm.     Heart sounds: Normal heart sounds. No murmur heard.    No friction rub. No gallop.  Pulmonary:     Effort: Pulmonary effort is normal. No respiratory distress.     Breath sounds: Normal breath sounds. No stridor. No wheezing, rhonchi or rales.  Musculoskeletal:     Cervical back: Normal range of motion and neck supple. Tenderness (left sided superficial) present. No rigidity. No muscular tenderness.  Lymphadenopathy:     Head:     Right side of head: No submental, submandibular, tonsillar, preauricular, posterior auricular or occipital adenopathy.     Left side of head: No submental, submandibular, tonsillar, preauricular, posterior auricular or occipital adenopathy.     Cervical: Cervical adenopathy present.     Right cervical: No superficial, deep or posterior cervical  adenopathy.    Left cervical: Superficial cervical adenopathy present. No deep or posterior cervical adenopathy.     Upper Body:     Right upper body: No supraclavicular adenopathy.     Left upper body: No supraclavicular adenopathy.  Skin:    General: Skin is warm and dry.  Neurological:     General: No focal deficit present.     Mental Status: He is alert and oriented to person, place, and time.  Psychiatric:        Mood and Affect: Mood normal.        Behavior: Behavior normal.        Thought Content: Thought content normal.     Results for orders placed or performed during the hospital encounter of 02/12/23 (from the past 24 hour(s))  POCT rapid strep A     Status: None   Collection Time: 02/12/23 10:34 AM  Result Value Ref Range   Rapid Strep A Screen Negative Negative  POCT mono screen     Status: None   Collection Time: 02/12/23 10:41 AM  Result Value Ref Range   Mono, POC Negative Negative    Assessment and Plan :   PDMP not reviewed this encounter.  1. Lymphadenopathy   2. Neck pain     Labs pending, recommend conservative management.  Will use naproxen and tizanidine to address lymphadenopathy, musculoskeletal neck pain.  Also recommended allergic rhinitis as a possible source.  No signs of an emergent process, patient with stable vital signs and is appropriate for outpatient management at this stage.  Counseled patient on potential for adverse effects with medications prescribed/recommended today, ER and return-to-clinic precautions discussed, patient verbalized understanding.    Jaynee Eagles, PA-C 02/12/23 1050

## 2023-02-13 LAB — CBC WITH DIFFERENTIAL/PLATELET
Basophils Absolute: 0 10*3/uL (ref 0.0–0.2)
Basos: 0 %
EOS (ABSOLUTE): 0.1 10*3/uL (ref 0.0–0.4)
Eos: 1 %
Hematocrit: 43.6 % (ref 37.5–51.0)
Hemoglobin: 13.5 g/dL (ref 13.0–17.7)
Immature Grans (Abs): 0 10*3/uL (ref 0.0–0.1)
Immature Granulocytes: 0 %
Lymphocytes Absolute: 1.2 10*3/uL (ref 0.7–3.1)
Lymphs: 22 %
MCH: 24.7 pg — ABNORMAL LOW (ref 26.6–33.0)
MCHC: 31 g/dL — ABNORMAL LOW (ref 31.5–35.7)
MCV: 80 fL (ref 79–97)
Monocytes Absolute: 0.4 10*3/uL (ref 0.1–0.9)
Monocytes: 8 %
Neutrophils Absolute: 3.5 10*3/uL (ref 1.4–7.0)
Neutrophils: 69 %
Platelets: 190 10*3/uL (ref 150–450)
RBC: 5.46 x10E6/uL (ref 4.14–5.80)
RDW: 15.3 % (ref 11.6–15.4)
WBC: 5.2 10*3/uL (ref 3.4–10.8)

## 2023-02-13 LAB — COMPREHENSIVE METABOLIC PANEL
ALT: 35 IU/L (ref 0–44)
AST: 25 IU/L (ref 0–40)
Albumin/Globulin Ratio: 1.7 (ref 1.2–2.2)
Albumin: 4.7 g/dL (ref 4.1–5.1)
Alkaline Phosphatase: 90 IU/L (ref 44–121)
BUN/Creatinine Ratio: 11 (ref 9–20)
BUN: 13 mg/dL (ref 6–20)
Bilirubin Total: 0.5 mg/dL (ref 0.0–1.2)
CO2: 27 mmol/L (ref 20–29)
Calcium: 10 mg/dL (ref 8.7–10.2)
Chloride: 102 mmol/L (ref 96–106)
Creatinine, Ser: 1.18 mg/dL (ref 0.76–1.27)
Globulin, Total: 2.8 g/dL (ref 1.5–4.5)
Glucose: 120 mg/dL — ABNORMAL HIGH (ref 70–99)
Potassium: 4.5 mmol/L (ref 3.5–5.2)
Sodium: 139 mmol/L (ref 134–144)
Total Protein: 7.5 g/dL (ref 6.0–8.5)
eGFR: 82 mL/min/{1.73_m2} (ref >59)

## 2023-02-13 LAB — TSH: TSH: 1.55 u[IU]/mL (ref 0.450–4.500)

## 2023-02-27 ENCOUNTER — Other Ambulatory Visit: Payer: Self-pay | Admitting: Family Medicine

## 2023-05-24 ENCOUNTER — Other Ambulatory Visit: Payer: Self-pay

## 2023-05-24 ENCOUNTER — Encounter: Payer: Self-pay | Admitting: Family Medicine

## 2023-05-24 ENCOUNTER — Ambulatory Visit (INDEPENDENT_AMBULATORY_CARE_PROVIDER_SITE_OTHER): Payer: Self-pay | Admitting: Family Medicine

## 2023-05-24 VITALS — BP 134/88 | HR 76 | Ht 74.0 in | Wt 287.0 lb

## 2023-05-24 DIAGNOSIS — I10 Essential (primary) hypertension: Secondary | ICD-10-CM

## 2023-05-24 DIAGNOSIS — S161XXD Strain of muscle, fascia and tendon at neck level, subsequent encounter: Secondary | ICD-10-CM

## 2023-05-24 MED ORDER — NAPROXEN 500 MG PO TABS: 500.0000 mg | ORAL_TABLET | Freq: Two times a day (BID) | ORAL | 0 refills | Status: DC

## 2023-05-24 MED ORDER — TIZANIDINE HCL 4 MG PO TABS: 4.0000 mg | ORAL_TABLET | Freq: Every day | ORAL | 0 refills | Status: DC

## 2023-05-24 NOTE — Patient Instructions (Addendum)
It was wonderful to see you today! Thank you for choosing [facility].   Please bring ALL of your medications with you to every visit.   Today we talked about:  For your neck strain I am referring you to physical therapy for further evaluation and treatment. Our referral coordinator will call you about this. I would also like you to take the Naproxen during the day for relief and take the Tizanidine at night as it can help you feel better. Please come back to the clinic if you are not getting better after a month or have worsening symptoms. Please check your blood pressure 2-3 times per week. I would recommend buying an upper arm cuff and taking per instructions below.  Please follow up for persistent symptoms.  If you haven't already, sign up for My Chart to have easy access to your labs results, and communication with your primary care physician.  Call the clinic at [phone] if your symptoms worsen or you have any concerns.  Please be sure to schedule follow up at the front desk before you leave today.   Elberta Fortis, DO Family Medicine    Blood Pressure Record Sheet To take your blood pressure, you will need a blood pressure machine. You can buy a blood pressure machine (blood pressure monitor) at your clinic, drug store, or online. When choosing one, consider: An automatic monitor that has an arm cuff. A cuff that wraps snugly around your upper arm. You should be able to fit only one finger between your arm and the cuff. A device that stores blood pressure reading results. Do not choose a monitor that measures your blood pressure from your wrist or finger. Follow your health care provider's instructions for how to take your blood pressure. To use this form: Take your blood pressure medications every day These measurements should be taken when you have been at rest for at least 10-15 min Take at least 2 readings with each blood pressure check. This makes sure  the results are correct. Wait 1-2 minutes between measurements. Write down the results in the spaces on this form. Keep in mind it should always be recorded systolic over diastolic. Both numbers are important.  Repeat this every day for 2-3 weeks, or as told by your health care provider.  Make a follow-up appointment with your health care provider to discuss the results.  Blood Pressure Log Date Medications taken? (Y/N) Blood Pressure Time of Day

## 2023-05-24 NOTE — Progress Notes (Unsigned)
    SUBJECTIVE:   CHIEF COMPLAINT / HPI:   Neck pain Seen in ED on 02/12/2023 for similar issue. Possible lymphadenopathy  Hypertension: - Medications: HCTZ 12.5mg  daily and Nifedipine 30mg  daily  - Compliance: Yes - daily - Checking BP at home: No - wants to start - Denies any SOB, CP, vision changes, LE edema, medication SEs, or symptoms of hypotension.  PERTINENT  PMH / PSH: ***  OBJECTIVE:   There were no vitals taken for this visit. ***  General: NAD, pleasant, able to participate in exam Cardiac: RRR, no murmurs. Respiratory: CTAB, normal effort, No wheezes, rales or rhonchi Abdomen: Bowel sounds present, nontender, nondistended Extremities: no edema or cyanosis. Skin: warm and dry, no rashes noted Neuro: alert, no obvious focal deficits Psych: Normal affect and mood  ASSESSMENT/PLAN:   No problem-specific Assessment & Plan notes found for this encounter.     Dr. Elberta Fortis, DO [facility]    {    This will disappear when note is signed, click to select method of visit    :1}

## 2023-05-25 DIAGNOSIS — S161XXA Strain of muscle, fascia and tendon at neck level, initial encounter: Secondary | ICD-10-CM | POA: Insufficient documentation

## 2023-05-25 NOTE — Assessment & Plan Note (Signed)
L neck and shoulder pain x months. Most likely neck strain in the setting of exercise in addition to poor sleeping ergonomics. Low concern for cervical radiculopathy and myelopathy given no radiation or weakness. Prescribed Tizaidine and Naproxen previous with symptomatic relief. -Refer PT for further evaluation -Pain management: Naproxen during the day and Tizanidine at night (reports some sedation with use) as needed

## 2023-05-25 NOTE — Assessment & Plan Note (Addendum)
134/88, borderline hypertensive. Compliant on HCTZ 12.5mg  and Nifedipine 30mg  daily. Planning to start checking BP at home. -Provided BP log and goal BP <130/80. F/u with PCP if consistently above BP goal

## 2023-05-29 IMAGING — DX DG CHEST 1V PORT
1 series · 1 of 1 positions shown · non-contrast
Comparison: Two-view chest x-ray 09/12/2021

CLINICAL DATA: Shortness of breath.  Cough.  Body aches.

EXAM:
PORTABLE CHEST 1 VIEW

[chest ap]
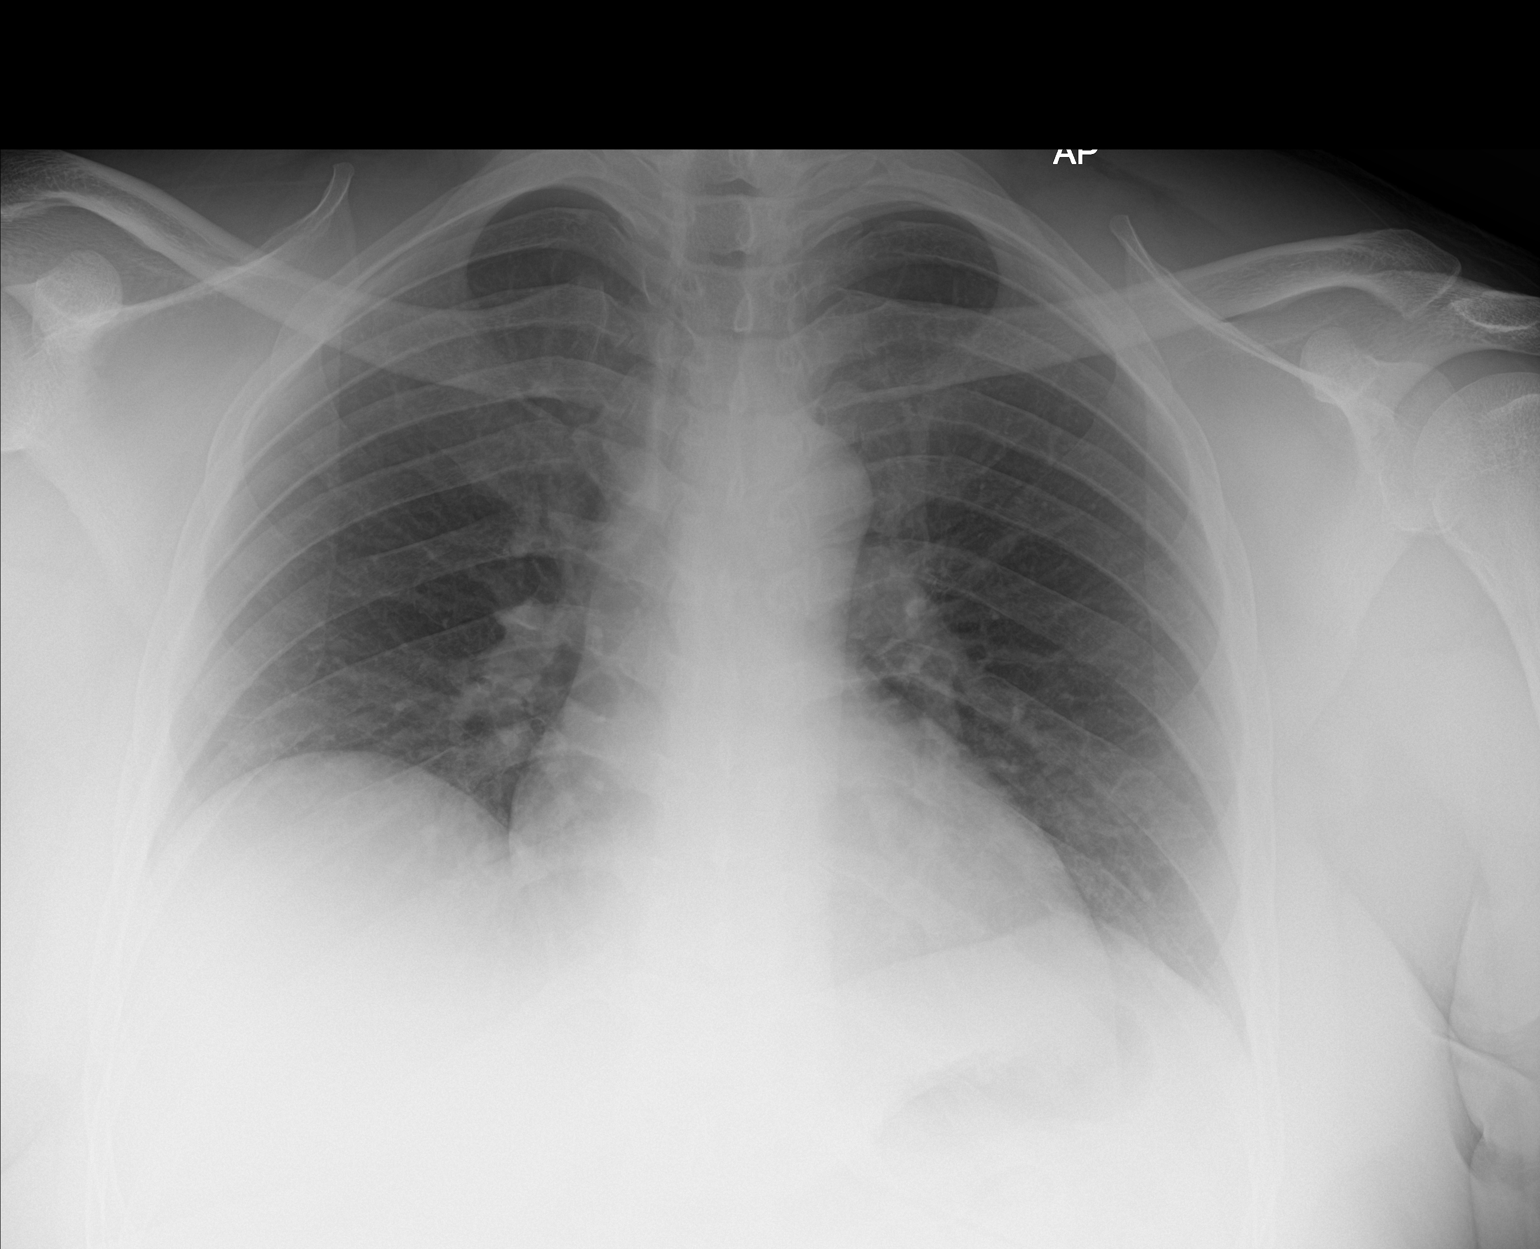

[1 of 1 positions shown; findings below may reference images not displayed]

FINDINGS: Heart size exaggerated by low lung volumes. No edema or effusion is
present. No focal airspace disease is present.
IMPRESSION: 1. Low lung volumes.
2. No acute cardiopulmonary disease.

## 2023-06-11 ENCOUNTER — Ambulatory Visit (INDEPENDENT_AMBULATORY_CARE_PROVIDER_SITE_OTHER): Payer: Commercial Managed Care - HMO

## 2023-06-11 ENCOUNTER — Ambulatory Visit
Admission: EM | Admit: 2023-06-11 | Discharge: 2023-06-11 | Disposition: A | Discharge status: Home / Self Care | Payer: Commercial Managed Care - HMO | Attending: Nurse Practitioner | Admitting: Nurse Practitioner

## 2023-06-11 DIAGNOSIS — M109 Gout, unspecified: Secondary | ICD-10-CM

## 2023-06-11 MED ORDER — PREDNISONE 20 MG PO TABS
40.0000 mg | ORAL_TABLET | Freq: Every day | ORAL | 0 refills | Status: AC
Start: 2023-06-11 — End: 2023-06-16

## 2023-06-11 NOTE — ED Provider Notes (Signed)
[facility]    CSN: [id number] Arrival date & time: 06/11/23  1414      History   Chief Complaint Chief Complaint  Patient presents with   Foot Pain    HPI Scott Patterson is a 37 y.o. male presents for evaluation of foot pain.  Patient reports 4 days ago he was at the gym walking uphill on a treadmill.  He states he began to have some swelling to his foot primarily at the base of his big toe with some redness and warmth that is extended into the top of his foot.  Denies any injury.  Pain is worse with weightbearing it is very tender to palpation.  No fevers or chills.  No history of gout.  Denies any alcohol or change in diet.  He does report eats a lot of seafood.  He has been using ice to treat his symptoms without improvement.  No other concerns at this time.   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 Authorizing Provider  cetirizine (ZYRTEC ALLERGY) 10 MG tablet Take 1 tablet (10 mg total) by mouth daily. 02/12/23  Yes Wallis Bamberg, PA-C  fluticasone (FLONASE) 50 MCG/ACT  nasal spray PLACE 2 SPRAYS INTO BOTH NOSTRILS IN THE MORNING AND AT BEDTIME. 09/15/22  Yes Lilland, Alana, DO  fluticasone furoate-vilanterol (BREO ELLIPTA) 200-25 MCG/ACT AEPB Inhale 1 puff into the lungs daily. 07/08/22  Yes Padgett, Pilar Grammes, MD  hydrochlorothiazide (HYDRODIURIL) 12.5 MG tablet TAKE 1 TABLET BY MOUTH EVERY DAY 02/28/23  Yes Paige, Victoria J, DO  predniSONE (DELTASONE) 20 MG tablet Take 2 tablets (40 mg total) by mouth daily with breakfast for 5 days. 06/11/23 06/16/23 Yes Radford Pax, NP  acetaminophen (TYLENOL) 500 MG tablet Take 1,000 mg by mouth every 8 (eight) hours as needed.    [provider]  albuterol (VENTOLIN HFA) 108 (90 Base) MCG/ACT inhaler INHALE 1-2 PUFFS BY MOUTH EVERY 6 HOURS AS NEEDED FOR WHEEZE OR SHORTNESS OF BREATH 07/08/22   Marcelyn Bruins, MD  ketoconazole (NIZORAL) 2 % cream Apply 1 application. topically 2 (two) times daily. For 2 weeks. 05/06/22   Shirlean Mylar, MD  naproxen (NAPROSYN) 500 MG tablet Take 1 tablet (500 mg total) by mouth 2 (two) times daily with a meal. 05/24/23   Elberta Fortis, MD  NIFEdipine (PROCARDIA-XL/NIFEDICAL-XL) 30 MG 24 hr tablet TAKE 1 TABLET BY MOUTH EVERY DAY 11/23/22   Lilland, Alana, DO  Olopatadine HCl 0.2 %  SOLN Apply 1 drop to eye daily. 07/08/22   Marcelyn Bruins, MD  Olopatadine-Mometasone Cristal Generous) [phone] MCG/ACT SUSP Place 2 sprays into the nose 2 (two) times daily. 07/08/22   Marcelyn Bruins, MD  oxymetazoline (AFRIN NASAL SPRAY) 0.05 % nasal spray Place 1 spray into both nostrils 2 (two) times daily. 04/14/22   Dana Allan, MD  pseudoephedrine (SUDAFED) 30 MG tablet Take 1 tablet (30 mg total) by mouth every 8 (eight) hours as needed for congestion. 02/12/23   Wallis Bamberg, PA-C  Spacer/Aero-Hold Chamber Bags MISC Use with inhaler 03/22/19   Westley Chandler, MD  tiZANidine (ZANAFLEX) 4 MG tablet Take 1 tablet (4 mg total) by mouth at bedtime. 05/24/23   Elberta Fortis, MD     Family History Family History  Problem Relation Age of Onset   Depression Mother    Hypertension Mother    Allergic rhinitis Mother    Alcohol abuse Father    Drug abuse Father    Stroke Father    Hypertension Father    Allergic rhinitis Father    Alcohol abuse Paternal Uncle    Hypertension Brother    Angioedema Neg Hx    Asthma Neg Hx    Atopy Neg Hx    Eczema Neg Hx    Immunodeficiency Neg Hx    Urticaria Neg Hx     Social History Social History   Tobacco Use   Smoking status: Never   Smokeless tobacco: Never  Vaping Use   Vaping Use: Never used  Substance Use Topics   Alcohol use: No   Drug use: No     Allergies   Patient has no known allergies.   Review of Systems Review of Systems  Musculoskeletal:        Left foot pain     Physical Exam Triage Vital Signs ED Triage Vitals  Enc Vitals Group     BP 06/11/23 1444 (!) 141/95     Pulse Rate 06/11/23 1444 94     Resp 06/11/23 1444 16     Temp 06/11/23 1444 99.6 F (37.6 C)     Temp Source 06/11/23 1444 Oral     SpO2 06/11/23 1444 95 %     Weight --      Height --      Head Circumference --      Peak Flow --      Pain Score 06/11/23 1453 8     Pain Loc --      Pain Edu? --      Excl. in GC? --    No data found.  Updated Vital Signs BP (!) 141/95 (BP Location: Right Arm)   Pulse 94   Temp 99.6 F (37.6 C) (Oral)   Resp 16   SpO2 95%   Visual Acuity Right Eye Distance:   Left Eye Distance:   Bilateral Distance:    Right Eye Near:   Left Eye Near:    Bilateral Near:     Physical Exam Vitals and nursing note reviewed.  Constitutional:      Appearance: Normal appearance.  HENT:     Head: Normocephalic and atraumatic.  Eyes:     Pupils: Pupils are equal, round, and reactive to light.  Cardiovascular:     Rate and Rhythm: Normal rate.  Pulmonary:     Effort: Pulmonary effort is normal.  Musculoskeletal:       Feet:  Feet:     Comments: There is mild erythema  with  warmth and moderate tenderness palpation to the left first MTP joint.  Mild swelling extends to the dorsum of the foot without erythema or warmth.  DP +2.  Cap refill +2. Skin:    General: Skin is warm and dry.  Neurological:     General: No focal deficit present.     Mental Status: He is alert and oriented to person, place, and time.  Psychiatric:        Mood and Affect: Mood normal.        Behavior: Behavior normal.      UC Treatments / Results  Labs (all labs ordered are listed, but only abnormal results are displayed) Labs Reviewed - No data to display  EKG   Radiology DG Foot Complete Left  Result Date: 06/11/2023 CLINICAL DATA:  Swelling and redness left foot.  No injury. EXAM: LEFT FOOT - COMPLETE 3+ VIEW COMPARISON:  None Available. FINDINGS: Bony structures, joint spaces and soft tissues are unremarkable. No evidence of fracture or dislocation. No air within the soft tissues. Minimal soft tissue swelling over the dorsal aspect of the mid to forefoot. IMPRESSION: 1. No acute bony findings. 2. Minimal soft tissue swelling over the dorsal aspect of the mid to forefoot. Electronically Signed   By: Elberta Fortis M.D.   On: 06/11/2023 15:45    Procedures Procedures (including critical care time)  Medications Ordered in UC Medications - No data to display  Initial Impression / Assessment and Plan / UC Course  I have reviewed the triage vital signs and the nursing notes.  Pertinent labs & imaging results that were available during my care of the patient were reviewed by me and considered in my medical decision making (see chart for details).  Clinical Course as of 06/11/23 1551  Sat Jun 11, 2023  1549 Temperature recheck 98.4 [JM]    Clinical Course User Index [JM] Radford Pax, NP    Reviewed exam and symptoms with patient.  X-ray negative for fracture.  Discussed symptoms consistent with gout.  Given length of symptoms we will do prednisone for 5 days.  Discussed low  purine diet.  Patient requested crutches and he was fitted by nursing staff.  Follow-up with PCP if symptoms do not improve.  ER precautions reviewed and patient verbalized understanding. Final Clinical Impressions(s) / UC Diagnoses   Final diagnoses:  Acute gout involving toe of left foot, unspecified cause     Discharge Instructions      Start prednisone daily for 5 days.  You may use crutches to help you get around as needed.  Please avoid alcohol and seafood and red meats.  Please follow-up with your PCP if your symptoms or not improving.  Please go to the emergency room if you develop any worsening symptoms.  I hope you feel better soon!     ED Prescriptions     Medication Sig Dispense Auth. Provider   predniSONE (DELTASONE) 20 MG tablet Take 2 tablets (40 mg total) by mouth daily with breakfast for 5 days. 10 tablet Radford Pax, NP      PDMP not reviewed this encounter.   Radford Pax, NP 06/11/23 1558

## 2023-06-11 NOTE — Discharge Instructions (Addendum)
Start prednisone daily for 5 days.  You may use crutches to help you get around as needed.  Please avoid alcohol and seafood and red meats.  Please follow-up with your PCP if your symptoms or not improving.  Please go to the emergency room if you develop any worsening symptoms.  I hope you feel better soon!

## 2023-06-11 NOTE — ED Triage Notes (Signed)
Pt states he went to the gym Tuesday and now is having swelling and redness to his left foot. Pt states no recent injury to foot besides going to the gym and walking on the treadmill.

## 2023-06-19 ENCOUNTER — Other Ambulatory Visit: Payer: Self-pay | Admitting: Family Medicine

## 2023-06-19 DIAGNOSIS — S161XXD Strain of muscle, fascia and tendon at neck level, subsequent encounter: Secondary | ICD-10-CM

## 2023-06-30 ENCOUNTER — Ambulatory Visit (INDEPENDENT_AMBULATORY_CARE_PROVIDER_SITE_OTHER): Payer: Commercial Managed Care - HMO | Admitting: Family Medicine

## 2023-06-30 VITALS — BP 130/87 | HR 85 | Ht 74.0 in | Wt 283.4 lb

## 2023-06-30 DIAGNOSIS — M109 Gout, unspecified: Secondary | ICD-10-CM

## 2023-06-30 MED ORDER — COLCHICINE 0.6 MG PO TABS
0.6000 mg | ORAL_TABLET | Freq: Every day | ORAL | 1 refills | Status: DC
Start: 2023-06-30 — End: 2023-07-08

## 2023-06-30 NOTE — Progress Notes (Signed)
    SUBJECTIVE:   CHIEF COMPLAINT / HPI:   RB is a 37yo M w/ hx of gout, HTN, and asthma that p/f gout flare. - Was seen in UC 6/29 for L toe pain, was diagnosed w/ gout - Had steroid for gout in L toe and was getting better and able to walk again. It was a steroid course for 5 days. - Last week, ate a lot shrimp for 3 days in a row, then gout on L foot started getting worse again.  OBJECTIVE:   BP 130/87   Pulse 85   Ht 6\' 2"  (1.88 m)   Wt 283 lb 6.4 oz (128.5 kg)   SpO2 100%   BMI 36.39 kg/m   General: Alert, pleasant man. NAD. HEENT: NCAT. MMM. CV: RRR, no murmurs. Cap refill <2. Resp: CTAB, no wheezing or crackles. Normal WOB on RA.  Abm: Soft, nontender, nondistended.  Ext: Erythema, swelling, and tenderness at base of L great toe. Skin: Warm, well perfused   ASSESSMENT/PLAN:   Gout flare Had gout flare 6/29 of L big toe, given 5 day steroid course and started to improve, then ate a bunch of shrimp for 3 days in a row, then flare of L big toes got worse again. Suspect that diet lead to current flare.  - Start colchicine 1.2, then 0.6 an hour later on first day. Then 0.6 daily until flare resolves. - Discussed dietary changes to prevent future flares. Handout given. - If having another flare this year, advised to come back and discuss preventation meds (allopurinol) - Consider uric acid level in future, unable to obtain today.   Lincoln Brigham, MD [facility]

## 2023-06-30 NOTE — Assessment & Plan Note (Signed)
Had gout flare 6/29 of L big toe, given 5 day steroid course and started to improve, then ate a bunch of shrimp for 3 days in a row, then flare of L big toes got worse again. Suspect that diet lead to current flare.  - Start colchicine 1.2, then 0.6 an hour later on first day. Then 0.6 daily until flare resolves. - Discussed dietary changes to prevent future flares. Handout given. - If having another flare this year, advised to come back and discuss preventation meds (allopurinol) - Consider uric acid level in future, unable to obtain today.

## 2023-06-30 NOTE — Patient Instructions (Addendum)
Good to see you today - Thank you for coming in  Things we discussed today:  1) For your gout flare - Take Colchicine 2 tablets today, wait 1 hour, then take another 1 tablet - Then you can take 1 tablet every day until the flare resolves. - If you get another flare this year, come back and we can discuss other medications to help prevent gout flares.

## 2023-07-07 ENCOUNTER — Other Ambulatory Visit: Payer: Self-pay | Admitting: Family Medicine

## 2023-07-07 DIAGNOSIS — M109 Gout, unspecified: Secondary | ICD-10-CM

## 2023-07-29 ENCOUNTER — Other Ambulatory Visit: Payer: Self-pay | Admitting: Family Medicine

## 2023-07-29 DIAGNOSIS — S161XXD Strain of muscle, fascia and tendon at neck level, subsequent encounter: Secondary | ICD-10-CM

## 2023-08-14 ENCOUNTER — Other Ambulatory Visit: Payer: Self-pay | Admitting: Student

## 2023-08-14 DIAGNOSIS — S161XXD Strain of muscle, fascia and tendon at neck level, subsequent encounter: Secondary | ICD-10-CM

## 2023-08-15 ENCOUNTER — Other Ambulatory Visit: Payer: Self-pay | Admitting: Allergy

## 2023-08-16 NOTE — Telephone Encounter (Signed)
Called and left a message for patient to inform him that he is overdue for an appointment. He should schedule and keep his next appointment for further refills.

## 2023-08-29 ENCOUNTER — Encounter: Payer: Commercial Managed Care - HMO | Admitting: Student

## 2023-09-01 ENCOUNTER — Ambulatory Visit (INDEPENDENT_AMBULATORY_CARE_PROVIDER_SITE_OTHER): Payer: Commercial Managed Care - HMO | Admitting: Student

## 2023-09-01 VITALS — BP 116/69 | HR 87 | Ht 74.0 in | Wt 282.8 lb

## 2023-09-01 DIAGNOSIS — Z0000 Encounter for general adult medical examination without abnormal findings: Secondary | ICD-10-CM | POA: Diagnosis not present

## 2023-09-01 MED ORDER — ALBUTEROL SULFATE HFA 108 (90 BASE) MCG/ACT IN AERS: INHALATION_SPRAY | RESPIRATORY_TRACT | 0 refills | Status: DC

## 2023-09-01 NOTE — Progress Notes (Signed)
Annual Wellness Visit     Patient: Scott Patterson, Male    DOB: [birth year], 37 y.o.   MRN: [id number]  Subjective  Chief Complaint  Patient presents with   Annual Exam    Scott Patterson is a 37 y.o. male who presents today for his Annual Wellness Visit.   Diet: Avoids fried food, soda, pork, beef Sleep: 8hrs on average. Sleeps through the night, No sleep apnea Exercise: Yes, 2-3 days awake for an hour at the gym Alcohol use:  No Tobacco use: Never  Illicit drug use: No Sexually active: yes, protection consistently with one male partner Works as: Engineer, agricultural  Lives with: Alone Code Status: Full Code Power of Attorney: None  Medical concerns:  Lump at the back of head, for few months. Non tender and unsure if it's growing in size.  Medications: Outpatient Medications Prior to Visit  Medication Sig   acetaminophen (TYLENOL) 500 MG tablet Take 1,000 mg by mouth every 8 (eight) hours as needed.   cetirizine (ZYRTEC ALLERGY) 10 MG tablet Take 1 tablet (10 mg total) by mouth daily.   colchicine 0.6 MG tablet TAKE 1 TABLET (0.6 MG TOTAL) BY MOUTH DAILY. ON THE FIRST DAY, TAKE 2 TABLETS, WAIT 1 HOUR, THEN TAKE ANOTHER 1 TABLET. THEN TAKE 1 TABLET DAILY UNTIL THE FLARE IMPROVES.   fluticasone (FLONASE) 50 MCG/ACT nasal spray PLACE 2 SPRAYS INTO BOTH NOSTRILS IN THE MORNING AND AT BEDTIME.   fluticasone furoate-vilanterol (BREO ELLIPTA) 200-25 MCG/ACT AEPB Inhale 1 puff into the lungs daily.   hydrochlorothiazide (HYDRODIURIL) 12.5 MG tablet TAKE 1 TABLET BY MOUTH EVERY DAY   ketoconazole (NIZORAL) 2 % cream Apply 1 application. topically 2 (two) times daily. For 2 weeks.   naproxen (NAPROSYN) 500 MG tablet Take 1 tablet (500 mg total) by mouth 2 (two) times daily with a meal.   NIFEdipine (PROCARDIA-XL/NIFEDICAL-XL) 30 MG 24 hr tablet TAKE 1 TABLET BY MOUTH EVERY DAY   Olopatadine HCl 0.2 % SOLN Apply 1 drop to eye daily.   Olopatadine-Mometasone (RYALTRIS) [id number] MCG/ACT  SUSP Place 2 sprays into the nose 2 (two) times daily.   oxymetazoline (AFRIN NASAL SPRAY) 0.05 % nasal spray Place 1 spray into both nostrils 2 (two) times daily.   pseudoephedrine (SUDAFED) 30 MG tablet Take 1 tablet (30 mg total) by mouth every 8 (eight) hours as needed for congestion.   Spacer/Aero-Hold Chamber [employer] Use with inhaler   tiZANidine (ZANAFLEX) 4 MG tablet TAKE 1 TABLET BY MOUTH EVERYDAY AT BEDTIME   [DISCONTINUED] albuterol (VENTOLIN HFA) 108 (90 Base) MCG/ACT inhaler INHALE 1-2 PUFFS BY MOUTH EVERY 6 HOURS AS NEEDED FOR WHEEZE OR SHORTNESS OF BREATH   No facility-administered medications prior to visit.    No Known Allergies  Patient Care Team: Jerre Simon, MD as PCP - General (Family Medicine)  Review of Systems  Constitutional:  Negative for chills, fever and malaise/fatigue.  HENT:  Negative for congestion, ear pain, hearing loss, sore throat and tinnitus.   Eyes:  Negative for blurred vision, double vision and photophobia.  Respiratory:  Negative for cough, hemoptysis and shortness of breath.   Cardiovascular:  Negative for chest pain, palpitations and leg swelling.  Gastrointestinal:  Negative for abdominal pain, blood in stool, heartburn, nausea and vomiting.  Genitourinary:  Negative for dysuria and hematuria.  Musculoskeletal:  Negative for back pain, myalgias and neck pain.  Neurological:  Positive for headaches (years responding well to tylenol. On the left side). Negative for  dizziness, tremors, speech change and weakness.    Objective  BP 116/69   Pulse 87   Ht 6\' 2"  (1.88 m)   Wt 282 lb 12.8 oz (128.3 kg)   SpO2 100%   BMI 36.31 kg/m    Physical Exam Constitutional:      Appearance: Normal appearance. He is obese.  HENT:     Head: Normocephalic and atraumatic.     Comments: 2cm soft, rubbery nontender mass on the left side of the posterior head.    Right Ear: Tympanic membrane normal.     Left Ear: Tympanic membrane normal.      Mouth/Throat:     Mouth: Mucous membranes are moist.  Eyes:     Extraocular Movements: Extraocular movements intact.     Conjunctiva/sclera: Conjunctivae normal.     Pupils: Pupils are equal, round, and reactive to light.  Cardiovascular:     Rate and Rhythm: Normal rate and regular rhythm.     Pulses: Normal pulses.     Heart sounds: Normal heart sounds.  Pulmonary:     Effort: Pulmonary effort is normal.     Breath sounds: Normal breath sounds.  Abdominal:     General: Abdomen is flat. Bowel sounds are normal.     Palpations: Abdomen is soft.  Musculoskeletal:        General: Normal range of motion.     Cervical back: Normal range of motion and neck supple.  Skin:    General: Skin is warm and dry.     Capillary Refill: Capillary refill takes less than 2 seconds.  Neurological:     General: No focal deficit present.     Mental Status: He is alert. Mental status is at baseline.  Psychiatric:        Mood and Affect: Mood normal.        Behavior: Behavior normal.        Thought Content: Thought content normal.     Assessment & Plan   Overall healthy 37 year old male who is only medical concern today was lump at the back of his head.  Exam found to have 2 cm soft mobile mass at the posterior head more consistent with lipoma.  Provided reassurance to patient.  Patient endorses chronic headache which he has had for years.  No change in frequency or intensity and does not wake him up at sleep.  Suspect headache could be possible tension headache vs migraine headache.  However seems to respond well to Tylenol.  Discussed ED precautions and return precautions with patient who verbalized understanding and agreeable to plan.   Annual wellness visit done today including the all of the following: Reviewed patient's Family Medical History Reviewed and updated list of patient's medical providers Assessment of cognitive impairment was done Assessed patient's functional  ability Established a written schedule for health screening services Health Risk Assessent Completed and Reviewed  Exercise Activities and Dietary recommendations - Discussed recommendation for high tensity exercise goal of  30-40 minutes for at least 300-5 days a week.   Immunization History  Administered Date(s) Administered   PFIZER(Purple Top)SARS-COV-2 Vaccination 09/16/2020, 10/13/2020   Pfizer Covid-19 Vaccine Bivalent Booster 82yrs & up 05/06/2022   Td 09/12/2002    Health Maintenance  Topic Date Due   DTaP/Tdap/Td (2 - Tdap) 09/12/2012   INFLUENZA VACCINE  Never done   COVID-19 Vaccine (4 - 2023-24 season) 08/14/2023   Hepatitis C Screening  Completed   HIV Screening  Completed   HPV VACCINES  Aged Out     Discussed health benefits of physical activity, and encouraged him to engage in regular exercise appropriate for his age and condition.    Healthcare maintenance -Patient due for flu and COVID-vaccine.  Offered however patient declined.  Risk and benefit discussed with patient.  Jerre Simon, MD

## 2023-09-01 NOTE — Patient Instructions (Addendum)
It was wonderful to meet you today. Thank you for allowing me to be a part of your care. Below is a short summary of what we discussed at your visit today:  To hear that you are doing well overall.  Suspect your headaches could be tension headache due to stress or you might have underlying migraines.  Ibuprofen/Tylenol could help with this headache.  Should you start noticing worsening headache or increase intensity in this headache please return to clinic for further evaluation.  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  I have refilled your albuterol.  If you have any questions or concerns, please do not hesitate to contact us via phone or MyChart message.   Jerre Simon, MD [facility]   Lipoma    A lipoma is a noncancerous (benign) tumor that is made up of fat cells. This is a very common type of soft-tissue growth. Lipomas are usually found under the skin (subcutaneous). They may occur in any tissue of the body that contains fat. Common areas for lipomas to appear include the back, arms, shoulders, buttocks, and thighs. Lipomas grow slowly, and they are usually painless. Most lipomas do not cause problems and do not require treatment. What are the causes? The cause of this condition is not known. What increases the risk? You are more likely to develop this condition if: You are 73-56 years old. You have a family history of lipomas. What are the signs or symptoms? A lipoma usually appears as a small, round bump under the skin. In most cases, the lump will: Feel soft or rubbery. Not cause pain or other symptoms. However, if a lipoma is located in an area where it pushes on nerves, it can become painful or cause other symptoms. How is this diagnosed? A lipoma can usually be diagnosed with a physical exam. You may also have tests to confirm the diagnosis and to rule out other conditions. Tests  may include: Imaging tests, such as a CT scan or an MRI. Removal of a tissue sample to be looked at under a microscope (biopsy). How is this treated? Treatment for this condition depends on the size of the lipoma and whether it is causing any symptoms. For small lipomas that are not causing problems, no treatment is needed. If a lipoma is bigger or it causes problems, surgery may be done to remove the lipoma. Lipomas can also be removed to improve appearance. Most often, the procedure is done after applying a medicine that numbs the area (local anesthetic). Liposuction may be done to reduce the size of the lipoma before it is removed through surgery, or it may be done to remove the lipoma. Lipomas are removed with this method to limit incision size and scarring. A liposuction tube is inserted through a small incision into the lipoma, and the contents of the lipoma are removed through the tube with suction. Follow these instructions at home: Watch your lipoma for any changes. Keep all follow-up visits. This is important. Where to find more information OrthoInfo: orthoinfo.aaos.org Contact a health care provider if: Your lipoma becomes larger or hard. Your lipoma becomes painful, red, or increasingly swollen. These could be signs of infection or a more serious condition. Get help right away if: You develop tingling or numbness in an area near the lipoma. This could indicate that the lipoma is causing nerve damage. Summary A lipoma is a  noncancerous tumor that is made up of fat cells. Most lipomas do not cause problems and do not require treatment. If a lipoma is bigger or it causes problems, surgery may be done to remove the lipoma. Contact a health care provider if your lipoma becomes larger or hard, or if it becomes painful, red, or increasingly swollen. These could be signs of infection or a more serious condition. This information is not intended to replace advice given to you by your health  care provider. Make sure you discuss any questions you have with your health care provider. Document Revised: 12/18/2021 Document Reviewed: 12/18/2021 Elsevier Patient Education  2024 [employer].

## 2023-09-26 ENCOUNTER — Emergency Department (HOSPITAL_BASED_OUTPATIENT_CLINIC_OR_DEPARTMENT_OTHER)
Admission: EM | Admit: 2023-09-26 | Discharge: 2023-09-26 | Disposition: A | Discharge status: Home / Self Care | Payer: Commercial Managed Care - HMO | Attending: Emergency Medicine | Admitting: Emergency Medicine

## 2023-09-26 ENCOUNTER — Other Ambulatory Visit: Payer: Self-pay

## 2023-09-26 ENCOUNTER — Emergency Department (HOSPITAL_BASED_OUTPATIENT_CLINIC_OR_DEPARTMENT_OTHER): Payer: Commercial Managed Care - HMO

## 2023-09-26 DIAGNOSIS — J45909 Unspecified asthma, uncomplicated: Secondary | ICD-10-CM | POA: Diagnosis not present

## 2023-09-26 DIAGNOSIS — J069 Acute upper respiratory infection, unspecified: Secondary | ICD-10-CM | POA: Diagnosis not present

## 2023-09-26 DIAGNOSIS — Z79899 Other long term (current) drug therapy: Secondary | ICD-10-CM | POA: Insufficient documentation

## 2023-09-26 DIAGNOSIS — Z7951 Long term (current) use of inhaled steroids: Secondary | ICD-10-CM | POA: Insufficient documentation

## 2023-09-26 DIAGNOSIS — I10 Essential (primary) hypertension: Secondary | ICD-10-CM | POA: Insufficient documentation

## 2023-09-26 DIAGNOSIS — R0602 Shortness of breath: Secondary | ICD-10-CM | POA: Diagnosis present

## 2023-09-26 DIAGNOSIS — R0600 Dyspnea, unspecified: Secondary | ICD-10-CM

## 2023-09-26 DIAGNOSIS — Z7952 Long term (current) use of systemic steroids: Secondary | ICD-10-CM | POA: Insufficient documentation

## 2023-09-26 LAB — CBC
HCT: 44.2 % (ref 39.0–52.0)
Hemoglobin: 14.3 g/dL (ref 13.0–17.0)
MCH: 25.5 pg — ABNORMAL LOW (ref 26.0–34.0)
MCHC: 32.4 g/dL (ref 30.0–36.0)
MCV: 78.9 fL — ABNORMAL LOW (ref 80.0–100.0)
Platelets: 199 10*3/uL (ref 150–400)
RBC: 5.6 MIL/uL (ref 4.22–5.81)
RDW: 15.8 % — ABNORMAL HIGH (ref 11.5–15.5)
WBC: 9.1 10*3/uL (ref 4.0–10.5)
nRBC: 0 % (ref 0.0–0.2)

## 2023-09-26 LAB — BASIC METABOLIC PANEL
Anion gap: 8 (ref 5–15)
BUN: 17 mg/dL (ref 6–20)
CO2: 25 mmol/L (ref 22–32)
Calcium: 9.8 mg/dL (ref 8.9–10.3)
Chloride: 103 mmol/L (ref 98–111)
Creatinine, Ser: 1.36 mg/dL — ABNORMAL HIGH (ref 0.61–1.24)
GFR, Estimated: >60 mL/min (ref >60)
Glucose, Bld: 139 mg/dL — ABNORMAL HIGH (ref 70–99)
Potassium: 3.4 mmol/L — ABNORMAL LOW (ref 3.5–5.1)
Sodium: 136 mmol/L (ref 135–145)

## 2023-09-26 LAB — TROPONIN I (HIGH SENSITIVITY)
Troponin I (High Sensitivity): 4 ng/L (ref <18)
Troponin I (High Sensitivity): 5 ng/L (ref <18)

## 2023-09-26 LAB — D-DIMER, QUANTITATIVE: D-Dimer, Quant: <0.27 ug{FEU}/mL (ref 0.00–0.50)

## 2023-09-26 MED ORDER — ALBUTEROL SULFATE HFA 108 (90 BASE) MCG/ACT IN AERS
2.0000 | INHALATION_SPRAY | RESPIRATORY_TRACT | Status: DC | PRN
  Administered 2023-09-26: 2 via RESPIRATORY_TRACT
  Filled 2023-09-26: qty 6.7

## 2023-09-26 MED ORDER — AEROCHAMBER PLUS FLO-VU LARGE MISC
1.0000 | Freq: Once | Status: DC
  Filled 2023-09-26: qty 1

## 2023-09-26 MED ORDER — METHYLPREDNISOLONE SODIUM SUCC 125 MG IJ SOLR
125.0000 mg | Freq: Once | INTRAMUSCULAR | Status: AC
  Administered 2023-09-26: 125 mg via INTRAVENOUS
  Filled 2023-09-26: qty 2

## 2023-09-26 MED ORDER — PREDNISONE 20 MG PO TABS: 40.0000 mg | ORAL_TABLET | Freq: Every day | ORAL | 0 refills | Status: AC

## 2023-09-26 MED ORDER — IPRATROPIUM-ALBUTEROL 0.5-2.5 (3) MG/3ML IN SOLN
3.0000 mL | Freq: Once | RESPIRATORY_TRACT | Status: AC
  Administered 2023-09-26: 3 mL via RESPIRATORY_TRACT
  Filled 2023-09-26: qty 3

## 2023-09-26 NOTE — ED Notes (Signed)
While ambulating sats maintained at 100% and HR at 113. MD aware

## 2023-09-26 NOTE — ED Notes (Signed)
RT educated pt on proper use of MDI w/spacer. Pt able to perform and teach back to RT w/out difficulty. Pt verbalizes understanding of teaching.   09/26/23 2130  Aerosol Therapy Tx  $ Hand Held Nebulizer  1  Medications Albuterol  Delivery Device MDI (w/spacer)  Pre-Treatment Pulse 98  Pre-Treatment Respirations 19  Treatment Tolerance Tolerated well  Treatment Given 1  MEWS Score/Color  MEWS Score 0  MEWS Score Color Green  RT Breath Sounds  Bilateral Breath Sounds Clear;Diminished  R Upper  Breath Sounds Clear  L Upper Breath Sounds Clear  R Lower Breath Sounds Diminished  L Lower Breath Sounds Diminished  Oxygen Therapy/Pulse Ox  O2 Device Room Air  O2 Therapy Room air  SpO2 96 %

## 2023-09-26 NOTE — ED Triage Notes (Signed)
Has felt palpitations x1 week. Increased SOB with this feeling. Has been taking albuterol to treat Sob with no relief. Last use 2-3 hrs PTA. 140's in triage. Denies CP. -N/-V/-D. No urinary changes. Afebrile.  Hx asthma and HTN.

## 2023-09-26 NOTE — ED Provider Notes (Signed)
[place] EMERGENCY DEPARTMENT AT [facility] Provider Note   CSN: [id number] Arrival date & time: 09/26/23  1840     History  Chief Complaint  Patient presents with   Tachycardia    Scott Patterson is a 37 y.o. male.  HPI Patient with history of asthma.  Shortness of breath.  Had for around a week.  States he feels tight in his throat and upper chest.  Has been using inhaler.  No definite sick contacts.  No chest pain.  No fevers.   Past Medical History:  Diagnosis Date   Allergic rhinitis    Anxiety    Asthma    Condyloma 05/08/2020   Depression    Genital herpes 04/02/2011   Hypertension    Inadequate sleep hygiene 11/04/2020   Nodule of anus 07/30/2021   Pain in the abdomen 03/25/2021    Home Medications Prior to Admission medications   Medication Sig Start Date End Date Taking? Authorizing Provider  predniSONE (DELTASONE) 20 MG tablet Take 2 tablets (40 mg total) by mouth daily. 09/26/23  Yes Benjiman Core, MD  acetaminophen (TYLENOL) 500 MG tablet Take 1,000 mg by mouth every 8 (eight) hours as needed.    [provider]  albuterol (VENTOLIN HFA) 108 (90 Base) MCG/ACT inhaler INHALE 1-2 PUFFS BY MOUTH EVERY 6 HOURS AS NEEDED FOR WHEEZE OR SHORTNESS OF BREATH 09/01/23   Jerre Simon, MD  cetirizine (ZYRTEC ALLERGY) 10 MG tablet Take 1 tablet (10 mg total) by mouth daily. 02/12/23   Wallis Bamberg, PA-C  colchicine 0.6 MG tablet TAKE 1 TABLET (0.6 MG TOTAL) BY MOUTH DAILY. ON THE FIRST DAY, TAKE 2 TABLETS, WAIT 1 HOUR, THEN TAKE ANOTHER 1 TABLET. THEN TAKE 1 TABLET DAILY UNTIL THE FLARE IMPROVES. 07/08/23   Jerre Simon, MD  fluticasone (FLONASE) 50 MCG/ACT nasal spray PLACE 2 SPRAYS INTO BOTH NOSTRILS IN THE MORNING AND AT BEDTIME. 09/15/22   Lilland, Alana, DO  fluticasone furoate-vilanterol (BREO ELLIPTA) 200-25 MCG/ACT AEPB Inhale 1 puff into the lungs daily. 07/08/22   Marcelyn Bruins, MD  hydrochlorothiazide (HYDRODIURIL) 12.5 MG tablet  TAKE 1 TABLET BY MOUTH EVERY DAY 02/28/23   Idalia Needle, Turkey J, DO  ketoconazole (NIZORAL) 2 % cream Apply 1 application. topically 2 (two) times daily. For 2 weeks. 05/06/22   Shirlean Mylar, MD  naproxen (NAPROSYN) 500 MG tablet Take 1 tablet (500 mg total) by mouth 2 (two) times daily with a meal. 05/24/23   Elberta Fortis, MD  NIFEdipine (PROCARDIA-XL/NIFEDICAL-XL) 30 MG 24 hr tablet TAKE 1 TABLET BY MOUTH EVERY DAY 11/23/22   Lilland, Alana, DO  Olopatadine HCl 0.2 % SOLN Apply 1 drop to eye daily. 07/08/22   Marcelyn Bruins, MD  Olopatadine-Mometasone Cristal Generous) [phone] MCG/ACT SUSP Place 2 sprays into the nose 2 (two) times daily. 07/08/22   Marcelyn Bruins, MD  oxymetazoline (AFRIN NASAL SPRAY) 0.05 % nasal spray Place 1 spray into both nostrils 2 (two) times daily. 04/14/22   Dana Allan, MD  pseudoephedrine (SUDAFED) 30 MG tablet Take 1 tablet (30 mg total) by mouth every 8 (eight) hours as needed for congestion. 02/12/23   Wallis Bamberg, PA-C  Spacer/Aero-Hold Chamber Bags MISC Use with inhaler 03/22/19   Westley Chandler, MD  tiZANidine (ZANAFLEX) 4 MG tablet TAKE 1 TABLET BY MOUTH EVERYDAY AT BEDTIME 08/18/23   Alicia Amel, MD      Allergies    Patient has no known allergies.    Review of Systems  Review of Systems  Physical Exam Updated Vital Signs BP 112/78   Pulse 97   Temp 97.8 F (36.6 C)   Resp 18   SpO2 93%  Physical Exam Vitals and nursing note reviewed.  Cardiovascular:     Rate and Rhythm: Regular rhythm. Tachycardia present.  Pulmonary:     Breath sounds: No wheezing or rhonchi.     Comments: Harsh breath sounds without focal rales or rhonchi. Abdominal:     Tenderness: There is no abdominal tenderness.  Musculoskeletal:        General: No tenderness.     Right lower leg: No edema.     Left lower leg: No edema.  Skin:    General: Skin is warm.  Neurological:     Mental Status: He is alert and oriented to person, place, and time.      ED Results / Procedures / Treatments   Labs (all labs ordered are listed, but only abnormal results are displayed) Labs Reviewed  BASIC METABOLIC PANEL - Abnormal; Notable for the following components:      Result Value   Potassium 3.4 (*)    Glucose, Bld 139 (*)    Creatinine, Ser 1.36 (*)    All other components within normal limits  CBC - Abnormal; Notable for the following components:   MCV 78.9 (*)    MCH 25.5 (*)    RDW 15.8 (*)    All other components within normal limits  D-DIMER, QUANTITATIVE  TROPONIN I (HIGH SENSITIVITY)  TROPONIN I (HIGH SENSITIVITY)    EKG EKG Interpretation Date/Time:  Monday September 26 2023 18:54:36 EDT Ventricular Rate:  136 PR Interval:  144 QRS Duration:  82 QT Interval:  292 QTC Calculation: 439 R Axis:   76  Text Interpretation: Sinus tachycardia Cannot rule out Anterior infarct , age undetermined Abnormal ECG When compared with ECG of 12-Sep-2021 14:47, Vent. rate has increased BY  51 BPM Confirmed by Benjiman Core [phone]) on 09/26/2023 10:48:42 PM  Radiology DG Chest Port 1 View  Result Date: 09/26/2023 CLINICAL DATA:  Palpitations and shortness of breath. EXAM: PORTABLE CHEST 1 VIEW COMPARISON:  December 13, 2021 FINDINGS: The heart size and mediastinal contours are within normal limits. Low lung volumes are noted with mild, stable elevation of the right hemidiaphragm. Both lungs are clear. The visualized skeletal structures are unremarkable. IMPRESSION: No active disease. Electronically Signed   By: Aram Candela M.D.   On: 09/26/2023 22:15    Procedures Procedures    Medications Ordered in ED Medications  albuterol (VENTOLIN HFA) 108 (90 Base) MCG/ACT inhaler 2 puff (2 puffs Inhalation Given 09/26/23 2130)  AeroChamber Plus Flo-Vu Large MISC 1 each (has no administration in time range)  methylPREDNISolone sodium succinate (SOLU-MEDROL) 125 mg/2 mL injection 125 mg (125 mg Intravenous Given 09/26/23 1958)   ipratropium-albuterol (DUONEB) 0.5-2.5 (3) MG/3ML nebulizer solution 3 mL (3 mLs Nebulization Given 09/26/23 2105)    ED Course/ Medical Decision Making/ A&P                                 Medical Decision Making Amount and/or Complexity of Data Reviewed Labs: ordered. Radiology: ordered.  Risk Prescription drug management.   Patient shortness of breath and some cough.  Has had over a week now.  No fevers.  Differential diagnosis includes pneumonia, asthma, PE.  Will get basic blood work.  Will add D-dimer.  D-dimer is negative.  Chest x-ray shows no pneumothorax.  Radiology read pending.  Ambulated with some mild tachycardia but no hypoxia.  X-ray read by radiology as negative.  Tachycardia improved.  Will treat as URI.  Do not think we need viral testing at this time as he is out of the window of treatment.  Will discharge home with some steroids.        Final Clinical Impression(s) / ED Diagnoses Final diagnoses:  Dyspnea, unspecified type  Upper respiratory tract infection, unspecified type  Asthma, unspecified asthma severity, unspecified whether complicated, unspecified whether persistent    Rx / DC Orders ED Discharge Orders          Ordered    predniSONE (DELTASONE) 20 MG tablet  Daily        09/26/23 2246              Benjiman Core, MD 09/26/23 2249

## 2023-11-28 ENCOUNTER — Other Ambulatory Visit: Payer: Self-pay | Admitting: Student

## 2023-11-28 DIAGNOSIS — I10 Essential (primary) hypertension: Secondary | ICD-10-CM

## 2023-11-28 MED ORDER — NIFEDIPINE ER OSMOTIC RELEASE 30 MG PO TB24: 30.0000 mg | ORAL_TABLET | Freq: Every day | ORAL | 3 refills | Status: DC

## 2023-11-28 NOTE — Telephone Encounter (Signed)
Patient came in stating that he needs a refill on his Nifedipine please, he has been out for 2 days

## 2023-12-09 ENCOUNTER — Other Ambulatory Visit: Payer: Self-pay | Admitting: Student

## 2023-12-09 NOTE — Telephone Encounter (Signed)
Patient stated he is out of his BP medication and it was denied at the [facility] pharmacy.  Last OV 09/01/23

## 2023-12-09 NOTE — Telephone Encounter (Signed)
Patient came into front office regarding refill on BP medication. Called pharmacy. Patient was able to pick up Nifedipine, however, he needs refill on hydrochlorothiazide.   Pended to this encounter.   Veronda Prude, RN

## 2023-12-10 ENCOUNTER — Other Ambulatory Visit: Payer: Self-pay | Admitting: Student

## 2023-12-10 MED ORDER — HYDROCHLOROTHIAZIDE 12.5 MG PO TABS: 12.5000 mg | ORAL_TABLET | Freq: Every day | ORAL | 2 refills | Status: DC

## 2023-12-10 NOTE — Progress Notes (Signed)
Refilled HCTZ

## 2024-02-28 ENCOUNTER — Other Ambulatory Visit: Payer: Self-pay | Admitting: Student

## 2024-06-07 DIAGNOSIS — N342 Other urethritis: Secondary | ICD-10-CM | POA: Diagnosis not present

## 2024-06-07 DIAGNOSIS — Z113 Encounter for screening for infections with a predominantly sexual mode of transmission: Secondary | ICD-10-CM | POA: Diagnosis not present

## 2024-07-10 ENCOUNTER — Emergency Department (HOSPITAL_BASED_OUTPATIENT_CLINIC_OR_DEPARTMENT_OTHER)

## 2024-07-10 ENCOUNTER — Other Ambulatory Visit: Payer: Self-pay

## 2024-07-10 ENCOUNTER — Emergency Department (HOSPITAL_BASED_OUTPATIENT_CLINIC_OR_DEPARTMENT_OTHER)
Admission: EM | Admit: 2024-07-10 | Discharge: 2024-07-10 | Disposition: A | Discharge status: Home / Self Care | Attending: Emergency Medicine | Admitting: Emergency Medicine

## 2024-07-10 ENCOUNTER — Encounter (HOSPITAL_BASED_OUTPATIENT_CLINIC_OR_DEPARTMENT_OTHER): Payer: Self-pay

## 2024-07-10 DIAGNOSIS — M6210 Other rupture of muscle (nontraumatic), unspecified site: Secondary | ICD-10-CM | POA: Diagnosis not present

## 2024-07-10 DIAGNOSIS — S39011A Strain of muscle, fascia and tendon of abdomen, initial encounter: Secondary | ICD-10-CM | POA: Diagnosis not present

## 2024-07-10 DIAGNOSIS — R1032 Left lower quadrant pain: Secondary | ICD-10-CM | POA: Insufficient documentation

## 2024-07-10 DIAGNOSIS — T148XXA Other injury of unspecified body region, initial encounter: Secondary | ICD-10-CM

## 2024-07-10 DIAGNOSIS — N3289 Other specified disorders of bladder: Secondary | ICD-10-CM | POA: Diagnosis not present

## 2024-07-10 DIAGNOSIS — X500XXA Overexertion from strenuous movement or load, initial encounter: Secondary | ICD-10-CM | POA: Insufficient documentation

## 2024-07-10 DIAGNOSIS — N281 Cyst of kidney, acquired: Secondary | ICD-10-CM | POA: Diagnosis not present

## 2024-07-10 LAB — URINALYSIS, ROUTINE W REFLEX MICROSCOPIC
Bilirubin Urine: NEGATIVE
Glucose, UA: NEGATIVE mg/dL
Hgb urine dipstick: NEGATIVE
Ketones, ur: NEGATIVE mg/dL
Leukocytes,Ua: NEGATIVE
Nitrite: NEGATIVE
Protein, ur: NEGATIVE mg/dL
Specific Gravity, Urine: 1.02 (ref 1.005–1.030)
pH: 6.5 (ref 5.0–8.0)

## 2024-07-10 LAB — CBC WITH DIFFERENTIAL/PLATELET
Abs Immature Granulocytes: 0.01 K/uL (ref 0.00–0.07)
Basophils Absolute: 0 K/uL (ref 0.0–0.1)
Basophils Relative: 0 %
Eosinophils Absolute: 0.1 K/uL (ref 0.0–0.5)
Eosinophils Relative: 2 %
HCT: 43.4 % (ref 39.0–52.0)
Hemoglobin: 14.1 g/dL (ref 13.0–17.0)
Immature Granulocytes: 0 %
Lymphocytes Relative: 28 %
Lymphs Abs: 1.5 K/uL (ref 0.7–4.0)
MCH: 25.2 pg — ABNORMAL LOW (ref 26.0–34.0)
MCHC: 32.5 g/dL (ref 30.0–36.0)
MCV: 77.6 fL — ABNORMAL LOW (ref 80.0–100.0)
Monocytes Absolute: 0.6 K/uL (ref 0.1–1.0)
Monocytes Relative: 12 %
Neutro Abs: 3.1 K/uL (ref 1.7–7.7)
Neutrophils Relative %: 58 %
Platelets: 170 K/uL (ref 150–400)
RBC: 5.59 MIL/uL (ref 4.22–5.81)
RDW: 15.3 % (ref 11.5–15.5)
WBC: 5.3 K/uL (ref 4.0–10.5)
nRBC: 0 % (ref 0.0–0.2)

## 2024-07-10 LAB — COMPREHENSIVE METABOLIC PANEL WITH GFR
ALT: 66 U/L — ABNORMAL HIGH (ref 0–44)
AST: 41 U/L (ref 15–41)
Albumin: 4.3 g/dL (ref 3.5–5.0)
Alkaline Phosphatase: 82 U/L (ref 38–126)
Anion gap: 12 (ref 5–15)
BUN: 16 mg/dL (ref 6–20)
CO2: 23 mmol/L (ref 22–32)
Calcium: 10 mg/dL (ref 8.9–10.3)
Chloride: 102 mmol/L (ref 98–111)
Creatinine, Ser: 1.12 mg/dL (ref 0.61–1.24)
GFR, Estimated: >60 mL/min (ref >60)
Glucose, Bld: 99 mg/dL (ref 70–99)
Potassium: 3.8 mmol/L (ref 3.5–5.1)
Sodium: 136 mmol/L (ref 135–145)
Total Bilirubin: 0.5 mg/dL (ref 0.0–1.2)
Total Protein: 7.7 g/dL (ref 6.5–8.1)

## 2024-07-10 MED ORDER — KETOROLAC TROMETHAMINE 15 MG/ML IJ SOLN
15.0000 mg | Freq: Once | INTRAMUSCULAR | Status: AC
  Administered 2024-07-10: 15 mg via INTRAVENOUS
  Filled 2024-07-10: qty 1

## 2024-07-10 MED ORDER — CYCLOBENZAPRINE HCL 10 MG PO TABS: 10.0000 mg | ORAL_TABLET | Freq: Two times a day (BID) | ORAL | 0 refills | Status: DC | PRN

## 2024-07-10 MED ORDER — IOHEXOL 300 MG/ML  SOLN
100.0000 mL | Freq: Once | INTRAMUSCULAR | Status: AC | PRN
  Administered 2024-07-10: 100 mL via INTRAVENOUS

## 2024-07-10 NOTE — Discharge Instructions (Addendum)
 You were seen for your groin strain in the emergency department.   At home, please take tylenol  and ibuprofen  for your pain.  Please rest your joint and elevate it.  Use ice to limit the swelling.  You may use the cyclobenzaprine  (muscle relaxer) for pain as well.  It does make people drowsy so do not take it before driving or operating heavy machinery.  Check your MyChart online for the results of any tests that had not resulted by the time you left the emergency department.   Follow-up with orthopedics or sports medicine in 1 week.  Return immediately to the emergency department if you experience any of the following: Worsening pain, or any other concerning symptoms.    Thank you for visiting our Emergency Department. It was a pleasure taking care of you today.

## 2024-07-10 NOTE — ED Triage Notes (Signed)
 Pt states his symptoms started on Saturday. Is having left groin pain. Pt unsure if he pulled something, says he has been lifting weights lately. No hx of hernia.

## 2024-07-10 NOTE — ED Provider Notes (Signed)
 [place] EMERGENCY DEPARTMENT AT [facility] Provider Note   CSN: [id number] Arrival date & time: 07/10/24  1759     Patient presents with: Groin Pain   Scott Patterson is a 38 y.o. male.   38 year old male with no relevant past medical history presents emergency department with left lower quadrant abdominal pain.  Patient reports that on Saturday started experiencing left lower quadrant abdominal pain.  No known injuries.  Says initially it felt like a pulled muscle but is persisted.  Worse with movement.  Currently is severe.  Describes it as a cramping sensation.  No fevers or nausea or vomiting or diarrhea.  No testicular changes.  No dysuria or frequency.  No penile discharge.  No abdominal surgeries.  Says his significant other is a Engineer, civil (consulting) and wanted him to come in to be checked out for hernia.       Prior to Admission medications   Medication Sig Start Date End Date Taking? Authorizing Provider  cyclobenzaprine  (FLEXERIL ) 10 MG tablet Take 1 tablet (10 mg total) by mouth 2 (two) times daily as needed for muscle spasms. 07/10/24  Yes Yolande Lamar BROCKS, MD  acetaminophen  (TYLENOL ) 500 MG tablet Take 1,000 mg by mouth every 8 (eight) hours as needed.    [provider]  albuterol  (VENTOLIN  HFA) 108 (90 Base) MCG/ACT inhaler INHALE 1-2 PUFFS BY MOUTH EVERY 6 HOURS AS NEEDED FOR WHEEZE OR SHORTNESS OF BREATH 02/28/24   Rosendo Rush, MD  cetirizine  (ZYRTEC  ALLERGY) 10 MG tablet Take 1 tablet (10 mg total) by mouth daily. 02/12/23   Christopher Savannah, PA-C  colchicine  0.6 MG tablet TAKE 1 TABLET (0.6 MG TOTAL) BY MOUTH DAILY. ON THE FIRST DAY, TAKE 2 TABLETS, WAIT 1 HOUR, THEN TAKE ANOTHER 1 TABLET. THEN TAKE 1 TABLET DAILY UNTIL THE FLARE IMPROVES. 07/08/23   Rosendo Rush, MD  fluticasone  (FLONASE ) 50 MCG/ACT nasal spray PLACE 2 SPRAYS INTO BOTH NOSTRILS IN THE MORNING AND AT BEDTIME. 09/15/22   Lilland, Alana, DO  fluticasone  furoate-vilanterol (BREO ELLIPTA ) 200-25 MCG/ACT  AEPB Inhale 1 puff into the lungs daily. 07/08/22   Jeneal Danita Macintosh, MD  hydrochlorothiazide  (HYDRODIURIL ) 12.5 MG tablet Take 1 tablet (12.5 mg total) by mouth daily. 12/10/23   Rosendo Rush, MD  ketoconazole  (NIZORAL ) 2 % cream Apply 1 application. topically 2 (two) times daily. For 2 weeks. 05/06/22   Mahoney, Caitlin, MD  naproxen  (NAPROSYN ) 500 MG tablet Take 1 tablet (500 mg total) by mouth 2 (two) times daily with a meal. 05/24/23   Theophilus Pagan, MD  NIFEdipine  (PROCARDIA -XL/NIFEDICAL-XL) 30 MG 24 hr tablet Take 1 tablet (30 mg total) by mouth daily. 11/28/23   Rosendo Rush, MD  Olopatadine  HCl 0.2 % SOLN Apply 1 drop to eye daily. 07/08/22   Jeneal Danita Macintosh, MD  Olopatadine -Mometasone  (RYALTRIS ) [phone] MCG/ACT SUSP Place 2 sprays into the nose 2 (two) times daily. 07/08/22   Jeneal Danita Macintosh, MD  oxymetazoline  (AFRIN NASAL SPRAY) 0.05 % nasal spray Place 1 spray into both nostrils 2 (two) times daily. 04/14/22   Hope Merle, MD  predniSONE  (DELTASONE ) 20 MG tablet Take 2 tablets (40 mg total) by mouth daily. 09/26/23   Patsey Lot, MD  pseudoephedrine  (SUDAFED) 30 MG tablet Take 1 tablet (30 mg total) by mouth every 8 (eight) hours as needed for congestion. 02/12/23   Christopher Savannah, PA-C  Spacer/Aero-Hold Chamber Bags MISC Use with inhaler 03/22/19   Delores Suzann HERO, MD  tiZANidine  (ZANAFLEX ) 4 MG tablet TAKE  1 TABLET BY MOUTH EVERYDAY AT BEDTIME 08/18/23   Marlee Lynwood NOVAK, MD    Allergies: Patient has no known allergies.    Review of Systems  Updated Vital Signs BP (!) 136/96   Pulse 84   Temp 98.2 F (36.8 C)   Resp 16   Ht 6' 2 (1.88 m)   Wt 124.7 kg   SpO2 97%   BMI 35.31 kg/m   Physical Exam Vitals and nursing note reviewed.  Constitutional:      General: He is not in acute distress.    Appearance: He is well-developed.  HENT:     Head: Normocephalic and atraumatic.     Right Ear: External ear normal.     Left Ear: External ear normal.      Nose: Nose normal.  Eyes:     Extraocular Movements: Extraocular movements intact.     Conjunctiva/sclera: Conjunctivae normal.     Pupils: Pupils are equal, round, and reactive to light.  Abdominal:     General: There is no distension.     Palpations: Abdomen is soft. There is no mass.     Tenderness: There is abdominal tenderness (Left lower quadrant). There is no guarding.     Comments: No hernia appreciated  Genitourinary:    Penis: Normal.      Testes: Normal.  Musculoskeletal:     Cervical back: Normal range of motion and neck supple.  Skin:    General: Skin is warm and dry.  Neurological:     Mental Status: He is alert. Mental status is at baseline.     (all labs ordered are listed, but only abnormal results are displayed) Labs Reviewed  COMPREHENSIVE METABOLIC PANEL WITH GFR - Abnormal; Notable for the following components:      Result Value   ALT 66 (*)    All other components within normal limits  CBC WITH DIFFERENTIAL/PLATELET - Abnormal; Notable for the following components:   MCV 77.6 (*)    MCH 25.2 (*)    All other components within normal limits  URINALYSIS, ROUTINE W REFLEX MICROSCOPIC    EKG: None  Radiology: CT ABDOMEN PELVIS W CONTRAST Result Date: 07/10/2024 CLINICAL DATA:  Left lower quadrant pain EXAM: CT ABDOMEN AND PELVIS WITH CONTRAST TECHNIQUE: Multidetector CT imaging of the abdomen and pelvis was performed using the standard protocol following bolus administration of intravenous contrast. RADIATION DOSE REDUCTION: This exam was performed according to the departmental dose-optimization program which includes automated exposure control, adjustment of the mA and/or kV according to patient size and/or use of iterative reconstruction technique. CONTRAST:  OMNIPAQUE  IOHEXOL  300 MG/ML  SOLN COMPARISON:  None Available. FINDINGS: Lower chest: No acute abnormality. Hepatobiliary: No focal liver abnormality is seen. No gallstones, gallbladder wall  thickening, or biliary dilatation. Pancreas: Unremarkable. No pancreatic ductal dilatation or surrounding inflammatory changes. Spleen: Normal in size without focal abnormality. Adrenals/Urinary Tract: Adrenal glands are within normal limits. Kidneys demonstrate a normal enhancement pattern bilaterally. Simple renal cyst is noted centrally in the right kidney. No follow-up is recommended. No calculi or obstructive changes are seen. The bladder is partially distended. Stomach/Bowel: No obstructive or inflammatory changes of the colon are noted. The appendix is within normal limits. Small bowel and stomach are unremarkable. Vascular/Lymphatic: No significant vascular findings are present. No enlarged abdominal or pelvic lymph nodes. Reproductive: Prostate is unremarkable. Other: No abdominal wall hernia or abnormality. No abdominopelvic ascites. Musculoskeletal: No acute or significant osseous findings. IMPRESSION: No acute abnormality is  identified to correspond with the given clinical history. Electronically Signed   By: Oneil Devonshire M.D.   On: 07/10/2024 19:58     Procedures   Medications Ordered in the ED  ketorolac  (TORADOL ) 15 MG/ML injection 15 mg (15 mg Intravenous Given 07/10/24 1841)  iohexol  (OMNIPAQUE ) 300 MG/ML solution 100 mL (100 mLs Intravenous Contrast Given 07/10/24 1943)                                    Medical Decision Making Amount and/or Complexity of Data Reviewed Labs: ordered. Radiology: ordered.  Risk Prescription drug management.   38 year old male who presents emergency department with left lower quadrant abdominal pain  Initial Ddx:  Muscle strain, hernia, diverticulitis, testicular pathology/torsion  MDM/Course:  Patient presents emergency department with left groin pain.  Appears to be worse with movement.  Says that he lifts weights and may have pulled a muscle but does not recall specific injury.  Does have some left lower quadrant tenderness to palpation  on exam.  No testicular abnormality or tenderness palpation.  Underwent CT scan did not show acute pathology.  Suspect his symptoms likely are due to muscle strain.  Discharged home with instructions to take Tylenol  and ibuprofen .  Given cyclobenzaprine  prescription as well.   This patient presents to the ED for concern of complaints listed in HPI, this involves an extensive number of treatment options, and is a complaint that carries with it a high risk of complications and morbidity. Disposition including potential need for admission considered.   Dispo: DC Home. Return precautions discussed including, but not limited to, those listed in the AVS. Allowed pt time to ask questions which were answered fully prior to dc.  Records reviewed Outpatient Clinic Notes I independently reviewed the following imaging with scope of interpretation limited to determining acute life threatening conditions related to emergency care: CT Abdomen/Pelvis and agree with the radiologist interpretation with the following exceptions: none I personally reviewed and interpreted cardiac monitoring: normal sinus rhythm  I personally reviewed and interpreted the pt's EKG: see above for interpretation  I have reviewed the patients home medications and made adjustments as needed  Portions of this note were generated with Dragon dictation software. Dictation errors may occur despite best attempts at proofreading.     Final diagnoses:  Muscle strain  Left lower quadrant abdominal pain    ED Discharge Orders          Ordered    cyclobenzaprine  (FLEXERIL ) 10 MG tablet  2 times daily PRN        07/10/24 2004               Yolande Lamar BROCKS, MD 07/10/24 2059

## 2024-07-30 DIAGNOSIS — L02416 Cutaneous abscess of left lower limb: Secondary | ICD-10-CM | POA: Diagnosis not present

## 2024-08-03 ENCOUNTER — Encounter: Payer: Self-pay | Admitting: Student

## 2024-08-03 ENCOUNTER — Ambulatory Visit (INDEPENDENT_AMBULATORY_CARE_PROVIDER_SITE_OTHER): Admitting: Student

## 2024-08-03 VITALS — BP 140/95 | HR 78 | Ht 74.0 in | Wt 298.0 lb

## 2024-08-03 DIAGNOSIS — S161XXD Strain of muscle, fascia and tendon at neck level, subsequent encounter: Secondary | ICD-10-CM

## 2024-08-03 DIAGNOSIS — M109 Gout, unspecified: Secondary | ICD-10-CM

## 2024-08-03 DIAGNOSIS — I10 Essential (primary) hypertension: Secondary | ICD-10-CM

## 2024-08-03 MED ORDER — TIZANIDINE HCL 4 MG PO TABS
4.0000 mg | ORAL_TABLET | Freq: Every day | ORAL | 1 refills | Status: AC
Start: 2024-08-03 — End: 2024-10-02

## 2024-08-03 MED ORDER — ALLOPURINOL 100 MG PO TABS: 100.0000 mg | ORAL_TABLET | Freq: Every day | ORAL | 6 refills | Status: AC

## 2024-08-03 MED ORDER — NAPROXEN 500 MG PO TABS: 500.0000 mg | ORAL_TABLET | Freq: Two times a day (BID) | ORAL | 0 refills | Status: AC

## 2024-08-03 MED ORDER — COLCHICINE 0.6 MG PO TABS: 0.6000 mg | ORAL_TABLET | Freq: Every day | ORAL | 1 refills | Status: DC

## 2024-08-03 MED ORDER — HYDROCHLOROTHIAZIDE 25 MG PO TABS: 25.0000 mg | ORAL_TABLET | Freq: Every day | ORAL | 0 refills | Status: DC

## 2024-08-03 NOTE — Assessment & Plan Note (Signed)
 Gout with intermittent foot pain and swelling, triggered by certain foods. Pain location unclear, other causes possible. Previous medication effective during flare-ups. -RX Colchiceine 1.2 mg followed by 0.6 mg in one hour - Refilled naproxen  - Started allopurinol  100mg  daily for prophylaxis to be started 2 weeks

## 2024-08-03 NOTE — Assessment & Plan Note (Signed)
 BP elevated and poorly controlled, currently on nifedipine  and HCTZ 12.5 - Continue nifedipine  - Increased HCTZ to 25 mg daily - Encourage keeping daily blood pressure log and following up in 2 weeks to reassess.

## 2024-08-03 NOTE — Assessment & Plan Note (Signed)
 Chronic neck pain for over a year, muscular in nature, exacerbated by lifting weights. Previous treatments provided limited relief. Differential includes muscle strain from repetitive actions or poor posture. Imaging not necessary currently. - Refilled Flexeril  and naproxen . - Recommended Voltaren  gel for topical use. - Referred to physical therapy. - Consider imaging if no improvement.

## 2024-08-03 NOTE — Patient Instructions (Addendum)
 Pleasure to meet you today.  For your neck pain I think this is muscle strain.  I have sent in refill of your tizanidine  which is a muscle relaxant for the neck pain I recommend taking this mostly at night.  I have also sent in naproxen  which should help with some of the pain you are having.  You can also get over-the-counter Voltaren  gel to be applied on the neck area that is hurting.  I have also placed referral to physical therapy to help with the neck pain.  I have sent in refill for your colchicine  for your gout.  Please take your medication as prescribed.  I have also sent in medication for allopurinol  which you will start taking in 2 weeks after taking your colchicine .  To be a daily medication you are going to reduce your risk of gout flareup.  For over-the-counter wart treatment you can look for Compound W which is over-the-counter medication.  Your blood pressure today is elevated I have increased your hydrochlorothiazide  from 12.5 to 25 mg daily.  Please make sure you are checking your blood pressure daily and if it is over 140/980 consistently please return to be assessed in 2 weeks.

## 2024-08-03 NOTE — Progress Notes (Signed)
    SUBJECTIVE:   CHIEF COMPLAINT / HPI:   Scott Patterson is a 38 year old male who presents with neck pain and a request for medication refill.  He experiences constant neck pain radiating to the shoulder, exacerbated by weight lifting. Previous treatments with naproxen  and tizanidine  provided limited relief. He suspects poor sleep quality may be related to his sleeping position.  He has gout with recent flare-ups causing foot soreness, particularly after consuming foods like salmon. Dietary modifications to avoid shellfish and asparagus have reduced flare-ups. He requests a refill of his gout medication.  PERTINENT  PMH / PSH: Reviewed   OBJECTIVE:   BP (!) 140/95 Comment: pt was standing  Pulse 78   Ht 6' 2 (1.88 m)   Wt 298 lb (135.2 kg)   SpO2 99%   BMI 38.26 kg/m    Physical Exam General: Alert, well appearing, NAD Neck: No mass, mild tenderness with palpation on the left lateral side. Limited lateral rotation to the left Cardiovascular: RRR, No Murmurs, Normal S2/S2 Respiratory: CTAB, No wheezing or Rales Abdomen: No distension or tenderness Extremities: No edema on extremities   Skin: Warm and dry  ASSESSMENT/PLAN:   Neck strain Chronic neck pain for over a year, muscular in nature, exacerbated by lifting weights. Previous treatments provided limited relief. Differential includes muscle strain from repetitive actions or poor posture. Imaging not necessary currently. - Refilled Flexeril  and naproxen . - Recommended Voltaren  gel for topical use. - Referred to physical therapy. - Consider imaging if no improvement.  Gout flare Gout with intermittent foot pain and swelling, triggered by certain foods. Pain location unclear, other causes possible. Previous medication effective during flare-ups. -RX Colchiceine 1.2 mg followed by 0.6 mg in one hour - Refilled naproxen  - Started allopurinol  100mg  daily for prophylaxis to be started 2 weeks  Essential hypertension,  benign BP elevated and poorly controlled, currently on nifedipine  and HCTZ 12.5 - Continue nifedipine  - Increased HCTZ to 25 mg daily - Encourage keeping daily blood pressure log and following up in 2 weeks to reassess.    Norleen April, MD [facility]

## 2024-08-06 DIAGNOSIS — J45909 Unspecified asthma, uncomplicated: Secondary | ICD-10-CM | POA: Diagnosis not present

## 2024-08-06 DIAGNOSIS — Z13220 Encounter for screening for lipoid disorders: Secondary | ICD-10-CM | POA: Diagnosis not present

## 2024-08-06 DIAGNOSIS — R635 Abnormal weight gain: Secondary | ICD-10-CM | POA: Diagnosis not present

## 2024-08-06 DIAGNOSIS — Z131 Encounter for screening for diabetes mellitus: Secondary | ICD-10-CM | POA: Diagnosis not present

## 2024-08-06 DIAGNOSIS — I10 Essential (primary) hypertension: Secondary | ICD-10-CM | POA: Diagnosis not present

## 2024-08-07 DIAGNOSIS — M546 Pain in thoracic spine: Secondary | ICD-10-CM | POA: Diagnosis not present

## 2024-08-07 DIAGNOSIS — M9901 Segmental and somatic dysfunction of cervical region: Secondary | ICD-10-CM | POA: Diagnosis not present

## 2024-08-07 DIAGNOSIS — M25512 Pain in left shoulder: Secondary | ICD-10-CM | POA: Diagnosis not present

## 2024-08-07 DIAGNOSIS — M7912 Myalgia of auxiliary muscles, head and neck: Secondary | ICD-10-CM | POA: Diagnosis not present

## 2024-08-07 DIAGNOSIS — M5459 Other low back pain: Secondary | ICD-10-CM | POA: Diagnosis not present

## 2024-08-07 DIAGNOSIS — M25612 Stiffness of left shoulder, not elsewhere classified: Secondary | ICD-10-CM | POA: Diagnosis not present

## 2024-08-07 DIAGNOSIS — M7918 Myalgia, other site: Secondary | ICD-10-CM | POA: Diagnosis not present

## 2024-08-07 DIAGNOSIS — M9903 Segmental and somatic dysfunction of lumbar region: Secondary | ICD-10-CM | POA: Diagnosis not present

## 2024-08-07 DIAGNOSIS — M9905 Segmental and somatic dysfunction of pelvic region: Secondary | ICD-10-CM | POA: Diagnosis not present

## 2024-08-07 DIAGNOSIS — M542 Cervicalgia: Secondary | ICD-10-CM | POA: Diagnosis not present

## 2024-08-07 DIAGNOSIS — M9902 Segmental and somatic dysfunction of thoracic region: Secondary | ICD-10-CM | POA: Diagnosis not present

## 2024-08-14 DIAGNOSIS — M9903 Segmental and somatic dysfunction of lumbar region: Secondary | ICD-10-CM | POA: Diagnosis not present

## 2024-08-14 DIAGNOSIS — M25512 Pain in left shoulder: Secondary | ICD-10-CM | POA: Diagnosis not present

## 2024-08-14 DIAGNOSIS — M7912 Myalgia of auxiliary muscles, head and neck: Secondary | ICD-10-CM | POA: Diagnosis not present

## 2024-08-14 DIAGNOSIS — M9902 Segmental and somatic dysfunction of thoracic region: Secondary | ICD-10-CM | POA: Diagnosis not present

## 2024-08-14 DIAGNOSIS — M5459 Other low back pain: Secondary | ICD-10-CM | POA: Diagnosis not present

## 2024-08-14 DIAGNOSIS — M542 Cervicalgia: Secondary | ICD-10-CM | POA: Diagnosis not present

## 2024-08-14 DIAGNOSIS — M7918 Myalgia, other site: Secondary | ICD-10-CM | POA: Diagnosis not present

## 2024-08-14 DIAGNOSIS — M546 Pain in thoracic spine: Secondary | ICD-10-CM | POA: Diagnosis not present

## 2024-08-14 DIAGNOSIS — M25612 Stiffness of left shoulder, not elsewhere classified: Secondary | ICD-10-CM | POA: Diagnosis not present

## 2024-08-14 DIAGNOSIS — M9905 Segmental and somatic dysfunction of pelvic region: Secondary | ICD-10-CM | POA: Diagnosis not present

## 2024-08-14 DIAGNOSIS — M9901 Segmental and somatic dysfunction of cervical region: Secondary | ICD-10-CM | POA: Diagnosis not present

## 2024-08-21 ENCOUNTER — Other Ambulatory Visit: Payer: Self-pay | Admitting: Student

## 2024-08-21 ENCOUNTER — Ambulatory Visit: Admitting: Student

## 2024-08-27 ENCOUNTER — Other Ambulatory Visit: Payer: Self-pay | Admitting: Student

## 2024-08-27 DIAGNOSIS — I10 Essential (primary) hypertension: Secondary | ICD-10-CM

## 2024-08-27 DIAGNOSIS — M109 Gout, unspecified: Secondary | ICD-10-CM

## 2024-09-01 ENCOUNTER — Other Ambulatory Visit: Payer: Self-pay | Admitting: Student

## 2024-09-01 DIAGNOSIS — S161XXD Strain of muscle, fascia and tendon at neck level, subsequent encounter: Secondary | ICD-10-CM

## 2024-09-08 ENCOUNTER — Other Ambulatory Visit: Payer: Self-pay | Admitting: Student

## 2024-09-10 ENCOUNTER — Other Ambulatory Visit: Payer: Self-pay | Admitting: Student

## 2024-09-10 NOTE — Telephone Encounter (Signed)
 Patient calls nurse line regarding hydrochlorothiazide . He does not feel that he needs the 25 mg as he has been on 12.5 mg for years and this has worked well for him.   He states that his BP was elevated at his last visit because he had a headache and did not sleep well the night before.   I also informed patient that he was supposed to have follow up 2 weeks after his visit on 8/22.   He is requesting a new prescription for 12.5 mg as he is now out of medication.   Please advise.   Chiquita JAYSON English, RN

## 2024-09-12 DIAGNOSIS — M25512 Pain in left shoulder: Secondary | ICD-10-CM | POA: Diagnosis not present

## 2024-09-12 DIAGNOSIS — M7918 Myalgia, other site: Secondary | ICD-10-CM | POA: Diagnosis not present

## 2024-09-12 DIAGNOSIS — M542 Cervicalgia: Secondary | ICD-10-CM | POA: Diagnosis not present

## 2024-09-12 DIAGNOSIS — M9902 Segmental and somatic dysfunction of thoracic region: Secondary | ICD-10-CM | POA: Diagnosis not present

## 2024-09-12 DIAGNOSIS — M7912 Myalgia of auxiliary muscles, head and neck: Secondary | ICD-10-CM | POA: Diagnosis not present

## 2024-09-12 DIAGNOSIS — M9901 Segmental and somatic dysfunction of cervical region: Secondary | ICD-10-CM | POA: Diagnosis not present

## 2024-09-12 DIAGNOSIS — M25612 Stiffness of left shoulder, not elsewhere classified: Secondary | ICD-10-CM | POA: Diagnosis not present

## 2024-09-12 DIAGNOSIS — M546 Pain in thoracic spine: Secondary | ICD-10-CM | POA: Diagnosis not present

## 2024-09-12 DIAGNOSIS — M5459 Other low back pain: Secondary | ICD-10-CM | POA: Diagnosis not present

## 2024-09-12 DIAGNOSIS — M9905 Segmental and somatic dysfunction of pelvic region: Secondary | ICD-10-CM | POA: Diagnosis not present

## 2024-09-12 DIAGNOSIS — M9903 Segmental and somatic dysfunction of lumbar region: Secondary | ICD-10-CM | POA: Diagnosis not present

## 2024-09-13 DIAGNOSIS — R635 Abnormal weight gain: Secondary | ICD-10-CM | POA: Diagnosis not present

## 2024-09-13 DIAGNOSIS — J45909 Unspecified asthma, uncomplicated: Secondary | ICD-10-CM | POA: Diagnosis not present

## 2024-09-13 DIAGNOSIS — R7989 Other specified abnormal findings of blood chemistry: Secondary | ICD-10-CM | POA: Diagnosis not present

## 2024-09-13 DIAGNOSIS — R7303 Prediabetes: Secondary | ICD-10-CM | POA: Diagnosis not present

## 2024-09-13 DIAGNOSIS — Z9189 Other specified personal risk factors, not elsewhere classified: Secondary | ICD-10-CM | POA: Diagnosis not present

## 2024-09-13 DIAGNOSIS — Z0000 Encounter for general adult medical examination without abnormal findings: Secondary | ICD-10-CM | POA: Diagnosis not present

## 2024-09-13 DIAGNOSIS — R519 Headache, unspecified: Secondary | ICD-10-CM | POA: Diagnosis not present

## 2024-09-13 DIAGNOSIS — D72818 Other decreased white blood cell count: Secondary | ICD-10-CM | POA: Diagnosis not present

## 2024-09-18 DIAGNOSIS — M5459 Other low back pain: Secondary | ICD-10-CM | POA: Diagnosis not present

## 2024-09-18 DIAGNOSIS — M9905 Segmental and somatic dysfunction of pelvic region: Secondary | ICD-10-CM | POA: Diagnosis not present

## 2024-09-18 DIAGNOSIS — M7918 Myalgia, other site: Secondary | ICD-10-CM | POA: Diagnosis not present

## 2024-09-18 DIAGNOSIS — M9903 Segmental and somatic dysfunction of lumbar region: Secondary | ICD-10-CM | POA: Diagnosis not present

## 2024-09-18 DIAGNOSIS — M546 Pain in thoracic spine: Secondary | ICD-10-CM | POA: Diagnosis not present

## 2024-09-18 DIAGNOSIS — M542 Cervicalgia: Secondary | ICD-10-CM | POA: Diagnosis not present

## 2024-09-18 DIAGNOSIS — M25512 Pain in left shoulder: Secondary | ICD-10-CM | POA: Diagnosis not present

## 2024-09-18 DIAGNOSIS — M9902 Segmental and somatic dysfunction of thoracic region: Secondary | ICD-10-CM | POA: Diagnosis not present

## 2024-09-18 DIAGNOSIS — M9901 Segmental and somatic dysfunction of cervical region: Secondary | ICD-10-CM | POA: Diagnosis not present

## 2024-09-18 DIAGNOSIS — M25612 Stiffness of left shoulder, not elsewhere classified: Secondary | ICD-10-CM | POA: Diagnosis not present

## 2024-09-18 DIAGNOSIS — M7912 Myalgia of auxiliary muscles, head and neck: Secondary | ICD-10-CM | POA: Diagnosis not present

## 2024-09-20 DIAGNOSIS — J302 Other seasonal allergic rhinitis: Secondary | ICD-10-CM | POA: Diagnosis not present

## 2024-09-20 DIAGNOSIS — R7303 Prediabetes: Secondary | ICD-10-CM | POA: Diagnosis not present

## 2024-09-20 DIAGNOSIS — J45909 Unspecified asthma, uncomplicated: Secondary | ICD-10-CM | POA: Diagnosis not present

## 2024-09-20 DIAGNOSIS — R7989 Other specified abnormal findings of blood chemistry: Secondary | ICD-10-CM | POA: Diagnosis not present

## 2024-09-20 DIAGNOSIS — I10 Essential (primary) hypertension: Secondary | ICD-10-CM | POA: Diagnosis not present

## 2024-09-20 DIAGNOSIS — R519 Headache, unspecified: Secondary | ICD-10-CM | POA: Diagnosis not present

## 2024-09-20 DIAGNOSIS — R635 Abnormal weight gain: Secondary | ICD-10-CM | POA: Diagnosis not present

## 2024-09-20 DIAGNOSIS — Z9189 Other specified personal risk factors, not elsewhere classified: Secondary | ICD-10-CM | POA: Diagnosis not present

## 2024-09-25 ENCOUNTER — Other Ambulatory Visit: Payer: Self-pay | Admitting: Student

## 2024-09-25 DIAGNOSIS — S161XXD Strain of muscle, fascia and tendon at neck level, subsequent encounter: Secondary | ICD-10-CM

## 2024-10-01 DIAGNOSIS — M9903 Segmental and somatic dysfunction of lumbar region: Secondary | ICD-10-CM | POA: Diagnosis not present

## 2024-10-01 DIAGNOSIS — M7918 Myalgia, other site: Secondary | ICD-10-CM | POA: Diagnosis not present

## 2024-10-01 DIAGNOSIS — M25612 Stiffness of left shoulder, not elsewhere classified: Secondary | ICD-10-CM | POA: Diagnosis not present

## 2024-10-01 DIAGNOSIS — M5459 Other low back pain: Secondary | ICD-10-CM | POA: Diagnosis not present

## 2024-10-01 DIAGNOSIS — M546 Pain in thoracic spine: Secondary | ICD-10-CM | POA: Diagnosis not present

## 2024-10-01 DIAGNOSIS — M25512 Pain in left shoulder: Secondary | ICD-10-CM | POA: Diagnosis not present

## 2024-10-01 DIAGNOSIS — D72818 Other decreased white blood cell count: Secondary | ICD-10-CM | POA: Diagnosis not present

## 2024-10-01 DIAGNOSIS — M542 Cervicalgia: Secondary | ICD-10-CM | POA: Diagnosis not present

## 2024-10-01 DIAGNOSIS — M7912 Myalgia of auxiliary muscles, head and neck: Secondary | ICD-10-CM | POA: Diagnosis not present

## 2024-10-01 DIAGNOSIS — M9901 Segmental and somatic dysfunction of cervical region: Secondary | ICD-10-CM | POA: Diagnosis not present

## 2024-10-01 DIAGNOSIS — M9905 Segmental and somatic dysfunction of pelvic region: Secondary | ICD-10-CM | POA: Diagnosis not present

## 2024-10-01 DIAGNOSIS — R7303 Prediabetes: Secondary | ICD-10-CM | POA: Diagnosis not present

## 2024-10-01 DIAGNOSIS — M9902 Segmental and somatic dysfunction of thoracic region: Secondary | ICD-10-CM | POA: Diagnosis not present

## 2024-10-04 ENCOUNTER — Other Ambulatory Visit: Payer: Self-pay | Admitting: Student

## 2024-10-04 DIAGNOSIS — M109 Gout, unspecified: Secondary | ICD-10-CM

## 2024-10-08 DIAGNOSIS — M25612 Stiffness of left shoulder, not elsewhere classified: Secondary | ICD-10-CM | POA: Diagnosis not present

## 2024-10-08 DIAGNOSIS — M5459 Other low back pain: Secondary | ICD-10-CM | POA: Diagnosis not present

## 2024-10-08 DIAGNOSIS — M546 Pain in thoracic spine: Secondary | ICD-10-CM | POA: Diagnosis not present

## 2024-10-08 DIAGNOSIS — M9905 Segmental and somatic dysfunction of pelvic region: Secondary | ICD-10-CM | POA: Diagnosis not present

## 2024-10-08 DIAGNOSIS — M25512 Pain in left shoulder: Secondary | ICD-10-CM | POA: Diagnosis not present

## 2024-10-08 DIAGNOSIS — M9902 Segmental and somatic dysfunction of thoracic region: Secondary | ICD-10-CM | POA: Diagnosis not present

## 2024-10-08 DIAGNOSIS — M7918 Myalgia, other site: Secondary | ICD-10-CM | POA: Diagnosis not present

## 2024-10-08 DIAGNOSIS — M7912 Myalgia of auxiliary muscles, head and neck: Secondary | ICD-10-CM | POA: Diagnosis not present

## 2024-10-08 DIAGNOSIS — M9903 Segmental and somatic dysfunction of lumbar region: Secondary | ICD-10-CM | POA: Diagnosis not present

## 2024-10-08 DIAGNOSIS — M9901 Segmental and somatic dysfunction of cervical region: Secondary | ICD-10-CM | POA: Diagnosis not present

## 2024-10-08 DIAGNOSIS — M542 Cervicalgia: Secondary | ICD-10-CM | POA: Diagnosis not present

## 2024-10-09 DIAGNOSIS — L739 Follicular disorder, unspecified: Secondary | ICD-10-CM | POA: Diagnosis not present

## 2024-10-09 DIAGNOSIS — L02414 Cutaneous abscess of left upper limb: Secondary | ICD-10-CM | POA: Diagnosis not present

## 2024-10-11 DIAGNOSIS — J302 Other seasonal allergic rhinitis: Secondary | ICD-10-CM | POA: Diagnosis not present

## 2024-10-11 DIAGNOSIS — J45909 Unspecified asthma, uncomplicated: Secondary | ICD-10-CM | POA: Diagnosis not present

## 2024-10-11 DIAGNOSIS — R7303 Prediabetes: Secondary | ICD-10-CM | POA: Diagnosis not present

## 2024-10-11 DIAGNOSIS — R519 Headache, unspecified: Secondary | ICD-10-CM | POA: Diagnosis not present

## 2024-10-11 DIAGNOSIS — I10 Essential (primary) hypertension: Secondary | ICD-10-CM | POA: Diagnosis not present

## 2024-10-11 DIAGNOSIS — Z9189 Other specified personal risk factors, not elsewhere classified: Secondary | ICD-10-CM | POA: Diagnosis not present

## 2024-10-15 DIAGNOSIS — M7918 Myalgia, other site: Secondary | ICD-10-CM | POA: Diagnosis not present

## 2024-10-15 DIAGNOSIS — M546 Pain in thoracic spine: Secondary | ICD-10-CM | POA: Diagnosis not present

## 2024-10-15 DIAGNOSIS — M542 Cervicalgia: Secondary | ICD-10-CM | POA: Diagnosis not present

## 2024-10-15 DIAGNOSIS — M9901 Segmental and somatic dysfunction of cervical region: Secondary | ICD-10-CM | POA: Diagnosis not present

## 2024-10-15 DIAGNOSIS — M7912 Myalgia of auxiliary muscles, head and neck: Secondary | ICD-10-CM | POA: Diagnosis not present

## 2024-10-15 DIAGNOSIS — R0683 Snoring: Secondary | ICD-10-CM | POA: Diagnosis not present

## 2024-10-15 DIAGNOSIS — M25512 Pain in left shoulder: Secondary | ICD-10-CM | POA: Diagnosis not present

## 2024-10-15 DIAGNOSIS — M5459 Other low back pain: Secondary | ICD-10-CM | POA: Diagnosis not present

## 2024-10-15 DIAGNOSIS — M25612 Stiffness of left shoulder, not elsewhere classified: Secondary | ICD-10-CM | POA: Diagnosis not present

## 2024-10-15 DIAGNOSIS — M9903 Segmental and somatic dysfunction of lumbar region: Secondary | ICD-10-CM | POA: Diagnosis not present

## 2024-10-15 DIAGNOSIS — M9905 Segmental and somatic dysfunction of pelvic region: Secondary | ICD-10-CM | POA: Diagnosis not present

## 2024-10-15 DIAGNOSIS — I10 Essential (primary) hypertension: Secondary | ICD-10-CM | POA: Diagnosis not present

## 2024-10-15 DIAGNOSIS — E669 Obesity, unspecified: Secondary | ICD-10-CM | POA: Diagnosis not present

## 2024-10-15 DIAGNOSIS — M9902 Segmental and somatic dysfunction of thoracic region: Secondary | ICD-10-CM | POA: Diagnosis not present

## 2024-10-22 DIAGNOSIS — M546 Pain in thoracic spine: Secondary | ICD-10-CM | POA: Diagnosis not present

## 2024-10-22 DIAGNOSIS — M9905 Segmental and somatic dysfunction of pelvic region: Secondary | ICD-10-CM | POA: Diagnosis not present

## 2024-10-22 DIAGNOSIS — M7918 Myalgia, other site: Secondary | ICD-10-CM | POA: Diagnosis not present

## 2024-10-22 DIAGNOSIS — M5459 Other low back pain: Secondary | ICD-10-CM | POA: Diagnosis not present

## 2024-10-22 DIAGNOSIS — M542 Cervicalgia: Secondary | ICD-10-CM | POA: Diagnosis not present

## 2024-10-22 DIAGNOSIS — M9901 Segmental and somatic dysfunction of cervical region: Secondary | ICD-10-CM | POA: Diagnosis not present

## 2024-10-22 DIAGNOSIS — M25512 Pain in left shoulder: Secondary | ICD-10-CM | POA: Diagnosis not present

## 2024-10-22 DIAGNOSIS — M9903 Segmental and somatic dysfunction of lumbar region: Secondary | ICD-10-CM | POA: Diagnosis not present

## 2024-10-22 DIAGNOSIS — M9902 Segmental and somatic dysfunction of thoracic region: Secondary | ICD-10-CM | POA: Diagnosis not present

## 2024-10-22 DIAGNOSIS — M25612 Stiffness of left shoulder, not elsewhere classified: Secondary | ICD-10-CM | POA: Diagnosis not present

## 2024-10-22 DIAGNOSIS — M7912 Myalgia of auxiliary muscles, head and neck: Secondary | ICD-10-CM | POA: Diagnosis not present

## 2024-10-29 DIAGNOSIS — M5459 Other low back pain: Secondary | ICD-10-CM | POA: Diagnosis not present

## 2024-10-29 DIAGNOSIS — M9901 Segmental and somatic dysfunction of cervical region: Secondary | ICD-10-CM | POA: Diagnosis not present

## 2024-10-29 DIAGNOSIS — M542 Cervicalgia: Secondary | ICD-10-CM | POA: Diagnosis not present

## 2024-10-29 DIAGNOSIS — M9902 Segmental and somatic dysfunction of thoracic region: Secondary | ICD-10-CM | POA: Diagnosis not present

## 2024-10-29 DIAGNOSIS — M7918 Myalgia, other site: Secondary | ICD-10-CM | POA: Diagnosis not present

## 2024-10-29 DIAGNOSIS — M546 Pain in thoracic spine: Secondary | ICD-10-CM | POA: Diagnosis not present

## 2024-10-29 DIAGNOSIS — M25512 Pain in left shoulder: Secondary | ICD-10-CM | POA: Diagnosis not present

## 2024-10-29 DIAGNOSIS — M7912 Myalgia of auxiliary muscles, head and neck: Secondary | ICD-10-CM | POA: Diagnosis not present

## 2024-10-29 DIAGNOSIS — M9903 Segmental and somatic dysfunction of lumbar region: Secondary | ICD-10-CM | POA: Diagnosis not present

## 2024-10-29 DIAGNOSIS — M25612 Stiffness of left shoulder, not elsewhere classified: Secondary | ICD-10-CM | POA: Diagnosis not present

## 2024-10-29 DIAGNOSIS — M9905 Segmental and somatic dysfunction of pelvic region: Secondary | ICD-10-CM | POA: Diagnosis not present

## 2024-10-31 ENCOUNTER — Other Ambulatory Visit: Payer: Self-pay

## 2024-11-01 ENCOUNTER — Other Ambulatory Visit: Payer: Self-pay

## 2024-11-01 DIAGNOSIS — M109 Gout, unspecified: Secondary | ICD-10-CM

## 2024-11-03 ENCOUNTER — Other Ambulatory Visit: Payer: Self-pay | Admitting: Student

## 2024-11-03 DIAGNOSIS — I10 Essential (primary) hypertension: Secondary | ICD-10-CM

## 2024-11-05 DIAGNOSIS — M7912 Myalgia of auxiliary muscles, head and neck: Secondary | ICD-10-CM | POA: Diagnosis not present

## 2024-11-05 DIAGNOSIS — M5459 Other low back pain: Secondary | ICD-10-CM | POA: Diagnosis not present

## 2024-11-05 DIAGNOSIS — M7918 Myalgia, other site: Secondary | ICD-10-CM | POA: Diagnosis not present

## 2024-11-05 DIAGNOSIS — M9901 Segmental and somatic dysfunction of cervical region: Secondary | ICD-10-CM | POA: Diagnosis not present

## 2024-11-05 DIAGNOSIS — M9902 Segmental and somatic dysfunction of thoracic region: Secondary | ICD-10-CM | POA: Diagnosis not present

## 2024-11-05 DIAGNOSIS — M9903 Segmental and somatic dysfunction of lumbar region: Secondary | ICD-10-CM | POA: Diagnosis not present

## 2024-11-05 DIAGNOSIS — M542 Cervicalgia: Secondary | ICD-10-CM | POA: Diagnosis not present

## 2024-11-05 DIAGNOSIS — M25512 Pain in left shoulder: Secondary | ICD-10-CM | POA: Diagnosis not present

## 2024-11-05 DIAGNOSIS — M546 Pain in thoracic spine: Secondary | ICD-10-CM | POA: Diagnosis not present

## 2024-11-05 DIAGNOSIS — M25612 Stiffness of left shoulder, not elsewhere classified: Secondary | ICD-10-CM | POA: Diagnosis not present

## 2024-11-05 DIAGNOSIS — M9905 Segmental and somatic dysfunction of pelvic region: Secondary | ICD-10-CM | POA: Diagnosis not present

## 2024-11-12 DIAGNOSIS — M546 Pain in thoracic spine: Secondary | ICD-10-CM | POA: Diagnosis not present

## 2024-11-12 DIAGNOSIS — M9902 Segmental and somatic dysfunction of thoracic region: Secondary | ICD-10-CM | POA: Diagnosis not present

## 2024-11-12 DIAGNOSIS — M5459 Other low back pain: Secondary | ICD-10-CM | POA: Diagnosis not present

## 2024-11-12 DIAGNOSIS — M9905 Segmental and somatic dysfunction of pelvic region: Secondary | ICD-10-CM | POA: Diagnosis not present

## 2024-11-12 DIAGNOSIS — M7918 Myalgia, other site: Secondary | ICD-10-CM | POA: Diagnosis not present

## 2024-11-12 DIAGNOSIS — M7912 Myalgia of auxiliary muscles, head and neck: Secondary | ICD-10-CM | POA: Diagnosis not present

## 2024-11-12 DIAGNOSIS — M9903 Segmental and somatic dysfunction of lumbar region: Secondary | ICD-10-CM | POA: Diagnosis not present

## 2024-11-12 DIAGNOSIS — M542 Cervicalgia: Secondary | ICD-10-CM | POA: Diagnosis not present

## 2024-11-12 DIAGNOSIS — M25612 Stiffness of left shoulder, not elsewhere classified: Secondary | ICD-10-CM | POA: Diagnosis not present

## 2024-11-12 DIAGNOSIS — M25512 Pain in left shoulder: Secondary | ICD-10-CM | POA: Diagnosis not present

## 2024-11-12 DIAGNOSIS — M9901 Segmental and somatic dysfunction of cervical region: Secondary | ICD-10-CM | POA: Diagnosis not present

## 2024-11-30 ENCOUNTER — Encounter

## 2024-12-04 DIAGNOSIS — L2089 Other atopic dermatitis: Secondary | ICD-10-CM | POA: Diagnosis not present

## 2024-12-04 DIAGNOSIS — M5459 Other low back pain: Secondary | ICD-10-CM | POA: Diagnosis not present

## 2024-12-04 DIAGNOSIS — M7918 Myalgia, other site: Secondary | ICD-10-CM | POA: Diagnosis not present

## 2024-12-04 DIAGNOSIS — M9905 Segmental and somatic dysfunction of pelvic region: Secondary | ICD-10-CM | POA: Diagnosis not present

## 2024-12-04 DIAGNOSIS — M25512 Pain in left shoulder: Secondary | ICD-10-CM | POA: Diagnosis not present

## 2024-12-04 DIAGNOSIS — M9902 Segmental and somatic dysfunction of thoracic region: Secondary | ICD-10-CM | POA: Diagnosis not present

## 2024-12-04 DIAGNOSIS — M9901 Segmental and somatic dysfunction of cervical region: Secondary | ICD-10-CM | POA: Diagnosis not present

## 2024-12-04 DIAGNOSIS — M546 Pain in thoracic spine: Secondary | ICD-10-CM | POA: Diagnosis not present

## 2024-12-04 DIAGNOSIS — M25612 Stiffness of left shoulder, not elsewhere classified: Secondary | ICD-10-CM | POA: Diagnosis not present

## 2024-12-04 DIAGNOSIS — M542 Cervicalgia: Secondary | ICD-10-CM | POA: Diagnosis not present

## 2024-12-04 DIAGNOSIS — L02211 Cutaneous abscess of abdominal wall: Secondary | ICD-10-CM | POA: Diagnosis not present

## 2024-12-04 DIAGNOSIS — M7912 Myalgia of auxiliary muscles, head and neck: Secondary | ICD-10-CM | POA: Diagnosis not present

## 2024-12-04 DIAGNOSIS — M9903 Segmental and somatic dysfunction of lumbar region: Secondary | ICD-10-CM | POA: Diagnosis not present

## 2024-12-10 DIAGNOSIS — M7912 Myalgia of auxiliary muscles, head and neck: Secondary | ICD-10-CM | POA: Diagnosis not present

## 2024-12-10 DIAGNOSIS — M9905 Segmental and somatic dysfunction of pelvic region: Secondary | ICD-10-CM | POA: Diagnosis not present

## 2024-12-10 DIAGNOSIS — M9901 Segmental and somatic dysfunction of cervical region: Secondary | ICD-10-CM | POA: Diagnosis not present

## 2024-12-10 DIAGNOSIS — M546 Pain in thoracic spine: Secondary | ICD-10-CM | POA: Diagnosis not present

## 2024-12-10 DIAGNOSIS — M25612 Stiffness of left shoulder, not elsewhere classified: Secondary | ICD-10-CM | POA: Diagnosis not present

## 2024-12-10 DIAGNOSIS — M9902 Segmental and somatic dysfunction of thoracic region: Secondary | ICD-10-CM | POA: Diagnosis not present

## 2024-12-10 DIAGNOSIS — M7918 Myalgia, other site: Secondary | ICD-10-CM | POA: Diagnosis not present

## 2024-12-10 DIAGNOSIS — M5459 Other low back pain: Secondary | ICD-10-CM | POA: Diagnosis not present

## 2024-12-10 DIAGNOSIS — M9903 Segmental and somatic dysfunction of lumbar region: Secondary | ICD-10-CM | POA: Diagnosis not present

## 2024-12-10 DIAGNOSIS — M542 Cervicalgia: Secondary | ICD-10-CM | POA: Diagnosis not present

## 2024-12-10 DIAGNOSIS — M25512 Pain in left shoulder: Secondary | ICD-10-CM | POA: Diagnosis not present
